# Patient Record
Sex: Female | Born: 1995 | Race: Black or African American | Hispanic: No | Marital: Single | Smoking: Former smoker
Health system: Southern US, Community
[De-identification: ages and names within clinical notes are randomized; demographics above are authoritative.]

## PROBLEM LIST (undated history)

## (undated) ENCOUNTER — Inpatient Hospital Stay (HOSPITAL_COMMUNITY): Payer: Self-pay

## (undated) DIAGNOSIS — D649 Anemia, unspecified: Secondary | ICD-10-CM

## (undated) DIAGNOSIS — F32A Depression, unspecified: Secondary | ICD-10-CM

## (undated) DIAGNOSIS — F99 Mental disorder, not otherwise specified: Secondary | ICD-10-CM

## (undated) DIAGNOSIS — F419 Anxiety disorder, unspecified: Secondary | ICD-10-CM

## (undated) DIAGNOSIS — F329 Major depressive disorder, single episode, unspecified: Secondary | ICD-10-CM

## (undated) HISTORY — DX: Depression, unspecified: F32.A

## (undated) HISTORY — DX: Anemia, unspecified: D64.9

## (undated) HISTORY — DX: Mental disorder, not otherwise specified: F99

## (undated) HISTORY — DX: Anxiety disorder, unspecified: F41.9

## (undated) HISTORY — PX: WISDOM TOOTH EXTRACTION: SHX21

---
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
## 2018-03-29 ENCOUNTER — Other Ambulatory Visit (HOSPITAL_COMMUNITY)
Admission: RE | Admit: 2018-03-29 | Discharge: 2018-03-29 | Disposition: A | Discharge status: Home / Self Care | Payer: Medicaid Other | Source: Ambulatory Visit | Attending: Certified Nurse Midwife | Admitting: Certified Nurse Midwife

## 2018-03-29 ENCOUNTER — Ambulatory Visit (INDEPENDENT_AMBULATORY_CARE_PROVIDER_SITE_OTHER): Payer: Medicaid Other | Admitting: Certified Nurse Midwife

## 2018-03-29 ENCOUNTER — Encounter: Payer: Self-pay | Admitting: Certified Nurse Midwife

## 2018-03-29 VITALS — BP 130/80 | HR 103 | Wt 119.0 lb

## 2018-03-29 DIAGNOSIS — A749 Chlamydial infection, unspecified: Secondary | ICD-10-CM | POA: Insufficient documentation

## 2018-03-29 DIAGNOSIS — O0932 Supervision of pregnancy with insufficient antenatal care, second trimester: Secondary | ICD-10-CM

## 2018-03-29 DIAGNOSIS — K219 Gastro-esophageal reflux disease without esophagitis: Secondary | ICD-10-CM

## 2018-03-29 DIAGNOSIS — Z3402 Encounter for supervision of normal first pregnancy, second trimester: Secondary | ICD-10-CM

## 2018-03-29 DIAGNOSIS — Z3400 Encounter for supervision of normal first pregnancy, unspecified trimester: Secondary | ICD-10-CM | POA: Insufficient documentation

## 2018-03-29 DIAGNOSIS — O219 Vomiting of pregnancy, unspecified: Secondary | ICD-10-CM

## 2018-03-29 DIAGNOSIS — O26892 Other specified pregnancy related conditions, second trimester: Secondary | ICD-10-CM

## 2018-03-29 DIAGNOSIS — B3731 Acute candidiasis of vulva and vagina: Secondary | ICD-10-CM

## 2018-03-29 DIAGNOSIS — O99619 Diseases of the digestive system complicating pregnancy, unspecified trimester: Secondary | ICD-10-CM

## 2018-03-29 DIAGNOSIS — B373 Candidiasis of vulva and vagina: Secondary | ICD-10-CM

## 2018-03-29 DIAGNOSIS — Z8659 Personal history of other mental and behavioral disorders: Secondary | ICD-10-CM | POA: Insufficient documentation

## 2018-03-29 DIAGNOSIS — B9689 Other specified bacterial agents as the cause of diseases classified elsewhere: Secondary | ICD-10-CM | POA: Insufficient documentation

## 2018-03-29 DIAGNOSIS — O0930 Supervision of pregnancy with insufficient antenatal care, unspecified trimester: Secondary | ICD-10-CM | POA: Insufficient documentation

## 2018-03-29 DIAGNOSIS — N898 Other specified noninflammatory disorders of vagina: Secondary | ICD-10-CM

## 2018-03-29 DIAGNOSIS — O99612 Diseases of the digestive system complicating pregnancy, second trimester: Secondary | ICD-10-CM

## 2018-03-29 MED ORDER — TERCONAZOLE 0.8 % VA CREA: 1.0000 | TOPICAL_CREAM | Freq: Every day | VAGINAL | 0 refills | Status: DC

## 2018-03-29 MED ORDER — FLUCONAZOLE 150 MG PO TABS: 150.0000 mg | ORAL_TABLET | Freq: Once | ORAL | 0 refills | Status: AC

## 2018-03-29 MED ORDER — TINIDAZOLE 500 MG PO TABS: 2.0000 g | ORAL_TABLET | Freq: Every day | ORAL | 0 refills | Status: DC

## 2018-03-29 MED ORDER — OMEPRAZOLE 20 MG PO CPDR: 20.0000 mg | DELAYED_RELEASE_CAPSULE | Freq: Two times a day (BID) | ORAL | 5 refills | Status: DC

## 2018-03-29 MED ORDER — OB COMPLETE PETITE 35-5-1-200 MG PO CAPS
1.0000 | ORAL_CAPSULE | Freq: Every day | ORAL | 12 refills | Status: DC
Start: 2018-03-29 — End: 2019-02-01

## 2018-03-29 MED ORDER — DOXYLAMINE-PYRIDOXINE 10-10 MG PO TBEC: DELAYED_RELEASE_TABLET | ORAL | 4 refills | Status: DC

## 2018-03-29 NOTE — Progress Notes (Signed)
Subjective:   Catherine Collins is a 22 y.o. G1P0 at [redacted]w[redacted]d by LMP being seen today for her first obstetrical visit.  Her obstetrical history is significant for hx of depression was on effexor and seraquil, stopped when she found out she was pregnant, denies depression symptoms today.  Hx of tobacco abuse prior to pregnancy as well. Patient does intend to breast feed. Pregnancy history fully reviewed.  Patient reports heartburn, nausea, no bleeding, no contractions, no cramping, no leaking, vaginal irritation and vomiting.  HISTORY: OB History  Gravida Para Term Preterm AB Living  1 0 0 0 0 0  SAB TAB Ectopic Multiple Live Births  0 0 0 0 0    # Outcome Date GA Lbr Len/2nd Weight Sex Delivery Anes PTL Lv  1 Current             Last pap smear was done n/a <21 years.   Past Medical History:  Diagnosis Date  . Medical history non-contributory   . Mental disorder    Past Surgical History:  Procedure Laterality Date  . NO PAST SURGERIES     Family History  Problem Relation Age of Onset  . Drug abuse Maternal Grandfather    Social History   Tobacco Use  . Smoking status: Current Every Day Smoker    Packs/day: 0.50    Types: Cigarettes  . Smokeless tobacco: Never Used  . Tobacco comment: stopped smoking after preg confirm  Substance Use Topics  . Alcohol use: No    Alcohol/week: 0.0 oz  . Drug use: No   No Known Allergies Current Outpatient Medications on File Prior to Visit  Medication Sig Dispense Refill  . QUEtiapine (SEROQUEL) 200 MG tablet Take 200 mg by mouth at bedtime.    Marland Kitchen venlafaxine XR (EFFEXOR-XR) 150 MG 24 hr capsule Take 150 mg by mouth daily with breakfast.     No current facility-administered medications on file prior to visit.     Review of Systems Pertinent items noted in HPI and remainder of comprehensive ROS otherwise negative.  Exam   Vitals:   03/29/18 1038  BP: 130/80  Pulse: (!) 103  Weight: 119 lb (54 kg)   Fetal Heart Rate (bpm):  160; doppler  Uterus:  Fundal Height: 17 cm  Pelvic Exam: Perineum: no hemorrhoids, normal perineum   Vulva: normal external genitalia, no lesions   Vagina:  normal mucosa, copious green/yello chunky discharge   Cervix: No CMT    Adnexa: normal adnexa and no mass, fullness, tenderness   Bony Pelvis: average  System: General: well-developed, well-nourished female in no acute distress   Breast:  normal appearance, no masses or tenderness   Skin: normal coloration and turgor, no rashes   Neurologic: oriented, normal, negative, normal mood   Extremities: normal strength, tone, and muscle mass, ROM of all joints is normal   HEENT PERRLA, extraocular movement intact and sclera clear, anicteric   Mouth/Teeth mucous membranes moist, pharynx normal without lesions and dental hygiene good   Neck supple and no masses   Cardiovascular: regular rate and rhythm   Respiratory:  no respiratory distress, normal breath sounds   Abdomen: soft, non-tender; bowel sounds normal; no masses,  no organomegaly     Assessment:   Pregnancy: G1P0 Patient Active Problem List   Diagnosis Date Noted  . History of depression 03/29/2018  . Supervision of normal first pregnancy, antepartum 03/29/2018  . Late prenatal care 03/29/2018     Plan:  1. Supervision of normal first pregnancy, antepartum    - Cervicovaginal ancillary only - Hemoglobinopathy evaluation - VITAMIN D 25 Hydroxy (Vit-D Deficiency, Fractures) - Culture, OB Urine - Obstetric Panel, Including HIV - Hemoglobin A1c - Genetic Screening - Inheritest Core(CF97,SMA,FraX) - [place] MFM OB COMP + 14 WK; Future - AFP, Serum, Open Spina Bifida - Prenat-FeCbn-FeAspGl-FA-Omega (OB COMPLETE PETITE) 35-5-1-200 MG CAPS; Take 1 tablet by mouth daily.  Dispense: 30 capsule; Refill: 12  2. History of depression      3. Late prenatal care    17 weeks  4. Nausea/vomiting in pregnancy      - Doxylamine-Pyridoxine (DICLEGIS) 10-10 MG TBEC; Take 1 tablet  with breakfast and lunch.  Take 2 tablets at bedtime.  Dispense: 100 tablet; Refill: 4  5. Gastroesophageal reflux in pregnancy    - omeprazole (PRILOSEC) 20 MG capsule; Take 1 capsule (20 mg total) by mouth 2 (two) times daily before a meal.  Dispense: 60 capsule; Refill: 5  6. Yeast vaginitis    - fluconazole (DIFLUCAN) 150 MG tablet; Take 1 tablet (150 mg total) by mouth once for 1 dose.  Dispense: 1 tablet; Refill: 0 - terconazole (TERAZOL 3) 0.8 % vaginal cream; Place 1 applicator vaginally at bedtime.  Dispense: 20 g; Refill: 0  7. Vaginal discharge during pregnancy in second trimester    - tinidazole (TINDAMAX) 500 MG tablet; Take 4 tablets (2,000 mg total) by mouth daily with breakfast.  Dispense: 12 tablet; Refill: 0   Initial labs drawn. Continue prenatal vitamins. Genetic Screening discussed, NIPS: ordered. Ultrasound discussed; fetal anatomic survey: ordered. Problem list reviewed and updated. The nature of [facility] with multiple MDs and other Advanced Practice Providers was explained to patient; also emphasized that residents, students are part of our team. Routine obstetric precautions reviewed. Return in about 1 month (around 04/26/2018) for ROB.     [facility], [facility]

## 2018-03-30 LAB — CERVICOVAGINAL ANCILLARY ONLY
BACTERIAL VAGINITIS: POSITIVE — AB
CANDIDA VAGINITIS: NEGATIVE
Chlamydia: POSITIVE — AB
NEISSERIA GONORRHEA: POSITIVE — AB
TRICH (WINDOWPATH): NEGATIVE

## 2018-03-31 LAB — OBSTETRIC PANEL, INCLUDING HIV
ANTIBODY SCREEN: NEGATIVE
BASOS: 0 %
Basophils Absolute: 0 10*3/uL (ref 0.0–0.2)
EOS (ABSOLUTE): 0.2 10*3/uL (ref 0.0–0.4)
EOS: 1 %
HEMATOCRIT: 35.3 % (ref 34.0–46.6)
HEMOGLOBIN: 11.9 g/dL (ref 11.1–15.9)
HEP B S AG: NEGATIVE
HIV Screen 4th Generation wRfx: NONREACTIVE
IMMATURE GRANS (ABS): 0 10*3/uL (ref 0.0–0.1)
IMMATURE GRANULOCYTES: 0 %
Lymphocytes Absolute: 1.4 10*3/uL (ref 0.7–3.1)
Lymphs: 11 %
MCH: 31.3 pg (ref 26.6–33.0)
MCHC: 33.7 g/dL (ref 31.5–35.7)
MCV: 93 fL (ref 79–97)
MONOS ABS: 0.8 10*3/uL (ref 0.1–0.9)
Monocytes: 6 %
NEUTROS PCT: 82 %
Neutrophils Absolute: 10.5 10*3/uL — ABNORMAL HIGH (ref 1.4–7.0)
Platelets: 289 10*3/uL (ref 150–379)
RBC: 3.8 x10E6/uL (ref 3.77–5.28)
RDW: 13.3 % (ref 12.3–15.4)
RH TYPE: POSITIVE
RPR: NONREACTIVE
RUBELLA: 1.19 {index} (ref >0.99)
WBC: 12.9 10*3/uL — ABNORMAL HIGH (ref 3.4–10.8)

## 2018-03-31 LAB — AFP, SERUM, OPEN SPINA BIFIDA
AFP MOM: 1.17
AFP VALUE AFPOSL: 62.9 ng/mL
Gest. Age on Collection Date: 17.9 weeks
Maternal Age At EDD: 20 yr
OSBR RISK 1 IN: 10000
Test Results:: NEGATIVE
WEIGHT: 119 [lb_av]

## 2018-03-31 LAB — HEMOGLOBIN A1C
Est. average glucose Bld gHb Est-mCnc: 94 mg/dL
Hgb A1c MFr Bld: 4.9 % (ref 4.8–5.6)

## 2018-03-31 LAB — HEMOGLOBINOPATHY EVALUATION
HGB C: 0 %
HGB S: 0 %
HGB VARIANT: 0 %
Hemoglobin A2 Quantitation: 2.4 % (ref 1.8–3.2)
Hemoglobin F Quantitation: 0 % (ref 0.0–2.0)
Hgb A: 97.6 % (ref 96.4–98.8)

## 2018-03-31 LAB — VITAMIN D 25 HYDROXY (VIT D DEFICIENCY, FRACTURES): Vit D, 25-Hydroxy: 24.3 ng/mL — ABNORMAL LOW (ref 30.0–100.0)

## 2018-04-01 ENCOUNTER — Telehealth: Payer: Self-pay

## 2018-04-01 ENCOUNTER — Other Ambulatory Visit: Payer: Self-pay | Admitting: Certified Nurse Midwife

## 2018-04-01 DIAGNOSIS — Z3400 Encounter for supervision of normal first pregnancy, unspecified trimester: Secondary | ICD-10-CM

## 2018-04-01 LAB — CULTURE, OB URINE

## 2018-04-01 LAB — URINE CULTURE, OB REFLEX

## 2018-04-01 NOTE — Telephone Encounter (Signed)
PA Approved for Diclegis  PA# [id number]

## 2018-04-06 ENCOUNTER — Telehealth: Payer: Self-pay | Admitting: *Deleted

## 2018-04-06 NOTE — Telephone Encounter (Signed)
PA for Diclegis and Tinidazole have been submitted and approved. Pharmacy aware.

## 2018-04-07 ENCOUNTER — Other Ambulatory Visit: Payer: Self-pay | Admitting: Certified Nurse Midwife

## 2018-04-07 ENCOUNTER — Other Ambulatory Visit: Payer: Self-pay | Admitting: *Deleted

## 2018-04-07 DIAGNOSIS — O285 Abnormal chromosomal and genetic finding on antenatal screening of mother: Secondary | ICD-10-CM

## 2018-04-07 DIAGNOSIS — Z3400 Encounter for supervision of normal first pregnancy, unspecified trimester: Secondary | ICD-10-CM

## 2018-04-07 LAB — INHERITEST CORE(CF97,SMA,FRAX)

## 2018-04-08 ENCOUNTER — Ambulatory Visit (HOSPITAL_COMMUNITY)
Admission: RE | Admit: 2018-04-08 | Discharge: 2018-04-08 | Disposition: A | Discharge status: Home / Self Care | Payer: Medicaid Other | Source: Ambulatory Visit | Attending: Certified Nurse Midwife | Admitting: Certified Nurse Midwife

## 2018-04-08 ENCOUNTER — Encounter (HOSPITAL_COMMUNITY): Payer: Self-pay

## 2018-04-08 ENCOUNTER — Other Ambulatory Visit: Payer: Self-pay | Admitting: Certified Nurse Midwife

## 2018-04-08 DIAGNOSIS — O285 Abnormal chromosomal and genetic finding on antenatal screening of mother: Secondary | ICD-10-CM

## 2018-04-08 DIAGNOSIS — Z315 Encounter for genetic counseling: Secondary | ICD-10-CM | POA: Diagnosis present

## 2018-04-08 DIAGNOSIS — Z363 Encounter for antenatal screening for malformations: Secondary | ICD-10-CM | POA: Insufficient documentation

## 2018-04-08 DIAGNOSIS — Z3A15 15 weeks gestation of pregnancy: Secondary | ICD-10-CM | POA: Diagnosis not present

## 2018-04-08 DIAGNOSIS — O289 Unspecified abnormal findings on antenatal screening of mother: Secondary | ICD-10-CM

## 2018-04-11 ENCOUNTER — Other Ambulatory Visit: Payer: Self-pay | Admitting: Certified Nurse Midwife

## 2018-04-11 DIAGNOSIS — O289 Unspecified abnormal findings on antenatal screening of mother: Secondary | ICD-10-CM | POA: Insufficient documentation

## 2018-04-11 DIAGNOSIS — Z3A19 19 weeks gestation of pregnancy: Secondary | ICD-10-CM | POA: Insufficient documentation

## 2018-04-11 DIAGNOSIS — Z3400 Encounter for supervision of normal first pregnancy, unspecified trimester: Secondary | ICD-10-CM

## 2018-04-11 NOTE — Progress Notes (Signed)
Genetic Counseling  High-Risk Gestation Note  Appointment Date:  04/08/2018 Referred By: Roe Coombs, CNM Date of Birth:  11/21/1996   Pregnancy History: G1P0 Estimated Date of Delivery: 08/31/18 Estimated Gestational Age: [redacted]w[redacted]d Attending: Particia Nearing, MD   [name] was seen for genetic counseling because of no results on noninvasive prenatal screening (NIPS)/prenatal cell free DNA testing due to uninformative DNA pattern. [name] is 22 y.o..      In summary:  Discussed uninformative NIPS (Panorama) result  Reviewed causes and alternative screening options ? NIPS using counting methodology-MaterniT 21 performed today  Discussed diagnostic testing- amniocentesis declined  Ultrasound performed today- see separate report  Discussed general population carrier screening options ? CF, SMA, and hemoglobinopathies-previously performed and screen neg  Catherine Collins had noninvasive prenatal screening (NIPS) through her referring provider's office. Specifically, she had Panorama screening through [place]. A result was not provided due to an uninformative DNA pattern. We spent time discussing genes, chromosomes, and examples of chromosome conditions. We reviewed NIPS technology and that the accuracy of this testing relies on the ability to distinguish specific DNA sequence variations between the mother and the fetus (placental cell free DNA). We discussed that in some cases, it is difficult to distinguish a difference between maternal and placental DNA sequences, making it difficult to interpret the results. This can happen because the mother and the fetus share more DNA sequences in common than expected, possibly due to general population variation or in cases of consanguinity for couples. Uninformative DNA patterns can also be observed when there is an underlying chromosome/genetic condition in either the pregnancy or the woman or due to limitations of the testing  algorithm.   She was counseled regarding maternal age and the association with risk for chromosome conditions due to nondisjunction with aging of the ova.   We reviewed chromosomes, nondisjunction, and the associated risk for fetal aneuploidy related to a maternal age of 22 y.o..  She was counseled that the risk for aneuploidy decreases as gestational age increases, accounting for those pregnancies which spontaneously abort.  We specifically discussed Down syndrome (trisomy 20), trisomies 22 and 59, and sex chromosome aneuploidies (47,XXX and 47,XXY) including the common features and prognoses of each.   We reviewed that [facility] uses a Therapist, art Programmer, multimedia) and that other laboratories [employer], [employer] etc) use a Psychologist, clinical (counting), where the individual fragments of DNA are analyzed in slightly different ways. We discussed additional screening for aneuploidy utilizing a different NIPS technology and detailed anatomy ultrasound. We reviewed the limitations and benefits of each of these options. We also discussed the option of diagnostic testing via amniocentesis. We reviewed the associated risks, benefits andlimitationsof amniocentesis, including the associated risk for complications.  We discussed the possible results that the tests might provide including: positive, negative, unanticipated, and no result.  After careful consideration, Catherine Collins elected to pursue NIPS through different methodology (MaterniT21 through [employer]) and declined amniocentesis. MaterniT21 results are expected in approximately 7-10 days.  She elected to proceed with detailed ultrasound at today's visit.   A complete ultrasound was performed today. The ultrasound report will be sent under separate cover. There were no visualized fetal anomalies or markers suggestive of aneuploidy. Diagnostic testing was declined today.  She understands that screening tests cannot rule out all birth defects or  genetic syndromes.    Catherine Collins  was provided with written information regarding cystic fibrosis (CF), spinal muscular atrophy (SMA) and hemoglobinopathies  including the carrier frequency, availability of carrier screening and prenatal diagnosis if indicated.  In addition, we discussed that CF and hemoglobinopathies are routinely screened for as part of the [place] newborn screening panel. The patient previously had carrier screening performed for these conditions and Fragile X through her Ob provider, and all were within normal limits.   Both family histories were reviewed and found to be noncontributory for birth defects, intellectual disability, and known genetic conditions. African American ancestry was reported for the patient and her partner. The couple reported no known consanguinity. Without further information regarding the provided family history, an accurate genetic risk cannot be calculated. Further genetic counseling is warranted if more information is obtained.  Catherine Collins denied exposure to environmental toxins or chemical agents. She denied the use of alcohol, tobacco or street drugs. She denied significant viral illnesses during the course of her pregnancy.   I counseled Catherine Collins regarding the above risks and available options.  The approximate face-to-face time with the genetic counselor was 40 minutes.  [name], MS,  Certified Genetic Counselor 04/11/2018

## 2018-04-13 ENCOUNTER — Other Ambulatory Visit: Payer: Self-pay | Admitting: Certified Nurse Midwife

## 2018-04-13 DIAGNOSIS — Z3400 Encounter for supervision of normal first pregnancy, unspecified trimester: Secondary | ICD-10-CM

## 2018-04-18 ENCOUNTER — Other Ambulatory Visit (HOSPITAL_COMMUNITY): Payer: Self-pay

## 2018-04-18 ENCOUNTER — Telehealth (HOSPITAL_COMMUNITY): Payer: Self-pay | Admitting: MS"

## 2018-04-18 NOTE — Telephone Encounter (Signed)
CallDELANDA BULLUCKFraser to discuss her prenatal cell free DNA test results.  [name] had MaterniT21 through [employer].  Testing was offered because of uninformative DNA pattern on previous SNP-based NIPS (Panorama).   The patient was identified by name and DOB.  We reviewed that these are within normal limits/screen negative for aneuploidy for chromosomes 21, 18, 13, and X.  We reviewed that this testing identifies > 99% of pregnancies with trisomy 80, trisomy 23, and approximately 91% of pregnancies with trisomy 30.  Testing was also consistent with female fetal sex.  The patient did wish to know fetal sex.  She understands that this testing does not identify all genetic conditions.  All questions were answered to her satisfaction, she was encouraged to call with additional questions or concerns.  [name], MS Certified Genetic Counselor 04/18/2018 11:52 AM

## 2018-04-18 NOTE — Telephone Encounter (Signed)
Attempted to contact patient regarding results of MaterniT21 (NIPS), which are within normal limits. Left message for patient to return call.    Clydie Braun Shaunak Kreis  04/18/2018 11:04 AM

## 2018-04-20 NOTE — Addendum Note (Signed)
Encounter addended by: Heidi Dach, RN on: 04/20/2018 1:56 PM  Actions taken: OB Dating navigator section updated

## 2018-04-26 ENCOUNTER — Ambulatory Visit (INDEPENDENT_AMBULATORY_CARE_PROVIDER_SITE_OTHER): Payer: Medicaid Other | Admitting: Certified Nurse Midwife

## 2018-04-26 DIAGNOSIS — Z3400 Encounter for supervision of normal first pregnancy, unspecified trimester: Secondary | ICD-10-CM

## 2018-04-26 NOTE — Progress Notes (Signed)
   PRENATAL VISIT NOTE  Subjective:  Catherine Collins is a 22 y.o. G1P0 at [redacted]w[redacted]d being seen today for ongoing prenatal care.  She is currently monitored for the following issues for this low-risk pregnancy and has History of depression; Supervision of normal first pregnancy, antepartum; Late prenatal care; and Abnormal antenatal test on their problem list.  Patient reports no complaints.  Contractions: Not present. Vag. Bleeding: None.  Movement: Present. Denies leaking of fluid.   The following portions of the patient's history were reviewed and updated as appropriate: allergies, current medications, past family history, past medical history, past social history, past surgical history and problem list. Problem list updated.  Objective:   Vitals:   04/26/18 1046  BP: 124/78  Pulse: (!) 110  Weight: 120 lb 4.8 oz (54.6 kg)    Fetal Status: Fetal Heart Rate (bpm): 155; doppler   Movement: Present     General:  Alert, oriented and cooperative. Patient is in no acute distress.  Skin: Skin is warm and dry. No rash noted.   Cardiovascular: Normal heart rate noted  Respiratory: Normal respiratory effort, no problems with respiration noted  Abdomen: Soft, gravid, appropriate for gestational age.  Pain/Pressure: Absent     Pelvic: Cervical exam deferred        Extremities: Normal range of motion.  Edema: None  Mental Status: Normal mood and affect. Normal behavior. Normal judgment and thought content.   Assessment and Plan:  Pregnancy: G1P0 at [redacted]w[redacted]d  1. Supervision of normal first pregnancy, antepartum      Doing well.  Normal Mat21.  F/U US scheduled.  Dating previously changed.  Patient aware of new due date.    Preterm labor symptoms and general obstetric precautions including but not limited to vaginal bleeding, contractions, leaking of fluid and fetal movement were reviewed in detail with the patient. Please refer to After Visit Summary for other counseling recommendations.  Return  in about 1 month (around 05/24/2018) for ROB.  Future Appointments  Date Time Provider Department Center  05/16/2018  1:15 PM [facility] [place] 4 [facility]    Roe Coombs, CNM

## 2018-05-16 ENCOUNTER — Other Ambulatory Visit: Payer: Self-pay | Admitting: Certified Nurse Midwife

## 2018-05-16 ENCOUNTER — Ambulatory Visit (HOSPITAL_COMMUNITY)
Admission: RE | Admit: 2018-05-16 | Discharge: 2018-05-16 | Disposition: A | Discharge status: Home / Self Care | Payer: Medicaid Other | Source: Ambulatory Visit | Attending: Certified Nurse Midwife | Admitting: Certified Nurse Midwife

## 2018-05-16 DIAGNOSIS — Z0489 Encounter for examination and observation for other specified reasons: Secondary | ICD-10-CM

## 2018-05-16 DIAGNOSIS — Z3A20 20 weeks gestation of pregnancy: Secondary | ICD-10-CM

## 2018-05-16 DIAGNOSIS — IMO0002 Reserved for concepts with insufficient information to code with codable children: Secondary | ICD-10-CM

## 2018-05-16 DIAGNOSIS — Z362 Encounter for other antenatal screening follow-up: Secondary | ICD-10-CM | POA: Diagnosis not present

## 2018-05-16 DIAGNOSIS — Z3400 Encounter for supervision of normal first pregnancy, unspecified trimester: Secondary | ICD-10-CM

## 2018-05-17 ENCOUNTER — Other Ambulatory Visit: Payer: Self-pay | Admitting: Certified Nurse Midwife

## 2018-05-17 DIAGNOSIS — Z3400 Encounter for supervision of normal first pregnancy, unspecified trimester: Secondary | ICD-10-CM

## 2018-05-20 ENCOUNTER — Telehealth: Payer: Self-pay | Admitting: Licensed Clinical Social Worker

## 2018-05-20 NOTE — Telephone Encounter (Signed)
Patient called. Appt confirmed

## 2018-05-24 ENCOUNTER — Ambulatory Visit (INDEPENDENT_AMBULATORY_CARE_PROVIDER_SITE_OTHER): Payer: Medicaid Other | Admitting: Certified Nurse Midwife

## 2018-05-24 VITALS — BP 112/75 | HR 105 | Wt 126.0 lb

## 2018-05-24 DIAGNOSIS — O98812 Other maternal infectious and parasitic diseases complicating pregnancy, second trimester: Secondary | ICD-10-CM

## 2018-05-24 DIAGNOSIS — B9689 Other specified bacterial agents as the cause of diseases classified elsewhere: Secondary | ICD-10-CM

## 2018-05-24 DIAGNOSIS — O98212 Gonorrhea complicating pregnancy, second trimester: Secondary | ICD-10-CM

## 2018-05-24 DIAGNOSIS — A749 Chlamydial infection, unspecified: Secondary | ICD-10-CM | POA: Insufficient documentation

## 2018-05-24 DIAGNOSIS — Z3400 Encounter for supervision of normal first pregnancy, unspecified trimester: Secondary | ICD-10-CM

## 2018-05-24 DIAGNOSIS — N760 Acute vaginitis: Secondary | ICD-10-CM

## 2018-05-24 DIAGNOSIS — Z3402 Encounter for supervision of normal first pregnancy, second trimester: Secondary | ICD-10-CM

## 2018-05-24 MED ORDER — TERCONAZOLE 0.8 % VA CREA: 1.0000 | TOPICAL_CREAM | Freq: Every day | VAGINAL | 0 refills | Status: DC

## 2018-05-24 MED ORDER — AZITHROMYCIN 250 MG PO TABS: ORAL_TABLET | ORAL | 0 refills | Status: DC

## 2018-05-24 MED ORDER — METRONIDAZOLE 500 MG PO TABS
500.0000 mg | ORAL_TABLET | Freq: Two times a day (BID) | ORAL | 0 refills | Status: DC
Start: 2018-05-24 — End: 2018-06-21

## 2018-05-24 MED ORDER — CEFTRIAXONE SODIUM 250 MG IJ SOLR
250.0000 mg | Freq: Once | INTRAMUSCULAR | Status: AC
  Administered 2018-05-24: 250 mg via INTRAMUSCULAR

## 2018-05-24 NOTE — Patient Instructions (Signed)
Bacterial Vaginosis Bacterial vaginosis is a vaginal infection that occurs when the normal balance of bacteria in the vagina is disrupted. It results from an overgrowth of certain bacteria. This is the most common vaginal infection among women ages 15-44. Because bacterial vaginosis increases your risk for STIs (sexually transmitted infections), getting treated can help reduce your risk for chlamydia, gonorrhea, herpes, and HIV (human immunodeficiency virus). Treatment is also important for preventing complications in pregnant women, because this condition can cause an early (premature) delivery. What are the causes? This condition is caused by an increase in harmful bacteria that are normally present in small amounts in the vagina. However, the reason that the condition develops is not fully understood. What increases the risk? The following factors may make you more likely to develop this condition:  Having a new sexual partner or multiple sexual partners.  Having unprotected sex.  Douching.  Having an intrauterine device (IUD).  Smoking.  Drug and alcohol abuse.  Taking certain antibiotic medicines.  Being pregnant.  You cannot get bacterial vaginosis from toilet seats, bedding, swimming pools, or contact with objects around you. What are the signs or symptoms? Symptoms of this condition include:  Grey or white vaginal discharge. The discharge can also be watery or foamy.  A fish-like odor with discharge, especially after sexual intercourse or during menstruation.  Itching in and around the vagina.  Burning or pain with urination.  Some women with bacterial vaginosis have no signs or symptoms. How is this diagnosed? This condition is diagnosed based on:  Your medical history.  A physical exam of the vagina.  Testing a sample of vaginal fluid under a microscope to look for a large amount of bad bacteria or abnormal cells. Your health care provider may use a cotton swab  or a small wooden spatula to collect the sample.  How is this treated? This condition is treated with antibiotics. These may be given as a pill, a vaginal cream, or a medicine that is put into the vagina (suppository). If the condition comes back after treatment, a second round of antibiotics may be needed. Follow these instructions at home: Medicines  Take over-the-counter and prescription medicines only as told by your health care provider.  Take or use your antibiotic as told by your health care provider. Do not stop taking or using the antibiotic even if you start to feel better. General instructions  If you have a female sexual partner, tell her that you have a vaginal infection. She should see her health care provider and be treated if she has symptoms. If you have a female sexual partner, he does not need treatment.  During treatment: ? Avoid sexual activity until you finish treatment. ? Do not douche. ? Avoid alcohol as directed by your health care provider. ? Avoid breastfeeding as directed by your health care provider.  Drink enough water and fluids to keep your urine clear or pale yellow.  Keep the area around your vagina and rectum clean. ? Wash the area daily with warm water. ? Wipe yourself from front to back after using the toilet.  Keep all follow-up visits as told by your health care provider. This is important. How is this prevented?  Do not douche.  Wash the outside of your vagina with warm water only.  Use protection when having sex. This includes latex condoms and dental dams.  Limit how many sexual partners you have. To help prevent bacterial vaginosis, it is best to have sex with just   one partner (monogamous).  Make sure you and your sexual partner are tested for STIs.  Wear cotton or cotton-lined underwear.  Avoid wearing tight pants and pantyhose, especially during summer.  Limit the amount of alcohol that you drink.  Do not use any products that  contain nicotine or tobacco, such as cigarettes and e-cigarettes. If you need help quitting, ask your health care provider.  Do not use illegal drugs. Where to find more information:  Centers for Disease Control and Prevention: www.cdc.gov/std  American Sexual Health Association (ASHA): www.ashastd.org  U.[place] Department of Health and Human Services, Office on Women's Health: www.womenshealth.gov/ or https://www.womenshealth.gov/a-z-topics/bacterial-vaginosis Contact a health care provider if:  Your symptoms do not improve, even after treatment.  You have more discharge or pain when urinating.  You have a fever.  You have pain in your abdomen.  You have pain during sex.  You have vaginal bleeding between periods. Summary  Bacterial vaginosis is a vaginal infection that occurs when the normal balance of bacteria in the vagina is disrupted.  Because bacterial vaginosis increases your risk for STIs (sexually transmitted infections), getting treated can help reduce your risk for chlamydia, gonorrhea, herpes, and HIV (human immunodeficiency virus). Treatment is also important for preventing complications in pregnant women, because the condition can cause an early (premature) delivery.  This condition is treated with antibiotic medicines. These may be given as a pill, a vaginal cream, or a medicine that is put into the vagina (suppository). This information is not intended to replace advice given to you by your health care provider. Make sure you discuss any questions you have with your health care provider. Document Released: 11/16/2005 Document Revised: 03/22/2017 Document Reviewed: 08/01/2016 Elsevier Interactive Patient Education  2018 Elsevier Inc.  

## 2018-05-24 NOTE — Addendum Note (Signed)
Addended byFrutoso Chase: [facility], Othar Curto on: 05/24/2018 10:53 AM   Modules accepted: Orders

## 2018-05-24 NOTE — Addendum Note (Signed)
Addended by: Orvilla CornwallENNEY, Emylie Amster A on: 05/24/2018 10:47 AM   Modules accepted: Orders

## 2018-05-24 NOTE — Progress Notes (Addendum)
   PRENATAL VISIT NOTE  Subjective:  Catherine Collins Reason is a 22 y.o. G1P0 at 4028w0d being seen today for ongoing prenatal care.  She is currently monitored for the following issues for this low-risk pregnancy and has History of depression; Supervision of normal first pregnancy, antepartum; Late prenatal care; Chlamydia infection affecting pregnancy in second trimester; and Gonorrhea affecting pregnancy in second trimester on their problem list.  Patient reports no bleeding, no contractions, no cramping, no leaking and vaginal irritation.  Contractions: Not present. Vag. Bleeding: None.  Movement: Present. Denies leaking of fluid.   The following portions of the patient's history were reviewed and updated as appropriate: allergies, current medications, past family history, past medical history, past social history, past surgical history and problem list. Problem list updated.  Objective:   Vitals:   05/24/18 1017  BP: 112/75  Pulse: (!) 105  Weight: 126 lb (57.2 kg)    Fetal Status: Fetal Heart Rate (bpm): 152; doppler Fundal Height: 22 cm Movement: Present     General:  Alert, oriented and cooperative. Patient is in no acute distress.  Skin: Skin is warm and dry. No rash noted.   Cardiovascular: Normal heart rate noted  Respiratory: Normal respiratory effort, no problems with respiration noted  Abdomen: Soft, gravid, appropriate for gestational age.  Pain/Pressure: Absent     Pelvic: Cervical exam deferred        Extremities: Normal range of motion.  Edema: None  Mental Status: Normal mood and affect. Normal behavior. Normal judgment and thought content.   Assessment and Plan:  Pregnancy: G1P0 at 5928w0d  1. Chlamydia infection affecting pregnancy in second trimester     TOC in 4 weeks - azithromycin (ZITHROMAX) 250 MG tablet; Take 4 tablets all together now.  Dispense: 4 tablet; Refill: 0  2. Supervision of normal first pregnancy, antepartum       3. Gonorrhea affecting pregnancy  in second trimester     TOC in 4 weeks - azithromycin (ZITHROMAX) 250 MG tablet; Take 4 tablets all together now.  Dispense: 4 tablet; Refill: 0  Rocephin given in office.   4. BV (bacterial vaginosis)     Did not keep Tindamax down, re-treatment.   - metroNIDAZOLE (FLAGYL) 500 MG tablet; Take 1 tablet (500 mg total) by mouth 2 (two) times daily.  Dispense: 14 tablet; Refill: 0  Preterm labor symptoms and general obstetric precautions including but not limited to vaginal bleeding, contractions, leaking of fluid and fetal movement were reviewed in detail with the patient. Please refer to After Visit Summary for other counseling recommendations.  Return in about 1 month (around 06/21/2018) for ROB/TOC.  Future Appointments  Date Time Provider Department Center  06/21/2018 10:45 AM Roe Coombsenney, Alexey Rhoads A, CNM [facility] None    Roe Coombsachelle A Kharlie Bring, CNM

## 2018-06-21 ENCOUNTER — Other Ambulatory Visit (HOSPITAL_COMMUNITY)
Admission: RE | Admit: 2018-06-21 | Discharge: 2018-06-21 | Disposition: A | Discharge status: Home / Self Care | Payer: Medicaid Other | Source: Ambulatory Visit | Attending: Obstetrics | Admitting: Obstetrics

## 2018-06-21 ENCOUNTER — Ambulatory Visit (INDEPENDENT_AMBULATORY_CARE_PROVIDER_SITE_OTHER): Payer: Medicaid Other | Admitting: Obstetrics

## 2018-06-21 ENCOUNTER — Encounter: Payer: Self-pay | Admitting: Obstetrics

## 2018-06-21 VITALS — BP 127/74 | HR 99 | Wt 134.0 lb

## 2018-06-21 DIAGNOSIS — Z202 Contact with and (suspected) exposure to infections with a predominantly sexual mode of transmission: Secondary | ICD-10-CM

## 2018-06-21 DIAGNOSIS — N898 Other specified noninflammatory disorders of vagina: Secondary | ICD-10-CM | POA: Insufficient documentation

## 2018-06-21 DIAGNOSIS — Z3400 Encounter for supervision of normal first pregnancy, unspecified trimester: Secondary | ICD-10-CM

## 2018-06-21 DIAGNOSIS — K219 Gastro-esophageal reflux disease without esophagitis: Secondary | ICD-10-CM

## 2018-06-21 DIAGNOSIS — Z3402 Encounter for supervision of normal first pregnancy, second trimester: Secondary | ICD-10-CM

## 2018-06-21 DIAGNOSIS — Z8659 Personal history of other mental and behavioral disorders: Secondary | ICD-10-CM

## 2018-06-21 MED ORDER — OMEPRAZOLE 20 MG PO CPDR: 20.0000 mg | DELAYED_RELEASE_CAPSULE | Freq: Two times a day (BID) | ORAL | 5 refills | Status: DC

## 2018-06-21 NOTE — Progress Notes (Addendum)
Subjective:  Catherine Collins is a 22 y.o. G1P0 at 8817w0d being seen today for ongoing prenatal care.  She is currently monitored for the following issues for this high-risk pregnancy and has History of depression; Supervision of normal first pregnancy, antepartum; Late prenatal care; Chlamydia infection affecting pregnancy in second trimester; and Gonorrhea affecting pregnancy in second trimester on their problem list.  Patient reports heartburn.  Contractions: Not present. Vag. Bleeding: None.  Movement: Present. Denies leaking of fluid.   The following portions of the patient's history were reviewed and updated as appropriate: allergies, current medications, past family history, past medical history, past social history, past surgical history and problem list. Problem list updated.  Objective:   Vitals:   06/21/18 1055  BP: 127/74  Pulse: 99  Weight: 134 lb (60.8 kg)    Fetal Status: Fetal Heart Rate (bpm): 150   Movement: Present     General:  Alert, oriented and cooperative. Patient is in no acute distress.  Skin: Skin is warm and dry. No rash noted.   Cardiovascular: Normal heart rate noted  Respiratory: Normal respiratory effort, no problems with respiration noted  Abdomen: Soft, gravid, appropriate for gestational age. Pain/Pressure: Absent     Pelvic:  Cervical exam deferred        Extremities: Normal range of motion.     Mental Status: Normal mood and affect. Normal behavior. Normal judgment and thought content.   Urinalysis:      Assessment and Plan:  Pregnancy: G1P0 at 5717w0d  1. Supervision of normal first pregnancy, antepartum  2. Vaginal discharge Rx: - Cervicovaginal ancillary only  3. GERD without esophagitis Rx: - omeprazole (PRILOSEC) 20 MG capsule; Take 1 capsule (20 mg total) by mouth 2 (two) times daily before a meal.  Dispense: 60 capsule; Refill: 5  4. Chlamydia contact, treated - TOC culture done today  5. Gonorrhea contact, treated - TOC culture  done today  6. History of depression - stable   Preterm labor symptoms and general obstetric precautions including but not limited to vaginal bleeding, contractions, leaking of fluid and fetal movement were reviewed in detail with the patient. Please refer to After Visit Summary for other counseling recommendations.  Return in about 2 weeks (around 07/05/2018) for ROB.   Brock BadHarper, Jennavecia Schwier A, MD

## 2018-06-21 NOTE — Progress Notes (Signed)
Subjective: Catherine Collins is a G1P0 at 3162w0d [facility]  She does have a history of any mental health concerns. She is currently sexually active. She is currently using nothing for birth control. She has had recent STD screening on 03/29/2018 and was tested positive for gonorrhea, chlamydia, and BV.  Patient states father of baby and maternal grandmother as her support system.   BP 127/74   Pulse 99   Wt 134 lb (60.8 kg)   LMP 11/24/2017   Birth Control History: Catherine GriefSkyla however it was surgical removed per patient.    MDM Patient counseled on all options for birth control today including LARC. Patient desires further counseling. .   Assessment:  22 y.o. female considering additional counseling for birth control  Plan:  Further contraception education and counseling.  [facility] referral   Catherine Collins, Catherine Collins, Catherine Collins 06/21/2018 3:24 PM

## 2018-06-22 LAB — CERVICOVAGINAL ANCILLARY ONLY
Bacterial vaginitis: NEGATIVE
CHLAMYDIA, DNA PROBE: NEGATIVE
Candida vaginitis: NEGATIVE
Neisseria Gonorrhea: NEGATIVE
Trichomonas: NEGATIVE

## 2018-07-05 ENCOUNTER — Encounter: Payer: Self-pay | Admitting: Obstetrics

## 2018-07-05 ENCOUNTER — Other Ambulatory Visit: Payer: Self-pay

## 2018-07-05 ENCOUNTER — Other Ambulatory Visit: Payer: Medicaid Other

## 2018-07-05 ENCOUNTER — Ambulatory Visit (INDEPENDENT_AMBULATORY_CARE_PROVIDER_SITE_OTHER): Payer: Medicaid Other | Admitting: Obstetrics

## 2018-07-05 VITALS — BP 104/59 | HR 87 | Wt 135.2 lb

## 2018-07-05 DIAGNOSIS — Z3400 Encounter for supervision of normal first pregnancy, unspecified trimester: Secondary | ICD-10-CM

## 2018-07-05 DIAGNOSIS — Z3403 Encounter for supervision of normal first pregnancy, third trimester: Secondary | ICD-10-CM

## 2018-07-05 NOTE — Progress Notes (Signed)
ROB/GTT.  TDAP deferred until next visit.

## 2018-07-05 NOTE — Progress Notes (Signed)
Subjective:  Catherine Collins is a 22 y.o. G1P0 at 2573w0d being seen today for ongoing prenatal care.  She is currently monitored for the following issues for this low-risk pregnancy and has History of depression; Supervision of normal first pregnancy, antepartum; Late prenatal care; Chlamydia infection affecting pregnancy in second trimester; and Gonorrhea affecting pregnancy in second trimester on their problem list.  Patient reports no complaints.  Contractions: Not present. Vag. Bleeding: None.  Movement: Present. Denies leaking of fluid.   The following portions of the patient's history were reviewed and updated as appropriate: allergies, current medications, past family history, past medical history, past social history, past surgical history and problem list. Problem list updated.  Objective:   Vitals:   07/05/18 0901  BP: (!) 104/59  Pulse: 87  Weight: 135 lb 3.2 oz (61.3 kg)    Fetal Status: Fetal Heart Rate (bpm): 150   Movement: Present     General:  Alert, oriented and cooperative. Patient is in no acute distress.  Skin: Skin is warm and dry. No rash noted.   Cardiovascular: Normal heart rate noted  Respiratory: Normal respiratory effort, no problems with respiration noted  Abdomen: Soft, gravid, appropriate for gestational age. Pain/Pressure: Absent     Pelvic:  Cervical exam deferred        Extremities: Normal range of motion.  Edema: None  Mental Status: Normal mood and affect. Normal behavior. Normal judgment and thought content.   Urinalysis:      Assessment and Plan:  Pregnancy: G1P0 at 3773w0d  1. Supervision of normal first pregnancy, antepartum Rx: - Glucose Tolerance, 2 Hours w/1 Hour - CBC - HIV antibody (with reflex) - RPR - Tdap vaccine greater than or equal to 7yo IM  Preterm labor symptoms and general obstetric precautions including but not limited to vaginal bleeding, contractions, leaking of fluid and fetal movement were reviewed in detail with the  patient. Please refer to After Visit Summary for other counseling recommendations.  Return in about 2 weeks (around 07/19/2018) for ROB.   Brock BadHarper, Cigi Bega A, MD

## 2018-07-06 LAB — GLUCOSE TOLERANCE, 2 HOURS W/ 1HR
GLUCOSE, 1 HOUR: 90 mg/dL (ref 65–179)
Glucose, 2 hour: 82 mg/dL (ref 65–152)
Glucose, Fasting: 72 mg/dL (ref 65–91)

## 2018-07-06 LAB — CBC
Hematocrit: 34.9 % (ref 34.0–46.6)
Hemoglobin: 11.5 g/dL (ref 11.1–15.9)
MCH: 30.9 pg (ref 26.6–33.0)
MCHC: 33 g/dL (ref 31.5–35.7)
MCV: 94 fL (ref 79–97)
Platelets: 236 10*3/uL (ref 150–450)
RBC: 3.72 x10E6/uL — AB (ref 3.77–5.28)
RDW: 13.2 % (ref 12.3–15.4)
WBC: 11.9 10*3/uL — AB (ref 3.4–10.8)

## 2018-07-06 LAB — RPR: RPR Ser Ql: NONREACTIVE

## 2018-07-06 LAB — HIV ANTIBODY (ROUTINE TESTING W REFLEX): HIV SCREEN 4TH GENERATION: NONREACTIVE

## 2018-07-11 ENCOUNTER — Inpatient Hospital Stay (HOSPITAL_COMMUNITY)
Admission: AD | Admit: 2018-07-11 | Discharge: 2018-07-12 | Disposition: A | Discharge status: Home / Self Care | Payer: Medicaid Other | Source: Ambulatory Visit | Attending: Obstetrics and Gynecology | Admitting: Obstetrics and Gynecology

## 2018-07-11 ENCOUNTER — Encounter (HOSPITAL_COMMUNITY): Payer: Self-pay

## 2018-07-11 DIAGNOSIS — O26893 Other specified pregnancy related conditions, third trimester: Secondary | ICD-10-CM | POA: Diagnosis present

## 2018-07-11 DIAGNOSIS — M791 Myalgia, unspecified site: Secondary | ICD-10-CM | POA: Diagnosis not present

## 2018-07-11 DIAGNOSIS — Z3689 Encounter for other specified antenatal screening: Secondary | ICD-10-CM

## 2018-07-11 DIAGNOSIS — Z3A29 29 weeks gestation of pregnancy: Secondary | ICD-10-CM | POA: Diagnosis not present

## 2018-07-11 DIAGNOSIS — O99333 Smoking (tobacco) complicating pregnancy, third trimester: Secondary | ICD-10-CM | POA: Diagnosis not present

## 2018-07-11 DIAGNOSIS — F17210 Nicotine dependence, cigarettes, uncomplicated: Secondary | ICD-10-CM | POA: Diagnosis not present

## 2018-07-11 DIAGNOSIS — Z79899 Other long term (current) drug therapy: Secondary | ICD-10-CM | POA: Diagnosis not present

## 2018-07-11 DIAGNOSIS — R109 Unspecified abdominal pain: Secondary | ICD-10-CM | POA: Diagnosis present

## 2018-07-11 LAB — URINALYSIS, ROUTINE W REFLEX MICROSCOPIC
BILIRUBIN URINE: NEGATIVE
Glucose, UA: NEGATIVE mg/dL
Hgb urine dipstick: NEGATIVE
Ketones, ur: NEGATIVE mg/dL
LEUKOCYTES UA: NEGATIVE
Nitrite: NEGATIVE
Protein, ur: NEGATIVE mg/dL
SPECIFIC GRAVITY, URINE: 1.015 (ref 1.005–1.030)
pH: 7 (ref 5.0–8.0)

## 2018-07-11 NOTE — MAU Note (Signed)
Started having lower abdominal cramping around 1800 tonight.  No LOF/VB.  + FM.  No complications with the pregnancy.

## 2018-07-12 DIAGNOSIS — R109 Unspecified abdominal pain: Secondary | ICD-10-CM

## 2018-07-12 DIAGNOSIS — Z3A29 29 weeks gestation of pregnancy: Secondary | ICD-10-CM

## 2018-07-12 DIAGNOSIS — O26893 Other specified pregnancy related conditions, third trimester: Secondary | ICD-10-CM

## 2018-07-12 LAB — WET PREP, GENITAL
CLUE CELLS WET PREP: NONE SEEN
SPERM: NONE SEEN
TRICH WET PREP: NONE SEEN
Yeast Wet Prep HPF POC: NONE SEEN

## 2018-07-12 MED ORDER — CYCLOBENZAPRINE HCL 10 MG PO TABS: 10.0000 mg | ORAL_TABLET | Freq: Two times a day (BID) | ORAL | 0 refills | Status: DC | PRN

## 2018-07-12 MED ORDER — CYCLOBENZAPRINE HCL 10 MG PO TABS
10.0000 mg | ORAL_TABLET | Freq: Once | ORAL | Status: AC
  Administered 2018-07-12: 10 mg via ORAL
  Filled 2018-07-12: qty 1

## 2018-07-12 NOTE — Discharge Instructions (Signed)

## 2018-07-12 NOTE — MAU Provider Note (Signed)
History     CSN: [id number]  Arrival date and time: 07/11/18 2243   First Provider Initiated Contact with Patient 07/11/18 2336      Chief Complaint  Patient presents with  . Abdominal Pain   HPI Catherine Collins is a 22 y.o. G1P0 at 8113w0d who presents to MAU with chief complaint of abdominal pain, new onset tonight at 1800. Denies vaginal bleeding, leaking of fluid, decreased fetal movement, fever, falls, or recent illness.    Patient described recurrent "sore" abdominal pain across her abdomen. Pain is bilateral, does not radiate, no alleviating factors, aggravated by walking. Patient has not attempted medication.   OB History    Gravida  1   Para      Term      Preterm      AB      Living        SAB      TAB      Ectopic      Multiple      Live Births              Past Medical History:  Diagnosis Date  . Medical history non-contributory   . Mental disorder     Past Surgical History:  Procedure Laterality Date  . NO PAST SURGERIES      Family History  Problem Relation Age of Onset  . Drug abuse Maternal Grandfather     Social History   Tobacco Use  . Smoking status: Current Every Day Smoker    Packs/day: 0.50    Types: Cigarettes  . Smokeless tobacco: Never Used  . Tobacco comment: stopped smoking after preg confirm  Substance Use Topics  . Alcohol use: No    Alcohol/week: 0.0 standard drinks  . Drug use: No    Allergies: No Known Allergies  Medications Prior to Admission  Medication Sig Dispense Refill Last Dose  . azithromycin (ZITHROMAX) 250 MG tablet Take 4 tablets all together now. (Patient not taking: Reported on 07/05/2018) 4 tablet 0 Not Taking  . Doxylamine-Pyridoxine (DICLEGIS) 10-10 MG TBEC Take 1 tablet with breakfast and lunch.  Take 2 tablets at bedtime. (Patient not taking: Reported on 04/08/2018) 100 tablet 4 Not Taking  . omeprazole (PRILOSEC) 20 MG capsule Take 1 capsule (20 mg total) by mouth 2 (two) times daily  before a meal. (Patient not taking: Reported on 04/08/2018) 60 capsule 5 Not Taking  . omeprazole (PRILOSEC) 20 MG capsule Take 1 capsule (20 mg total) by mouth 2 (two) times daily before a meal. (Patient not taking: Reported on 07/05/2018) 60 capsule 5 Not Taking  . Prenat-FeCbn-FeAspGl-FA-Omega (OB COMPLETE PETITE) 35-5-1-200 MG CAPS Take 1 tablet by mouth daily. 30 capsule 12 Taking  . QUEtiapine (SEROQUEL) 200 MG tablet Take 200 mg by mouth at bedtime.   Not Taking  . venlafaxine XR (EFFEXOR-XR) 150 MG 24 hr capsule Take 150 mg by mouth daily with breakfast.   Not Taking    Review of Systems  Constitutional: Negative for fever.  Gastrointestinal: Positive for abdominal pain. Negative for abdominal distention, constipation, nausea and vomiting.  Genitourinary: Negative for vaginal bleeding, vaginal discharge and vaginal pain.  Musculoskeletal: Negative for back pain.  Neurological: Negative for headaches.  All other systems reviewed and are negative.  Physical Exam   Blood pressure (!) 110/59, pulse 75, temperature 98.5 F (36.9 C), resp. rate 19, height 5\' 2"  (1.575 m), weight 61.9 kg, last menstrual period 11/24/2017, unknown if currently breastfeeding.  Physical  Exam  Nursing note and vitals reviewed. Constitutional: She is oriented to person, place, and time. She appears well-developed and well-nourished.  Cardiovascular: Normal rate, regular rhythm, normal heart sounds and intact distal pulses.  Respiratory: Effort normal and breath sounds normal.  GI: There is no tenderness. There is no rebound.  Gravid  Genitourinary: Vagina normal and uterus normal. No vaginal discharge found.  Musculoskeletal: Normal range of motion.  Neurological: She is alert and oriented to person, place, and time. She has normal reflexes.   SVE: closed/thick/posterior  MAU Course  Procedures  MDM --Reactive NST: baseline 135, moderate variability, positive accelerations, no decelerations --Toco:  rare contractions, palpate mild, uterine irritability  Patient Vitals for the past 24 hrs:  BP Temp Pulse Resp Height Weight  07/12/18 0041 (!) 110/59 - 75 17 - -  07/11/18 2307 (!) 113/58 98.5 F (36.9 C) 78 19 5\' 2"  (1.575 m) 61.9 kg    Orders Placed This Encounter  Procedures  . Wet prep, genital  . Urinalysis, Routine w reflex microscopic  . Discharge patient   Results for orders placed or performed during the hospital encounter of 07/11/18 (from the past 24 hour(s))  Urinalysis, Routine w reflex microscopic     Status: Abnormal   Collection Time: 07/11/18 11:00 PM  Result Value Ref Range   Color, Urine YELLOW YELLOW   APPearance HAZY (A) CLEAR   Specific Gravity, Urine 1.015 1.005 - 1.030   pH 7.0 5.0 - 8.0   Glucose, UA NEGATIVE NEGATIVE mg/dL   Hgb urine dipstick NEGATIVE NEGATIVE   Bilirubin Urine NEGATIVE NEGATIVE   Ketones, ur NEGATIVE NEGATIVE mg/dL   Protein, ur NEGATIVE NEGATIVE mg/dL   Nitrite NEGATIVE NEGATIVE   Leukocytes, UA NEGATIVE NEGATIVE  Wet prep, genital     Status: Abnormal   Collection Time: 07/11/18 11:55 PM  Result Value Ref Range   Yeast Wet Prep HPF POC NONE SEEN NONE SEEN   Trich, Wet Prep NONE SEEN NONE SEEN   Clue Cells Wet Prep HPF POC NONE SEEN NONE SEEN   WBC, Wet Prep HPF POC FEW (A) NONE SEEN   Sperm NONE SEEN    Meds ordered this encounter  Medications  . cyclobenzaprine (FLEXERIL) tablet 10 mg  . cyclobenzaprine (FLEXERIL) 10 MG tablet    Sig: Take 1 tablet (10 mg total) by mouth 2 (two) times daily as needed for muscle spasms.    Dispense:  20 tablet    Refill:  0    Order Specific Question:   Supervising Provider    Answer:   Reva BoresPRATT, TANYA S [[id number]]    Assessment and Plan  --22 y.o. G1P0 at 8175w0d  --Muscle pain in pregnancy --Reactive NST --Rx flexeril to patient pharmacy as described above --Discharge home in stable condition  F/U: OB [facility] 07/19/18  Calvert CantorSamantha C Shahana Capes, CNM 07/12/2018, 12:58 AM

## 2018-07-13 LAB — GC/CHLAMYDIA PROBE AMP (~~LOC~~) NOT AT ARMC
Chlamydia: NEGATIVE
Neisseria Gonorrhea: NEGATIVE

## 2018-07-19 ENCOUNTER — Encounter: Payer: Self-pay | Admitting: Obstetrics

## 2018-07-19 ENCOUNTER — Ambulatory Visit (INDEPENDENT_AMBULATORY_CARE_PROVIDER_SITE_OTHER): Payer: Medicaid Other | Admitting: Obstetrics

## 2018-07-19 DIAGNOSIS — Z3400 Encounter for supervision of normal first pregnancy, unspecified trimester: Secondary | ICD-10-CM

## 2018-07-19 DIAGNOSIS — Z3403 Encounter for supervision of normal first pregnancy, third trimester: Secondary | ICD-10-CM | POA: Diagnosis not present

## 2018-07-19 DIAGNOSIS — Z23 Encounter for immunization: Secondary | ICD-10-CM | POA: Diagnosis not present

## 2018-07-19 NOTE — Progress Notes (Signed)
Patient reports good fetal movement, denies pain. 

## 2018-07-19 NOTE — Progress Notes (Signed)
Subjective:  Catherine Collins is a 22 y.o. G1P0 at 658w0d being seen today for ongoing prenatal care.  She is currently monitored for the following issues for this low-risk pregnancy and has History of depression; Supervision of normal first pregnancy, antepartum; Late prenatal care; Chlamydia infection affecting pregnancy in second trimester; and Gonorrhea affecting pregnancy in second trimester on their problem list.  Patient reports no complaints.  Contractions: Not present. Vag. Bleeding: None.  Movement: Present. Denies leaking of fluid.   The following portions of the patient's history were reviewed and updated as appropriate: allergies, current medications, past family history, past medical history, past social history, past surgical history and problem list. Problem list updated.  Objective:   Vitals:   07/19/18 0916  BP: 114/72  Pulse: 92  Weight: 137 lb 12.8 oz (62.5 kg)    Fetal Status: Fetal Heart Rate (bpm): 150   Movement: Present     General:  Alert, oriented and cooperative. Patient is in no acute distress.  Skin: Skin is warm and dry. No rash noted.   Cardiovascular: Normal heart rate noted  Respiratory: Normal respiratory effort, no problems with respiration noted  Abdomen: Soft, gravid, appropriate for gestational age. Pain/Pressure: Absent     Pelvic:  Cervical exam deferred        Extremities: Normal range of motion.  Edema: None  Mental Status: Normal mood and affect. Normal behavior. Normal judgment and thought content.   Urinalysis:      Assessment and Plan:  Pregnancy: G1P0 at 578w0d  1. Supervision of normal first pregnancy, antepartum   Preterm labor symptoms and general obstetric precautions including but not limited to vaginal bleeding, contractions, leaking of fluid and fetal movement were reviewed in detail with the patient. Please refer to After Visit Summary for other counseling recommendations.  Return in about 2 weeks (around 08/02/2018) for  ROB.   Brock BadHarper, Charles A, MD

## 2018-08-02 ENCOUNTER — Encounter: Payer: Medicaid Other | Admitting: Obstetrics

## 2018-08-03 ENCOUNTER — Ambulatory Visit (INDEPENDENT_AMBULATORY_CARE_PROVIDER_SITE_OTHER): Payer: Medicaid Other | Admitting: Obstetrics

## 2018-08-03 ENCOUNTER — Encounter: Payer: Self-pay | Admitting: Obstetrics

## 2018-08-03 VITALS — BP 116/75 | HR 96 | Wt 141.0 lb

## 2018-08-03 DIAGNOSIS — Z3403 Encounter for supervision of normal first pregnancy, third trimester: Secondary | ICD-10-CM

## 2018-08-03 DIAGNOSIS — Z8659 Personal history of other mental and behavioral disorders: Secondary | ICD-10-CM

## 2018-08-03 DIAGNOSIS — Z3400 Encounter for supervision of normal first pregnancy, unspecified trimester: Secondary | ICD-10-CM

## 2018-08-03 DIAGNOSIS — F17210 Nicotine dependence, cigarettes, uncomplicated: Secondary | ICD-10-CM

## 2018-08-03 NOTE — Progress Notes (Signed)
Subjective:  Catherine Collins is a 21 y.o. G1P0 at [redacted]w[redacted]d being seen today for ongoing prenatal care.  She is currently monitored for the following issues for this low-risk pregnancy and has History of depression; Supervision of normal first pregnancy, antepartum; Late prenatal care; Chlamydia infection affecting pregnancy in second trimester; and Gonorrhea affecting pregnancy in second trimester on their problem list.  Patient reports no complaints.  Contractions: Not present. Vag. Bleeding: None.  Movement: Present. Denies leaking of fluid.   The following portions of the patient's history were reviewed and updated as appropriate: allergies, current medications, past family history, past medical history, past social history, past surgical history and problem list. Problem list updated.  Objective:   Vitals:   08/03/18 1032  BP: 116/75  Pulse: 96  Weight: 141 lb (64 kg)    Fetal Status:     Movement: Present     General:  Alert, oriented and cooperative. Patient is in no acute distress.  Skin: Skin is warm and dry. No rash noted.   Cardiovascular: Normal heart rate noted  Respiratory: Normal respiratory effort, no problems with respiration noted  Abdomen: Soft, gravid, appropriate for gestational age. Pain/Pressure: Absent     Pelvic:  Cervical exam deferred        Extremities: Normal range of motion.  Edema: None  Mental Status: Normal mood and affect. Normal behavior. Normal judgment and thought content.   Urinalysis:      Assessment and Plan:  Pregnancy: G1P0 at [redacted]w[redacted]d  1. Supervision of normal first pregnancy, antepartum  2. History of depression  3. Tobacco dependence due to cigarettes  Preterm labor symptoms and general obstetric precautions including but not limited to vaginal bleeding, contractions, leaking of fluid and fetal movement were reviewed in detail with the patient. Please refer to After Visit Summary for other counseling recommendations.  Return in about 2  weeks (around 08/17/2018) for ROB.   Brock Bad, MD

## 2018-08-16 ENCOUNTER — Ambulatory Visit (INDEPENDENT_AMBULATORY_CARE_PROVIDER_SITE_OTHER): Payer: Medicaid Other | Admitting: Obstetrics

## 2018-08-16 ENCOUNTER — Encounter: Payer: Self-pay | Admitting: Obstetrics

## 2018-08-16 VITALS — BP 115/69 | HR 96 | Wt 143.5 lb

## 2018-08-16 DIAGNOSIS — Z3403 Encounter for supervision of normal first pregnancy, third trimester: Secondary | ICD-10-CM

## 2018-08-16 DIAGNOSIS — F17210 Nicotine dependence, cigarettes, uncomplicated: Secondary | ICD-10-CM

## 2018-08-16 DIAGNOSIS — Z202 Contact with and (suspected) exposure to infections with a predominantly sexual mode of transmission: Secondary | ICD-10-CM

## 2018-08-16 DIAGNOSIS — Z3400 Encounter for supervision of normal first pregnancy, unspecified trimester: Secondary | ICD-10-CM

## 2018-08-16 DIAGNOSIS — M545 Low back pain: Secondary | ICD-10-CM

## 2018-08-16 DIAGNOSIS — O365990 Maternal care for other known or suspected poor fetal growth, unspecified trimester, not applicable or unspecified: Secondary | ICD-10-CM

## 2018-08-16 DIAGNOSIS — Z8659 Personal history of other mental and behavioral disorders: Secondary | ICD-10-CM

## 2018-08-16 DIAGNOSIS — G8929 Other chronic pain: Secondary | ICD-10-CM

## 2018-08-16 MED ORDER — COMFORT FIT MATERNITY SUPP SM MISC: 0 refills | Status: DC

## 2018-08-16 NOTE — Progress Notes (Addendum)
Subjective:  Catherine Collins is a 22 y.o. G1P0 at 6558w0d being seen today for ongoing prenatal care.  She is currently monitored for the following issues for this low-risk pregnancy and has History of depression; Supervision of normal first pregnancy, antepartum; Late prenatal care; Chlamydia infection affecting pregnancy in second trimester; and Gonorrhea affecting pregnancy in second trimester on their problem list.  Patient reports heartburn and backache.  Contractions: Not present. Vag. Bleeding: None.  Movement: Present. Denies leaking of fluid.   The following portions of the patient's history were reviewed and updated as appropriate: allergies, current medications, past family history, past medical history, past social history, past surgical history and problem list. Problem list updated.  Objective:   Vitals:   08/16/18 1033  BP: 115/69  Pulse: 96  Weight: 143 lb 8 oz (65.1 kg)    Fetal Status:     Movement: Present     General:  Alert, oriented and cooperative. Patient is in no acute distress.  Skin: Skin is warm and dry. No rash noted.   Cardiovascular: Normal heart rate noted  Respiratory: Normal respiratory effort, no problems with respiration noted  Abdomen: Soft, gravid, appropriate for gestational age. Pain/Pressure: Absent     Pelvic:  Cervical exam deferred        Extremities: Normal range of motion.  Edema: Trace  Mental Status: Normal mood and affect. Normal behavior. Normal judgment and thought content.   Urinalysis:      Assessment and Plan:  Pregnancy: G1P0 at 6058w0d  1. Supervision of normal first pregnancy, antepartum  2. History of depression - stable  3. Tobacco dependence due to cigarettes  4. Chlamydia contact, treated  5. Gonorrhea contact, treated  Preterm labor symptoms and general obstetric precautions including but not limited to vaginal bleeding, contractions, leaking of fluid and fetal movement were reviewed in detail with the  patient. Please refer to After Visit Summary for other counseling recommendations.  Return in about 2 weeks (around 08/30/2018) for ROB.   Brock BadHarper, Vian Fluegel A, MD

## 2018-08-29 ENCOUNTER — Encounter: Payer: Medicaid Other | Admitting: Obstetrics

## 2018-08-30 ENCOUNTER — Encounter: Payer: Self-pay | Admitting: Obstetrics

## 2018-08-30 ENCOUNTER — Ambulatory Visit (HOSPITAL_COMMUNITY)
Admission: RE | Admit: 2018-08-30 | Discharge: 2018-08-30 | Disposition: A | Discharge status: Home / Self Care | Payer: Medicaid Other | Source: Ambulatory Visit | Attending: Obstetrics | Admitting: Obstetrics

## 2018-08-30 ENCOUNTER — Other Ambulatory Visit (HOSPITAL_COMMUNITY)
Admission: RE | Admit: 2018-08-30 | Discharge: 2018-08-30 | Disposition: A | Discharge status: Home / Self Care | Payer: Medicaid Other | Source: Ambulatory Visit | Attending: Obstetrics | Admitting: Obstetrics

## 2018-08-30 ENCOUNTER — Ambulatory Visit (INDEPENDENT_AMBULATORY_CARE_PROVIDER_SITE_OTHER): Payer: Medicaid Other | Admitting: Obstetrics

## 2018-08-30 ENCOUNTER — Encounter (HOSPITAL_COMMUNITY): Payer: Self-pay

## 2018-08-30 ENCOUNTER — Other Ambulatory Visit: Payer: Self-pay | Admitting: Obstetrics

## 2018-08-30 VITALS — BP 106/69 | HR 93 | Wt 145.7 lb

## 2018-08-30 VITALS — BP 122/70 | HR 86

## 2018-08-30 DIAGNOSIS — Z3400 Encounter for supervision of normal first pregnancy, unspecified trimester: Secondary | ICD-10-CM | POA: Diagnosis not present

## 2018-08-30 DIAGNOSIS — Z362 Encounter for other antenatal screening follow-up: Secondary | ICD-10-CM | POA: Diagnosis not present

## 2018-08-30 DIAGNOSIS — O365930 Maternal care for other known or suspected poor fetal growth, third trimester, not applicable or unspecified: Secondary | ICD-10-CM | POA: Insufficient documentation

## 2018-08-30 DIAGNOSIS — Z3A00 Weeks of gestation of pregnancy not specified: Secondary | ICD-10-CM | POA: Diagnosis not present

## 2018-08-30 DIAGNOSIS — O365990 Maternal care for other known or suspected poor fetal growth, unspecified trimester, not applicable or unspecified: Secondary | ICD-10-CM

## 2018-08-30 DIAGNOSIS — Z3A36 36 weeks gestation of pregnancy: Secondary | ICD-10-CM | POA: Diagnosis not present

## 2018-08-30 DIAGNOSIS — Z3403 Encounter for supervision of normal first pregnancy, third trimester: Secondary | ICD-10-CM

## 2018-08-30 NOTE — Procedures (Signed)
Catherine Collins 1996/11/03 [redacted]w[redacted]d  Fetus A Non-Stress Test Interpretation for 08/30/18  Indication: Unsatisfactory BPP  Fetal Heart Rate A Mode: External Baseline Rate (A): 130 bpm Variability: Moderate Accelerations: 15 x 15 Decelerations: None Multiple birth?: No  Uterine Activity Mode: Toco Contraction Frequency (min): irreg UC noted Contraction Duration (sec): 60-100 Contraction Quality: Mild Resting Tone Palpated: Relaxed Resting Time: Adequate  Interpretation (Fetal Testing) Nonstress Test Interpretation: Reactive Comments: FHR tracing rev'd by Dr. Grace Bushy

## 2018-08-30 NOTE — Progress Notes (Signed)
Pt is here ROB. G1P0 [redacted]w[redacted]d.

## 2018-08-30 NOTE — Progress Notes (Signed)
Subjective:  Catherine Collins is a 22 y.o. G1P0 at [redacted]w[redacted]d being seen today for ongoing prenatal care.  She is currently monitored for the following issues for this high-risk pregnancy and has History of depression; Supervision of normal first pregnancy, antepartum; Late prenatal care; Chlamydia infection affecting pregnancy in second trimester; and Gonorrhea affecting pregnancy in second trimester on their problem list.  Patient reports no complaints.  Contractions: Irritability. Vag. Bleeding: None.  Movement: Present. Denies leaking of fluid.   The following portions of the patient's history were reviewed and updated as appropriate: allergies, current medications, past family history, past medical history, past social history, past surgical history and problem list. Problem list updated.  Objective:   Vitals:   08/30/18 1031  BP: 106/69  Pulse: 93  Weight: 145 lb 11.2 oz (66.1 kg)    Fetal Status: Fetal Heart Rate (bpm): 150   Movement: Present     General:  Alert, oriented and cooperative. Patient is in no acute distress.  Skin: Skin is warm and dry. No rash noted.   Cardiovascular: Normal heart rate noted  Respiratory: Normal respiratory effort, no problems with respiration noted  Abdomen: Soft, gravid, appropriate for gestational age. Pain/Pressure: Absent     Pelvic:  Cervical exam deferred        Extremities: Normal range of motion.  Edema: Trace  Mental Status: Normal mood and affect. Normal behavior. Normal judgment and thought content.   Urinalysis:      Assessment and Plan:  Pregnancy: G1P0 at [redacted]w[redacted]d  1. Supervision of normal first pregnancy, antepartum Rx: - Strep Gp B NAA - GC/Chlamydia probe amp ([place])not at [facility]  Term labor symptoms and general obstetric precautions including but not limited to vaginal bleeding, contractions, leaking of fluid and fetal movement were reviewed in detail with the patient. Please refer to After Visit Summary for other counseling  recommendations.  Return in about 1 week (around 09/06/2018) for ROB.   Brock Bad, MD

## 2018-08-31 ENCOUNTER — Other Ambulatory Visit: Payer: Self-pay | Admitting: Obstetrics

## 2018-08-31 ENCOUNTER — Inpatient Hospital Stay (HOSPITAL_COMMUNITY)
Admission: AD | Admit: 2018-08-31 | Discharge: 2018-08-31 | Disposition: A | Discharge status: Home / Self Care | Payer: Medicaid Other | Source: Ambulatory Visit | Attending: Obstetrics and Gynecology | Admitting: Obstetrics and Gynecology

## 2018-08-31 ENCOUNTER — Encounter (HOSPITAL_COMMUNITY): Payer: Self-pay | Admitting: *Deleted

## 2018-08-31 ENCOUNTER — Other Ambulatory Visit: Payer: Self-pay

## 2018-08-31 ENCOUNTER — Other Ambulatory Visit (HOSPITAL_COMMUNITY): Payer: Self-pay | Admitting: *Deleted

## 2018-08-31 DIAGNOSIS — R1030 Lower abdominal pain, unspecified: Secondary | ICD-10-CM | POA: Insufficient documentation

## 2018-08-31 DIAGNOSIS — F17210 Nicotine dependence, cigarettes, uncomplicated: Secondary | ICD-10-CM | POA: Insufficient documentation

## 2018-08-31 DIAGNOSIS — O99333 Smoking (tobacco) complicating pregnancy, third trimester: Secondary | ICD-10-CM | POA: Insufficient documentation

## 2018-08-31 DIAGNOSIS — R109 Unspecified abdominal pain: Secondary | ICD-10-CM

## 2018-08-31 DIAGNOSIS — M545 Low back pain: Secondary | ICD-10-CM | POA: Insufficient documentation

## 2018-08-31 DIAGNOSIS — Z3A36 36 weeks gestation of pregnancy: Secondary | ICD-10-CM | POA: Diagnosis not present

## 2018-08-31 DIAGNOSIS — R110 Nausea: Secondary | ICD-10-CM

## 2018-08-31 DIAGNOSIS — O365930 Maternal care for other known or suspected poor fetal growth, third trimester, not applicable or unspecified: Secondary | ICD-10-CM

## 2018-08-31 DIAGNOSIS — O26893 Other specified pregnancy related conditions, third trimester: Secondary | ICD-10-CM | POA: Insufficient documentation

## 2018-08-31 DIAGNOSIS — O479 False labor, unspecified: Secondary | ICD-10-CM

## 2018-08-31 DIAGNOSIS — O4703 False labor before 37 completed weeks of gestation, third trimester: Secondary | ICD-10-CM

## 2018-08-31 LAB — URINALYSIS, ROUTINE W REFLEX MICROSCOPIC
Bilirubin Urine: NEGATIVE
Glucose, UA: NEGATIVE mg/dL
HGB URINE DIPSTICK: NEGATIVE
KETONES UR: 20 mg/dL — AB
LEUKOCYTES UA: NEGATIVE
Nitrite: NEGATIVE
PROTEIN: NEGATIVE mg/dL
Specific Gravity, Urine: 1.016 (ref 1.005–1.030)
pH: 7 (ref 5.0–8.0)

## 2018-08-31 LAB — GC/CHLAMYDIA PROBE AMP (~~LOC~~) NOT AT ARMC
CHLAMYDIA, DNA PROBE: NEGATIVE
NEISSERIA GONORRHEA: NEGATIVE

## 2018-08-31 MED ORDER — ACETAMINOPHEN 500 MG PO TABS: 1000.0000 mg | ORAL_TABLET | Freq: Three times a day (TID) | ORAL | 2 refills | Status: DC | PRN

## 2018-08-31 NOTE — MAU Note (Signed)
Having some bad back pains.  Feels nauseous.  Having some sharp pains in lower abd, nothing regular.

## 2018-08-31 NOTE — MAU Provider Note (Signed)
History    CSN: [id number] Arrival date and time: 08/31/18 1728 First Provider Initiated Contact with Patient 08/31/18 1918     Chief Complaint  Patient presents with  . Contractions  . Back Pain  . Nausea   HPI Catherine Collins is a 22yo G1P0 at [redacted]w[redacted]d who presents to MAU today with several complaints including low back pain, lower abdominal pain and nausea. She states these symptoms all started about 3-4 days ago. She has been moving things in her room. Initially she was complaining of whole body muscle soreness but now just feels the back and lower abdominal pain. She denies fevers/chills, congestion/cough, dysuria/frequency, vomiting or diarrhea. She states the nausea has not prevented her from eating like normal. She feels like she has been drinking plenty of water. Shielding eyes because says they are "burning/watery."  OB History    Gravida  1   Para      Term      Preterm      AB      Living        SAB      TAB      Ectopic      Multiple      Live Births              Past Medical History:  Diagnosis Date  . Medical history non-contributory   . Mental disorder     Past Surgical History:  Procedure Laterality Date  . NO PAST SURGERIES      Family History  Problem Relation Age of Onset  . Drug abuse Maternal Grandfather     Social History   Tobacco Use  . Smoking status: Current Every Day Smoker    Packs/day: 0.50    Types: Cigarettes  . Smokeless tobacco: Never Used  . Tobacco comment: stopped smoking after preg confirm  Substance Use Topics  . Alcohol use: No    Alcohol/week: 0.0 standard drinks  . Drug use: No    Allergies: No Known Allergies  Medications Prior to Admission  Medication Sig Dispense Refill Last Dose  . cyclobenzaprine (FLEXERIL) 10 MG tablet Take 1 tablet (10 mg total) by mouth 2 (two) times daily as needed for muscle spasms. 20 tablet 0 Taking  . Doxylamine-Pyridoxine (DICLEGIS) 10-10 MG TBEC Take 1 tablet with breakfast  and lunch.  Take 2 tablets at bedtime. (Patient not taking: Reported on 08/16/2018) 100 tablet 4 Not Taking  . Elastic Bandages & Supports (COMFORT FIT MATERNITY SUPP SM) MISC Wear as directed. (Patient not taking: Reported on 08/30/2018) 1 each 0 Not Taking  . Prenat-FeCbn-FeAspGl-FA-Omega (OB COMPLETE PETITE) 35-5-1-200 MG CAPS Take 1 tablet by mouth daily. 30 capsule 12 Taking  . QUEtiapine (SEROQUEL) 200 MG tablet Take 200 mg by mouth at bedtime.   Not Taking  . venlafaxine XR (EFFEXOR-XR) 150 MG 24 hr capsule Take 150 mg by mouth daily with breakfast.   Not Taking    Review of Systems  Constitutional: Negative for activity change, appetite change, chills and fever.  HENT: Negative for congestion.   Respiratory: Negative for cough and shortness of breath.   Cardiovascular: Negative for chest pain and palpitations.  Gastrointestinal: Positive for abdominal pain and nausea. Negative for diarrhea and vomiting.  Genitourinary: Negative for dysuria, flank pain, frequency, vaginal bleeding and vaginal discharge.  Musculoskeletal: Positive for back pain. Negative for neck pain.  Neurological: Negative for dizziness, light-headedness and headaches.  Psychiatric/Behavioral: Positive for sleep disturbance.   Physical Exam   Blood  pressure 123/66, pulse (!) 123, temperature 99.6 F (37.6 C), temperature source Oral, resp. rate 18, weight 66.8 kg, last menstrual period 11/24/2017, SpO2 100 %, unknown if currently breastfeeding.  Physical Exam  Nursing note and vitals reviewed. Constitutional: She is oriented to person, place, and time. She appears well-developed and well-nourished. No distress.  Lying down, appears clammy, shielding eyes from light   HENT:  Head: Normocephalic and atraumatic.  Eyes: Conjunctivae and EOM are normal. No scleral icterus.  Neck: Neck supple. No thyromegaly present.  Cardiovascular: Regular rhythm, normal heart sounds and intact distal pulses.  No murmur  heard. Sinus tachycardia  Respiratory: Effort normal and breath sounds normal. She has no wheezes.  GI: Soft. Bowel sounds are normal. There is no tenderness. There is no guarding.  gravid  Musculoskeletal: She exhibits no edema.  Lymphadenopathy:    She has no cervical adenopathy.  Neurological: She is alert and oriented to person, place, and time.  Skin: Skin is warm and dry. No rash noted.  Psychiatric: She has a normal mood and affect. Her behavior is normal.   MAU Course  Procedures  MDM -- Reactive NST: 150s/mod/+a/-d; uterine irritability   Assessment and Plan  22yo G1P0 at [redacted]w[redacted]d who presents with back pain, lower abdominal pain, and nausea. SVE FT, no signs of labor. Reactive NST. Patient was tachycardic and with elevated temperature but no focal signs of infection and patient denying other symptoms. Urine with ketones but otherwise not concerning for infection. Encouraged po hydration and Tylenol. Back pain likely related to ligamentous laxity with pregnancy, no CVA tenderness. Lower abdominal sharp pains radiating slightly to sides likely related to round ligament pain. Reviewed return precautions with patient. All questions answered and discharged home.   Tamera Stands, DO 08/31/2018, 7:18 PM

## 2018-08-31 NOTE — Discharge Instructions (Signed)
Braxton Hicks Contractions °Contractions of the uterus can occur throughout pregnancy, but they are not always a sign that you are in labor. You may have practice contractions called Braxton Hicks contractions. These false labor contractions are sometimes confused with true labor. °What are Braxton Hicks contractions? °Braxton Hicks contractions are tightening movements that occur in the muscles of the uterus before labor. Unlike true labor contractions, these contractions do not result in opening (dilation) and thinning of the cervix. Toward the end of pregnancy (32-34 weeks), Braxton Hicks contractions can happen more often and may become stronger. These contractions are sometimes difficult to tell apart from true labor because they can be very uncomfortable. You should not feel embarrassed if you go to the hospital with false labor. °Sometimes, the only way to tell if you are in true labor is for your health care provider to look for changes in the cervix. The health care provider will do a physical exam and may monitor your contractions. If you are not in true labor, the exam should show that your cervix is not dilating and your water has not broken. °If there are other health problems associated with your pregnancy, it is completely safe for you to be sent home with false labor. You may continue to have Braxton Hicks contractions until you go into true labor. °How to tell the difference between true labor and false labor °True labor °· Contractions last 30-70 seconds. °· Contractions become very regular. °· Discomfort is usually felt in the top of the uterus, and it spreads to the lower abdomen and low back. °· Contractions do not go away with walking. °· Contractions usually become more intense and increase in frequency. °· The cervix dilates and gets thinner. °False labor °· Contractions are usually shorter and not as strong as true labor contractions. °· Contractions are usually irregular. °· Contractions  are often felt in the front of the lower abdomen and in the groin. °· Contractions may go away when you walk around or change positions while lying down. °· Contractions get weaker and are shorter-lasting as time goes on. °· The cervix usually does not dilate or become thin. °Follow these instructions at home: °· Take over-the-counter and prescription medicines only as told by your health care provider. °· Keep up with your usual exercises and follow other instructions from your health care provider. °· Eat and drink lightly if you think you are going into labor. °· If Braxton Hicks contractions are making you uncomfortable: °? Change your position from lying down or resting to walking, or change from walking to resting. °? Sit and rest in a tub of warm water. °? Drink enough fluid to keep your urine pale yellow. Dehydration may cause these contractions. °? Do slow and deep breathing several times an hour. °· Keep all follow-up prenatal visits as told by your health care provider. This is important. °Contact a health care provider if: °· You have a fever. °· You have continuous pain in your abdomen. °Get help right away if: °· Your contractions become stronger, more regular, and closer together. °· You have fluid leaking or gushing from your vagina. °· You pass blood-tinged mucus (bloody show). °· You have bleeding from your vagina. °· You have low back pain that you never had before. °· You feel your baby’s head pushing down and causing pelvic pressure. °· Your baby is not moving inside you as much as it used to. °Summary °· Contractions that occur before labor are called Braxton   Hicks contractions, false labor, or practice contractions. °· Braxton Hicks contractions are usually shorter, weaker, farther apart, and less regular than true labor contractions. True labor contractions usually become progressively stronger and regular and they become more frequent. °· Manage discomfort from Braxton Hicks contractions by  changing position, resting in a warm bath, drinking plenty of water, or practicing deep breathing. °This information is not intended to replace advice given to you by your health care provider. Make sure you discuss any questions you have with your health care provider. °Document Released: 04/01/2017 Document Revised: 04/01/2017 Document Reviewed: 04/01/2017 °Elsevier Interactive Patient Education © 2018 [employer] ° °Fetal Movement Counts °Patient Name: ________________________________________________ Patient Due Date: ____________________ °What is a fetal movement count? °A fetal movement count is the number of times that you feel your baby move during a certain amount of time. This may also be called a fetal kick count. A fetal movement count is recommended for every pregnant woman. You may be asked to start counting fetal movements as early as week 28 of your pregnancy. °Pay attention to when your baby is most active. You may notice your baby's sleep and wake cycles. You may also notice things that make your baby move more. You should do a fetal movement count: °· When your baby is normally most active. °· At the same time each day. ° °A good time to count movements is while you are resting, after having something to eat and drink. °How do I count fetal movements? °1. Find a quiet, comfortable area. Sit, or lie down on your side. °2. Write down the date, the start time and stop time, and the number of movements that you felt between those two times. Take this information with you to your health care visits. °3. For 2 hours, count kicks, flutters, swishes, rolls, and jabs. You should feel at least 10 movements during 2 hours. °4. You may stop counting after you have felt 10 movements. °5. If you do not feel 10 movements in 2 hours, have something to eat and drink. Then, keep resting and counting for 1 hour. If you feel at least 4 movements during that hour, you may stop counting. °Contact a health care  provider if: °· You feel fewer than 4 movements in 2 hours. °· Your baby is not moving like he or she usually does. °Date: ____________ Start time: ____________ Stop time: ____________ Movements: ____________ °Date: ____________ Start time: ____________ Stop time: ____________ Movements: ____________ °Date: ____________ Start time: ____________ Stop time: ____________ Movements: ____________ °Date: ____________ Start time: ____________ Stop time: ____________ Movements: ____________ °Date: ____________ Start time: ____________ Stop time: ____________ Movements: ____________ °Date: ____________ Start time: ____________ Stop time: ____________ Movements: ____________ °Date: ____________ Start time: ____________ Stop time: ____________ Movements: ____________ °Date: ____________ Start time: ____________ Stop time: ____________ Movements: ____________ °Date: ____________ Start time: ____________ Stop time: ____________ Movements: ____________ °This information is not intended to replace advice given to you by your health care provider. Make sure you discuss any questions you have with your health care provider. °Document Released: 12/16/2006 Document Revised: 07/15/2016 Document Reviewed: 12/26/2015 °Elsevier Interactive Patient Education © 2018 [employer] ° °

## 2018-09-01 ENCOUNTER — Other Ambulatory Visit: Payer: Self-pay | Admitting: Obstetrics

## 2018-09-01 LAB — STREP GP B NAA: Strep Gp B NAA: POSITIVE — AB

## 2018-09-06 ENCOUNTER — Ambulatory Visit (INDEPENDENT_AMBULATORY_CARE_PROVIDER_SITE_OTHER): Payer: Medicaid Other | Admitting: Obstetrics

## 2018-09-06 ENCOUNTER — Encounter (HOSPITAL_COMMUNITY): Payer: Self-pay

## 2018-09-06 ENCOUNTER — Ambulatory Visit (HOSPITAL_COMMUNITY)
Admission: RE | Admit: 2018-09-06 | Discharge: 2018-09-06 | Disposition: A | Discharge status: Home / Self Care | Payer: Medicaid Other | Source: Ambulatory Visit | Attending: Obstetrics | Admitting: Obstetrics

## 2018-09-06 ENCOUNTER — Other Ambulatory Visit (HOSPITAL_COMMUNITY)
Admission: RE | Admit: 2018-09-06 | Discharge: 2018-09-06 | Disposition: A | Discharge status: Home / Self Care | Payer: Medicaid Other | Source: Ambulatory Visit | Attending: Obstetrics | Admitting: Obstetrics

## 2018-09-06 ENCOUNTER — Encounter: Payer: Self-pay | Admitting: Obstetrics

## 2018-09-06 ENCOUNTER — Other Ambulatory Visit (HOSPITAL_COMMUNITY): Payer: Self-pay | Admitting: Maternal & Fetal Medicine

## 2018-09-06 VITALS — BP 115/73 | HR 99 | Wt 147.4 lb

## 2018-09-06 DIAGNOSIS — Z202 Contact with and (suspected) exposure to infections with a predominantly sexual mode of transmission: Secondary | ICD-10-CM

## 2018-09-06 DIAGNOSIS — O0930 Supervision of pregnancy with insufficient antenatal care, unspecified trimester: Secondary | ICD-10-CM

## 2018-09-06 DIAGNOSIS — Z3403 Encounter for supervision of normal first pregnancy, third trimester: Secondary | ICD-10-CM

## 2018-09-06 DIAGNOSIS — O365930 Maternal care for other known or suspected poor fetal growth, third trimester, not applicable or unspecified: Secondary | ICD-10-CM

## 2018-09-06 DIAGNOSIS — Z3400 Encounter for supervision of normal first pregnancy, unspecified trimester: Secondary | ICD-10-CM

## 2018-09-06 DIAGNOSIS — Z3A37 37 weeks gestation of pregnancy: Secondary | ICD-10-CM | POA: Insufficient documentation

## 2018-09-06 DIAGNOSIS — J029 Acute pharyngitis, unspecified: Secondary | ICD-10-CM

## 2018-09-06 LAB — OB RESULTS CONSOLE GC/CHLAMYDIA: GC PROBE AMP, GENITAL: NEGATIVE

## 2018-09-06 LAB — POCT RAPID STREP A (OFFICE): Rapid Strep A Screen: NEGATIVE

## 2018-09-06 NOTE — Progress Notes (Signed)
Subjective:  Catherine Collins is a 22 y.o. G1P0 at [redacted]w[redacted]d being seen today for ongoing prenatal care.  She is currently monitored for the following issues for this low-risk pregnancy and has History of depression; Supervision of normal first pregnancy, antepartum; Late prenatal care; Chlamydia infection affecting pregnancy in second trimester; and Gonorrhea affecting pregnancy in second trimester on their problem list.  Patient reports no complaints.  Contractions: Not present. Vag. Bleeding: None.  Movement: Present. Denies leaking of fluid.   The following portions of the patient's history were reviewed and updated as appropriate: allergies, current medications, past family history, past medical history, past social history, past surgical history and problem list. Problem list updated.  Objective:   Vitals:   09/06/18 1436  BP: 115/73  Pulse: 99  Weight: 147 lb 6.4 oz (66.9 kg)    Fetal Status:     Movement: Present     General:  Alert, oriented and cooperative. Patient is in no acute distress.  Skin: Skin is warm and dry. No rash noted.   Cardiovascular: Normal heart rate noted  Respiratory: Normal respiratory effort, no problems with respiration noted  Abdomen: Soft, gravid, appropriate for gestational age. Pain/Pressure: Present     Pelvic:  Cervical exam deferred        Extremities: Normal range of motion.  Edema: Trace  Mental Status: Normal mood and affect. Normal behavior. Normal judgment and thought content.   Urinalysis:      Assessment and Plan:  Pregnancy: G1P0 at [redacted]w[redacted]d  1. Supervision of normal first pregnancy, antepartum   Term labor symptoms and general obstetric precautions including but not limited to vaginal bleeding, contractions, leaking of fluid and fetal movement were reviewed in detail with the patient. Please refer to After Visit Summary for other counseling recommendations.  Return in about 1 week (around 09/13/2018) for ROB.   Brock Bad, MD

## 2018-09-06 NOTE — Progress Notes (Signed)
Patient reports good fetal movement with occasional pressure. Pt states that she engaged in oral sex over a week ago and has had a sore throat and yellow mucous. Pt desires vaginal and throat cultures for possible stds today.

## 2018-09-06 NOTE — Procedures (Signed)
Catherine Collins 1996/07/20 [redacted]w[redacted]d  Fetus A Non-Stress Test Interpretation for 09/06/18  Indication: IUGR  Fetal Heart Rate A Mode: External Baseline Rate (A): 145 bpm Variability: Moderate Accelerations: 15 x 15 Decelerations: None Multiple birth?: No  Uterine Activity Mode: Toco Contraction Frequency (min): irreg Contraction Duration (sec): 20-60 Contraction Quality: Mild Resting Tone Palpated: Relaxed Resting Time: Adequate  Interpretation (Fetal Testing) Nonstress Test Interpretation: Reactive Overall Impression: Reassuring for gestational age Comments: EFM tracing reviewed by Dr. Perry Mount

## 2018-09-07 ENCOUNTER — Encounter: Payer: Medicaid Other | Admitting: Obstetrics

## 2018-09-07 LAB — CERVICOVAGINAL ANCILLARY ONLY
Bacterial vaginitis: NEGATIVE
CANDIDA VAGINITIS: NEGATIVE
CHLAMYDIA, DNA PROBE: NEGATIVE
Chlamydia: NEGATIVE
Neisseria Gonorrhea: NEGATIVE
Neisseria Gonorrhea: NEGATIVE
Trichomonas: NEGATIVE

## 2018-09-13 ENCOUNTER — Other Ambulatory Visit (HOSPITAL_COMMUNITY): Payer: Self-pay | Admitting: Maternal & Fetal Medicine

## 2018-09-13 ENCOUNTER — Encounter (HOSPITAL_COMMUNITY): Payer: Self-pay

## 2018-09-13 ENCOUNTER — Ambulatory Visit (HOSPITAL_BASED_OUTPATIENT_CLINIC_OR_DEPARTMENT_OTHER)
Admission: RE | Admit: 2018-09-13 | Discharge: 2018-09-13 | Disposition: A | Discharge status: Home / Self Care | Payer: Medicaid Other | Source: Ambulatory Visit | Attending: Obstetrics | Admitting: Obstetrics

## 2018-09-13 ENCOUNTER — Other Ambulatory Visit: Payer: Self-pay

## 2018-09-13 ENCOUNTER — Encounter: Payer: Self-pay | Admitting: Obstetrics

## 2018-09-13 ENCOUNTER — Inpatient Hospital Stay (HOSPITAL_COMMUNITY)
Admission: AD | Admit: 2018-09-13 | Discharge: 2018-09-16 | DRG: 807 | Disposition: A | Discharge status: Home / Self Care | Payer: Medicaid Other | Attending: Family Medicine | Admitting: Family Medicine

## 2018-09-13 ENCOUNTER — Ambulatory Visit (HOSPITAL_COMMUNITY)
Admission: RE | Admit: 2018-09-13 | Discharge: 2018-09-13 | Disposition: A | Discharge status: Home / Self Care | Payer: Medicaid Other | Source: Ambulatory Visit | Attending: Obstetrics | Admitting: Obstetrics

## 2018-09-13 ENCOUNTER — Encounter (HOSPITAL_COMMUNITY): Payer: Self-pay | Admitting: *Deleted

## 2018-09-13 ENCOUNTER — Telehealth (HOSPITAL_COMMUNITY): Payer: Self-pay | Admitting: *Deleted

## 2018-09-13 ENCOUNTER — Ambulatory Visit (INDEPENDENT_AMBULATORY_CARE_PROVIDER_SITE_OTHER): Payer: Medicaid Other | Admitting: Obstetrics

## 2018-09-13 VITALS — BP 126/85 | HR 98 | Wt 147.4 lb

## 2018-09-13 DIAGNOSIS — Z3A38 38 weeks gestation of pregnancy: Secondary | ICD-10-CM | POA: Insufficient documentation

## 2018-09-13 DIAGNOSIS — Z8659 Personal history of other mental and behavioral disorders: Secondary | ICD-10-CM

## 2018-09-13 DIAGNOSIS — Z8759 Personal history of other complications of pregnancy, childbirth and the puerperium: Secondary | ICD-10-CM | POA: Diagnosis present

## 2018-09-13 DIAGNOSIS — O98812 Other maternal infectious and parasitic diseases complicating pregnancy, second trimester: Secondary | ICD-10-CM | POA: Diagnosis present

## 2018-09-13 DIAGNOSIS — F17210 Nicotine dependence, cigarettes, uncomplicated: Secondary | ICD-10-CM

## 2018-09-13 DIAGNOSIS — O99824 Streptococcus B carrier state complicating childbirth: Secondary | ICD-10-CM | POA: Diagnosis present

## 2018-09-13 DIAGNOSIS — O0930 Supervision of pregnancy with insufficient antenatal care, unspecified trimester: Secondary | ICD-10-CM

## 2018-09-13 DIAGNOSIS — Z3400 Encounter for supervision of normal first pregnancy, unspecified trimester: Secondary | ICD-10-CM

## 2018-09-13 DIAGNOSIS — O368130 Decreased fetal movements, third trimester, not applicable or unspecified: Secondary | ICD-10-CM | POA: Diagnosis present

## 2018-09-13 DIAGNOSIS — O365930 Maternal care for other known or suspected poor fetal growth, third trimester, not applicable or unspecified: Secondary | ICD-10-CM

## 2018-09-13 DIAGNOSIS — Z23 Encounter for immunization: Secondary | ICD-10-CM | POA: Diagnosis not present

## 2018-09-13 DIAGNOSIS — Z87891 Personal history of nicotine dependence: Secondary | ICD-10-CM

## 2018-09-13 DIAGNOSIS — A749 Chlamydial infection, unspecified: Secondary | ICD-10-CM | POA: Diagnosis present

## 2018-09-13 DIAGNOSIS — O98212 Gonorrhea complicating pregnancy, second trimester: Secondary | ICD-10-CM | POA: Diagnosis present

## 2018-09-13 DIAGNOSIS — O365990 Maternal care for other known or suspected poor fetal growth, unspecified trimester, not applicable or unspecified: Secondary | ICD-10-CM

## 2018-09-13 DIAGNOSIS — Z3689 Encounter for other specified antenatal screening: Secondary | ICD-10-CM

## 2018-09-13 DIAGNOSIS — Z3403 Encounter for supervision of normal first pregnancy, third trimester: Secondary | ICD-10-CM

## 2018-09-13 LAB — ABO/RH: ABO/RH(D): B POS

## 2018-09-13 LAB — CBC
HEMATOCRIT: 35.3 % — AB (ref 36.0–46.0)
HEMOGLOBIN: 11.7 g/dL — AB (ref 12.0–15.0)
MCH: 31.5 pg (ref 26.0–34.0)
MCHC: 33.1 g/dL (ref 30.0–36.0)
MCV: 95.1 fL (ref 80.0–100.0)
Platelets: 309 10*3/uL (ref 150–400)
RBC: 3.71 MIL/uL — AB (ref 3.87–5.11)
RDW: 13.2 % (ref 11.5–15.5)
WBC: 13.1 10*3/uL — AB (ref 4.0–10.5)
nRBC: 0 % (ref 0.0–0.2)

## 2018-09-13 LAB — TYPE AND SCREEN
ABO/RH(D): B POS
ANTIBODY SCREEN: NEGATIVE

## 2018-09-13 MED ORDER — ACETAMINOPHEN 325 MG PO TABS: 650.0000 mg | ORAL_TABLET | ORAL | Status: DC | PRN

## 2018-09-13 MED ORDER — ONDANSETRON HCL 4 MG/2ML IJ SOLN
4.0000 mg | Freq: Four times a day (QID) | INTRAMUSCULAR | Status: DC | PRN
  Administered 2018-09-14 (×2): 4 mg via INTRAVENOUS
  Filled 2018-09-13 (×2): qty 2

## 2018-09-13 MED ORDER — LIDOCAINE HCL (PF) 1 % IJ SOLN
30.0000 mL | INTRAMUSCULAR | Status: DC | PRN
  Filled 2018-09-13: qty 30

## 2018-09-13 MED ORDER — OXYTOCIN BOLUS FROM INFUSION
500.0000 mL | Freq: Once | INTRAVENOUS | Status: AC
  Administered 2018-09-14: 500 mL via INTRAVENOUS

## 2018-09-13 MED ORDER — PENICILLIN G 3 MILLION UNITS IVPB - SIMPLE MED
3.0000 10*6.[IU] | INTRAVENOUS | Status: DC
  Administered 2018-09-13 – 2018-09-14 (×5): 3 10*6.[IU] via INTRAVENOUS
  Filled 2018-09-13 (×8): qty 100

## 2018-09-13 MED ORDER — MISOPROSTOL 50MCG HALF TABLET
50.0000 ug | ORAL_TABLET | Freq: Once | ORAL | Status: AC
  Administered 2018-09-13: 50 ug via ORAL
  Filled 2018-09-13: qty 1

## 2018-09-13 MED ORDER — OXYCODONE-ACETAMINOPHEN 5-325 MG PO TABS: 1.0000 | ORAL_TABLET | ORAL | Status: DC | PRN

## 2018-09-13 MED ORDER — SOD CITRATE-CITRIC ACID 500-334 MG/5ML PO SOLN: 30.0000 mL | ORAL | Status: DC | PRN

## 2018-09-13 MED ORDER — FENTANYL CITRATE (PF) 100 MCG/2ML IJ SOLN
100.0000 ug | INTRAMUSCULAR | Status: DC | PRN
  Administered 2018-09-14: 100 ug via INTRAVENOUS
  Filled 2018-09-13: qty 2

## 2018-09-13 MED ORDER — OXYCODONE-ACETAMINOPHEN 5-325 MG PO TABS: 2.0000 | ORAL_TABLET | ORAL | Status: DC | PRN

## 2018-09-13 MED ORDER — QUETIAPINE FUMARATE 200 MG PO TABS
200.0000 mg | ORAL_TABLET | Freq: Every day | ORAL | Status: DC
  Filled 2018-09-13: qty 1

## 2018-09-13 MED ORDER — SODIUM CHLORIDE 0.9 % IV SOLN
5.0000 10*6.[IU] | Freq: Once | INTRAVENOUS | Status: AC
  Administered 2018-09-13: 5 10*6.[IU] via INTRAVENOUS
  Filled 2018-09-13: qty 5

## 2018-09-13 MED ORDER — LACTATED RINGERS IV SOLN: 500.0000 mL | INTRAVENOUS | Status: DC | PRN

## 2018-09-13 MED ORDER — VENLAFAXINE HCL ER 150 MG PO CP24
150.0000 mg | ORAL_CAPSULE | Freq: Every day | ORAL | Status: DC
  Filled 2018-09-13: qty 1

## 2018-09-13 MED ORDER — MISOPROSTOL 50MCG HALF TABLET
50.0000 ug | ORAL_TABLET | ORAL | Status: DC | PRN
  Administered 2018-09-13: 50 ug via BUCCAL
  Filled 2018-09-13 (×3): qty 1

## 2018-09-13 MED ORDER — LACTATED RINGERS IV SOLN
INTRAVENOUS | Status: DC
  Administered 2018-09-13 – 2018-09-14 (×4): via INTRAVENOUS

## 2018-09-13 MED ORDER — OXYTOCIN 40 UNITS IN LACTATED RINGERS INFUSION - SIMPLE MED: 2.5000 [IU]/h | INTRAVENOUS | Status: DC

## 2018-09-13 NOTE — MAU Note (Signed)
Urine in lab 

## 2018-09-13 NOTE — Progress Notes (Signed)
[facility]: Labor Progress Note  Subjective: Strip note. Discussed plan of care with RN. Patient still comfortable, FB still in place.   Objective: BP (!) 108/51   Pulse 75   Temp 98.8 F (37.1 C) (Oral)   Resp 17   Ht 5\' 2"  (1.575 m)   Wt 67.5 kg   LMP 11/24/2017   BMI 27.22 kg/m  Gen: strip note Dilation: 1 Effacement (%): Thick Station: -3 Presentation: Vertex Exam by:: Dr. Marlis Edelson   Assessment and Plan: 22 y.o. G1P0 [redacted]w[redacted]d here for IOL for BPP 6/10, IUGR.   Labor: Induction started with cytotec afternoon 08/1518. Will plan for additional dose of cytotec if FB still in place. -- s/p cytotec x 2  -- FB placed  -- pain control: may defer epidural, going to see how things go  -- PPH Risk: low  Fetal Well-Being: EFW 2178g (<10% at 36w0). Cephalic by sutures.  -- Category I - continuous fetal monitoring  -- GBS(+) - pcn    Fitzpatrick Alberico S. Earlene Plater, DO OB/GYN Fellow, Faculty [facility]  11:17 PM

## 2018-09-13 NOTE — MAU Note (Signed)
Pt presents to MAU from MFM for non reactive NST with a BPP 4/8. Reports lower abdominal cramping.

## 2018-09-13 NOTE — Progress Notes (Signed)
Subjective:  Catherine Collins is a 22 y.o. G1P0 at [redacted]w[redacted]d being seen today for ongoing prenatal care.  She is currently monitored for the following issues for this high-risk pregnancy and has History of depression; Supervision of normal first pregnancy, antepartum; Late prenatal care; Chlamydia infection affecting pregnancy in second trimester; and Gonorrhea affecting pregnancy in second trimester on their problem list.  Patient reports no complaints.  Contractions: Not present. Vag. Bleeding: None.  Movement: Present. Denies leaking of fluid.   The following portions of the patient's history were reviewed and updated as appropriate: allergies, current medications, past family history, past medical history, past social history, past surgical history and problem list. Problem list updated.  Objective:   Vitals:   09/13/18 1025  BP: 126/85  Pulse: 98  Weight: 147 lb 6.4 oz (66.9 kg)    Fetal Status: Fetal Heart Rate (bpm): 150   Movement: Present     General:  Alert, oriented and cooperative. Patient is in no acute distress.  Skin: Skin is warm and dry. No rash noted.   Cardiovascular: Normal heart rate noted  Respiratory: Normal respiratory effort, no problems with respiration noted  Abdomen: Soft, gravid, appropriate for gestational age. Pain/Pressure: Present     Pelvic:  Cervical exam deferred        Extremities: Normal range of motion.  Edema: Trace  Mental Status: Normal mood and affect. Normal behavior. Normal judgment and thought content.   Urinalysis:      Assessment and Plan:  Pregnancy: G1P0 at [redacted]w[redacted]d  1. Supervision of normal first pregnancy, antepartum  2. Intrauterine growth restriction (IUGR) affecting care of mother, third trimester, single or unspecified fetus - weekly NST / BPP - delivery by 39 weeks, per MFM recommendation  3. History of depression - stable  4. Tobacco dependence due to cigarettes   Term labor symptoms and general obstetric precautions  including but not limited to vaginal bleeding, contractions, leaking of fluid and fetal movement were reviewed in detail with the patient. Please refer to After Visit Summary for other counseling recommendations.  Return in about 1 week (around 09/20/2018) for ROB.   Brock Bad, MD

## 2018-09-13 NOTE — ED Notes (Signed)
Dr. Judeth Cornfield reported BPP 4/8 to Dr. Adrian Blackwater. RN report called to Ginger Morris, RN, CN in MAU. Patient escorted to MAU for further evaluation.

## 2018-09-13 NOTE — Progress Notes (Signed)
Pt is here for ROB. G1P0 [redacted]w[redacted]d.

## 2018-09-13 NOTE — Progress Notes (Signed)
OB/GYN Faculty Practice: Labor Progress Note  Subjective: Doing well. Nervous about FB. Not feeling much discomfort. Grandmother, FOB, father's mother in room.   Objective: BP 113/68   Pulse 84   Temp 98.8 F (37.1 C) (Oral)   Resp 20   Ht 5\' 2"  (1.575 m)   Wt 67.5 kg   LMP 11/24/2017   BMI 27.22 kg/m  Gen: well-appearing, NAD Dilation: 1 Effacement (%): Thick Station: -3 Presentation: Vertex Exam by:: Dr. Marlis Edelson   Assessment and Plan: 22 y.o. G1P0 [redacted]w[redacted]d here for IOL for BPP 6/10, IUGR.   Labor: Induction started with cytotec afternoon 08/1518.  -- s/p cytotec x 2 -- FB placed  -- pain control: planning for epidural -- PPH Risk: low  Fetal Well-Being: EFW 2178g (<10% at 36w0). Cephalic by sutures.  -- Category I - continuous fetal monitoring  -- GBS(+) - pcn    Catherine Berke S. Earlene Plater, DO OB/GYN Fellow, Faculty Practice  8:29 PM

## 2018-09-13 NOTE — H&P (Addendum)
LABOR AND DELIVERY ADMISSION HISTORY AND PHYSICAL NOTE  Catherine Collins is a 22 y.o. female G1P0 with IUP at [redacted]w[redacted]d by U/S presenting for IOL due to IUGR and non-reassuring BPP of 4/8 for gross body movements and tone. However, had a reactive NST while in the MAU afterwards. MFM recommending induction of labor.   She reports positive fetal movement, however less than expected. She denies leakage of fluid or vaginal bleeding. Denies any SOB, blurry vision, HA, or abdominal pain.   Prenatal History/Complications: PNC at [facility]  Pregnancy complications:  -IUGR  -History of depression, not currently on medication, previously on Effexor and Seroquel prior to pregnancy. No endorsement of depression right now.  -Chlamydia and gonorrhea infection in 2nd trimester, with TOC negative  -Previous smoker, quit once pregnancy confirmed   Past Medical History: Past Medical History:  Diagnosis Date  . Anxiety   . Mental disorder     Past Surgical History: Past Surgical History:  Procedure Laterality Date  . WISDOM TOOTH EXTRACTION      Obstetrical History: OB History    Gravida  1   Para      Term      Preterm      AB      Living        SAB      TAB      Ectopic      Multiple      Live Births              Social History: Social History   Socioeconomic History  . Marital status: Single    Spouse name: Not on file  . Number of children: Not on file  . Years of education: Not on file  . Highest education level: Not on file  Occupational History  . Not on file  Social Needs  . Financial resource strain: Not hard at all  . Food insecurity:    Worry: Never true    Inability: Never true  . Transportation needs:    Medical: No    Non-medical: Not on file  Tobacco Use  . Smoking status: Former Smoker    Packs/day: 0.50    Types: Cigarettes    Last attempt to quit: 02/11/2018    Years since quitting: 0.5  . Smokeless tobacco: Never Used  . Tobacco comment:  stopped smoking after preg confirm  Substance and Sexual Activity  . Alcohol use: No    Alcohol/week: 0.0 standard drinks  . Drug use: No  . Sexual activity: Yes    Birth control/protection: None  Lifestyle  . Physical activity:    Days per week: Not on file    Minutes per session: Not on file  . Stress: To some extent  Relationships  . Social connections:    Talks on phone: Not on file    Gets together: Not on file    Attends religious service: Not on file    Active member of club or organization: Not on file    Attends meetings of clubs or organizations: Not on file    Relationship status: Not on file  Other Topics Concern  . Not on file  Social History Narrative  . Not on file    Family History: Family History  Problem Relation Age of Onset  . Drug abuse Maternal Grandfather     Allergies: No Known Allergies  Medications Prior to Admission  Medication Sig Dispense Refill Last Dose  . acetaminophen (TYLENOL) 500 MG tablet  Take 2 tablets (1,000 mg total) by mouth every 8 (eight) hours as needed (pain, fever). 100 tablet 2 Past Week at Unknown time  . Prenat-FeCbn-FeAspGl-FA-Omega (OB COMPLETE PETITE) 35-5-1-200 MG CAPS Take 1 tablet by mouth daily. 30 capsule 12 09/12/2018 at Unknown time  . cyclobenzaprine (FLEXERIL) 10 MG tablet Take 1 tablet (10 mg total) by mouth 2 (two) times daily as needed for muscle spasms. (Patient not taking: Reported on 09/06/2018) 20 tablet 0 Not Taking     Review of Systems  All systems reviewed and negative except as stated in HPI  Physical Exam Blood pressure 118/73, pulse 92, resp. rate 16, last menstrual period 11/24/2017, unknown if currently breastfeeding. General appearance: alert, oriented, NAD Lungs: normal respiratory effort Heart: regular rate Abdomen: soft, non-tender; gravid, FH appropriate for GA Extremities: No calf swelling or tenderness Presentation: cephalic by sutures  Fetal monitoring: reactive - baseline 135, mod  var, + accels, - decels  Uterine activity: Irritable, irregular  Dilation: 1.5 Effacement (%): 50 Station: -2 Presentation: Vertex Exam by:: Foley    Prenatal labs: ABO, Rh: B/Positive/-- (04/30 1113) Antibody: Negative (04/30 1113) Rubella: 1.19 (04/30 1113) RPR: Non Reactive (08/06 1030)  HBsAg: Negative (04/30 1113)  HIV: Non Reactive (08/06 1030)  GC/Chlamydia: Negative  GBS: Positive (10/01 1404)  2-hr GTT: not done  Genetic screening:  AFP neg, Mat 21 neg  Anatomy US: Normal female   Prenatal Transfer Tool  Maternal Diabetes: No Genetic Screening: Normal Maternal Ultrasounds/Referrals: Normal Fetal Ultrasounds or other Referrals:  Referred to Materal Fetal Medicine IUGR Maternal Substance Abuse:  No Previous smoker prior to pregnancy.  Significant Maternal Medications:  None Significant Maternal Lab Results: Lab values include: Group B Strep positive  No results found for this or any previous visit (from the past 24 hour(s)).  Patient Active Problem List   Diagnosis Date Noted  . Chlamydia infection affecting pregnancy in second trimester 05/24/2018  . Gonorrhea affecting pregnancy in second trimester 05/24/2018  . History of depression 03/29/2018  . Supervision of normal first pregnancy, antepartum 03/29/2018  . Late prenatal care 03/29/2018    Assessment: Catherine Collins is a 22 y.o. G1P0 at [redacted]w[redacted]d here for IOL due to IUGR (10/2: EFW 2178 g) and non-reasssuring fetal BPP of 4/8 this morning. Had a reactive NST today afterwards. Pregnancy complicated by IUGR, history of depression (not currently medicated) and chlamydia/gonorrhea infection in 2nd trimester, with TOC negative. GBS positive.    #Labor: IOL as above, start with cytotec buccal 50. Consideration for FB if needed, however patient declining this currently.  #Pain: Undecided, would like to try naturally.  #FWB: Cat 1 strip, will monitor closely.  #ID: GBS positive, PCN  #MOF: Breastfeeding  #MOC:  Undecided, aware of options.  #Circ: N/A  1. History of depression: Stable. Not endorsing depressive symptoms currently. Previously on Effexor and Seroquel, has not taken during pregnancy (stopped around Feb). Already established with therapist and will start seeing her after pregnancy. Undecided if she wants to take medication following delivery.   -Monitor for PP depression  -SW consult   Allayne Stack  Family Medicine PGY-1  09/13/2018, 3:20 PM   Attestation: I have seen this patient and agree with the resident's documentation. I have examined them separately, and we have discussed the plan of care.  Cristal Deer. Earlene Plater, DO OB/GYN Fellow

## 2018-09-13 NOTE — MAU Provider Note (Signed)
History     CSN: [id number]  Arrival date and time: 09/13/18 1414   None     Chief Complaint  Patient presents with  . Decreased Fetal Movement  . non reactive NST   HPI 22 year old G1, P0 at 38 weeks who is being followed by MFM for IUGR with last growth ultrasound on 08/31/2018 with an [facility] of less than 3rd percentile.  Estimated fetal weight at that time was 2178 g.  She had a BPP today that was 4 out of 8 with a reported decreased fetal movement.  Cord Dopplers were normal.  Patient was sent here for NST.  Patient otherwise feels fine without complaints  OB History    Gravida  1   Para      Term      Preterm      AB      Living        SAB      TAB      Ectopic      Multiple      Live Births              Past Medical History:  Diagnosis Date  . Anxiety   . Mental disorder     Past Surgical History:  Procedure Laterality Date  . WISDOM TOOTH EXTRACTION      Family History  Problem Relation Age of Onset  . Drug abuse Maternal Grandfather     Social History   Tobacco Use  . Smoking status: Former Smoker    Packs/day: 0.50    Types: Cigarettes    Last attempt to quit: 02/11/2018    Years since quitting: 0.5  . Smokeless tobacco: Never Used  . Tobacco comment: stopped smoking after preg confirm  Substance Use Topics  . Alcohol use: No    Alcohol/week: 0.0 standard drinks  . Drug use: No    Allergies: No Known Allergies  Medications Prior to Admission  Medication Sig Dispense Refill Last Dose  . acetaminophen (TYLENOL) 500 MG tablet Take 2 tablets (1,000 mg total) by mouth every 8 (eight) hours as needed (pain, fever). (Patient not taking: Reported on 09/06/2018) 100 tablet 2 Not Taking  . cyclobenzaprine (FLEXERIL) 10 MG tablet Take 1 tablet (10 mg total) by mouth 2 (two) times daily as needed for muscle spasms. (Patient not taking: Reported on 09/06/2018) 20 tablet 0 Not Taking  . Elastic Bandages & Supports (COMFORT FIT MATERNITY SUPP  SM) MISC Wear as directed. (Patient not taking: Reported on 08/30/2018) 1 each 0 Not Taking  . Prenat-FeCbn-FeAspGl-FA-Omega (OB COMPLETE PETITE) 35-5-1-200 MG CAPS Take 1 tablet by mouth daily. 30 capsule 12 Taking  . QUEtiapine (SEROQUEL) 200 MG tablet Take 200 mg by mouth at bedtime.   Not Taking  . venlafaxine XR (EFFEXOR-XR) 150 MG 24 hr capsule Take 150 mg by mouth daily with breakfast.   Not Taking    Review of Systems Physical Exam   Blood pressure 118/73, pulse 92, resp. rate 16, last menstrual period 11/24/2017, unknown if currently breastfeeding.  Physical Exam  Constitutional: She appears well-developed and well-nourished.  HENT:  Head: Normocephalic and atraumatic.  Right Ear: External ear normal.  Left Ear: External ear normal.  Neck: Normal range of motion. Neck supple.  Cardiovascular: Normal rate, regular rhythm and normal heart sounds.  Respiratory: Effort normal and breath sounds normal.  GI: Soft.  Skin: Skin is warm and dry.  Psychiatric: She has a normal mood and affect. Her behavior is  normal. Judgment and thought content normal.    MAU Course  Procedures  MDM NST reactive  Assessment and Plan  1. IUGR 2. Nonreassuring fetal status 3. Reactive NST  Recommendation from MFM is to move towards delivery With a reactive NST, patient should be able to tolerate labor.  Admit to L&D  Levie Heritage 09/13/2018, 2:49 PM

## 2018-09-13 NOTE — Telephone Encounter (Signed)
Preadmission screen  

## 2018-09-13 NOTE — Anesthesia Pain Management Evaluation Note (Signed)
  CRNA Pain Management Visit Note  Patient: Catherine Collins, 22 y.o., female  "Hello I am a member of the anesthesia team at [facility]. We have an anesthesia team available at all times to provide care throughout the hospital, including epidural management and anesthesia for C-section. I don't know your plan for the delivery whether it a natural birth, water birth, IV sedation, nitrous supplementation, doula or epidural, but we want to meet your pain goals."   1.Was your pain managed to your expectations on prior hospitalizations?   No prior hospitalizations  2.What is your expectation for pain management during this hospitalization?     IV pain meds  3.How can we help you reach that goal? unsure  Record the patient's initial score and the patient's pain goal.   Pain: 4  Pain Goal: 8 [facility] wants you to be able to say your pain was always managed very well.  Cephus Shelling 09/13/2018

## 2018-09-14 ENCOUNTER — Inpatient Hospital Stay (HOSPITAL_COMMUNITY): Payer: Medicaid Other | Admitting: Anesthesiology

## 2018-09-14 ENCOUNTER — Encounter (HOSPITAL_COMMUNITY): Payer: Self-pay

## 2018-09-14 DIAGNOSIS — Z3A38 38 weeks gestation of pregnancy: Secondary | ICD-10-CM

## 2018-09-14 DIAGNOSIS — O365930 Maternal care for other known or suspected poor fetal growth, third trimester, not applicable or unspecified: Secondary | ICD-10-CM

## 2018-09-14 LAB — RPR: RPR Ser Ql: NONREACTIVE

## 2018-09-14 MED ORDER — DIPHENHYDRAMINE HCL 50 MG/ML IJ SOLN: 12.5000 mg | INTRAMUSCULAR | Status: DC | PRN

## 2018-09-14 MED ORDER — TETANUS-DIPHTH-ACELL PERTUSSIS 5-2.5-18.5 LF-MCG/0.5 IM SUSP: 0.5000 mL | Freq: Once | INTRAMUSCULAR | Status: DC

## 2018-09-14 MED ORDER — EPHEDRINE 5 MG/ML INJ
10.0000 mg | INTRAVENOUS | Status: DC | PRN
  Filled 2018-09-14: qty 2

## 2018-09-14 MED ORDER — IBUPROFEN 600 MG PO TABS
600.0000 mg | ORAL_TABLET | Freq: Four times a day (QID) | ORAL | Status: DC
  Administered 2018-09-14 – 2018-09-16 (×9): 600 mg via ORAL
  Filled 2018-09-14 (×9): qty 1

## 2018-09-14 MED ORDER — SENNOSIDES-DOCUSATE SODIUM 8.6-50 MG PO TABS
2.0000 | ORAL_TABLET | ORAL | Status: DC
  Administered 2018-09-14 – 2018-09-16 (×2): 2 via ORAL
  Filled 2018-09-14 (×2): qty 2

## 2018-09-14 MED ORDER — ONDANSETRON HCL 4 MG PO TABS: 4.0000 mg | ORAL_TABLET | ORAL | Status: DC | PRN

## 2018-09-14 MED ORDER — WITCH HAZEL-GLYCERIN EX PADS: 1.0000 "application " | MEDICATED_PAD | CUTANEOUS | Status: DC | PRN

## 2018-09-14 MED ORDER — MEASLES, MUMPS & RUBELLA VAC ~~LOC~~ INJ
0.5000 mL | INJECTION | Freq: Once | SUBCUTANEOUS | Status: DC
  Filled 2018-09-14: qty 0.5

## 2018-09-14 MED ORDER — DIPHENHYDRAMINE HCL 25 MG PO CAPS: 25.0000 mg | ORAL_CAPSULE | Freq: Four times a day (QID) | ORAL | Status: DC | PRN

## 2018-09-14 MED ORDER — ACETAMINOPHEN 325 MG PO TABS: 650.0000 mg | ORAL_TABLET | ORAL | Status: DC | PRN

## 2018-09-14 MED ORDER — OXYTOCIN 40 UNITS IN LACTATED RINGERS INFUSION - SIMPLE MED
1.0000 m[IU]/min | INTRAVENOUS | Status: DC
  Administered 2018-09-14: 2 m[IU]/min via INTRAVENOUS
  Filled 2018-09-14: qty 1000

## 2018-09-14 MED ORDER — SIMETHICONE 80 MG PO CHEW: 80.0000 mg | CHEWABLE_TABLET | ORAL | Status: DC | PRN

## 2018-09-14 MED ORDER — FENTANYL 2.5 MCG/ML BUPIVACAINE 1/10 % EPIDURAL INFUSION (WH - ANES)
14.0000 mL/h | INTRAMUSCULAR | Status: DC | PRN
  Administered 2018-09-14: 13 mL/h via EPIDURAL
  Filled 2018-09-14: qty 100

## 2018-09-14 MED ORDER — COCONUT OIL OIL: 1.0000 "application " | TOPICAL_OIL | Status: DC | PRN

## 2018-09-14 MED ORDER — DIBUCAINE 1 % RE OINT: 1.0000 "application " | TOPICAL_OINTMENT | RECTAL | Status: DC | PRN

## 2018-09-14 MED ORDER — ONDANSETRON HCL 4 MG/2ML IJ SOLN: 4.0000 mg | INTRAMUSCULAR | Status: DC | PRN

## 2018-09-14 MED ORDER — BENZOCAINE-MENTHOL 20-0.5 % EX AERO: 1.0000 "application " | INHALATION_SPRAY | CUTANEOUS | Status: DC | PRN

## 2018-09-14 MED ORDER — PHENYLEPHRINE 40 MCG/ML (10ML) SYRINGE FOR IV PUSH (FOR BLOOD PRESSURE SUPPORT)
80.0000 ug | PREFILLED_SYRINGE | INTRAVENOUS | Status: DC | PRN
  Filled 2018-09-14: qty 5

## 2018-09-14 MED ORDER — PRENATAL MULTIVITAMIN CH
1.0000 | ORAL_TABLET | Freq: Every day | ORAL | Status: DC
  Administered 2018-09-15 – 2018-09-16 (×2): 1 via ORAL
  Filled 2018-09-14 (×2): qty 1

## 2018-09-14 MED ORDER — ZOLPIDEM TARTRATE 5 MG PO TABS: 5.0000 mg | ORAL_TABLET | Freq: Every evening | ORAL | Status: DC | PRN

## 2018-09-14 MED ORDER — LIDOCAINE HCL (PF) 1 % IJ SOLN
INTRAMUSCULAR | Status: DC | PRN
  Administered 2018-09-14 (×2): 4 mL via EPIDURAL

## 2018-09-14 MED ORDER — LACTATED RINGERS IV SOLN: 500.0000 mL | Freq: Once | INTRAVENOUS | Status: AC

## 2018-09-14 MED ORDER — PHENYLEPHRINE 40 MCG/ML (10ML) SYRINGE FOR IV PUSH (FOR BLOOD PRESSURE SUPPORT)
80.0000 ug | PREFILLED_SYRINGE | INTRAVENOUS | Status: DC | PRN
  Filled 2018-09-14: qty 10
  Filled 2018-09-14: qty 5

## 2018-09-14 MED ORDER — LACTATED RINGERS IV SOLN
500.0000 mL | Freq: Once | INTRAVENOUS | Status: AC
  Administered 2018-09-14: 500 mL via INTRAVENOUS

## 2018-09-14 NOTE — Progress Notes (Signed)
OB/GYN Faculty Practice: Labor Progress Note  Subjective: Doing well, was able to get some rest.   Objective: BP (!) 108/51   Pulse 75   Temp 98.8 F (37.1 C) (Oral)   Resp 17   Ht 5\' 2"  (1.575 m)   Wt 67.5 kg   LMP 11/24/2017   BMI 27.22 kg/m  Gen: comfortable appearing  Dilation: 4.5 Effacement (%): 60 Station: -3 Presentation: Vertex Exam by:: Dr. Earlene Plater   Assessment and Plan: 22 y.o. G1P0 [redacted]w[redacted]d here for IOL for BPP 6/10, IUGR.   Labor: Induction started with cytotec afternoon 08/1518. FB now out, cervix soft and stretchy will transition to pitocin.  -- s/p cytotec x 2  -- titrate pitocin  -- pain control: may defer epidural, going to see how things go  -- PPH Risk: low  Fetal Well-Being: EFW 2178g (<10% at 36w0). Cephalic by sutures.  -- Category I - continuous fetal monitoring  -- GBS(+) - pcn    Tieler Cournoyer S. Earlene Plater, DO OB/GYN Fellow, Faculty Practice  1:58 AM

## 2018-09-14 NOTE — Progress Notes (Signed)
LABOR PROGRESS NOTE  Catherine Collins is a 22 y.o. G1P0 at [redacted]w[redacted]d  admitted for IOL for IUGR and non-reassuring BPP 4/8.   Subjective: Patient comfortable with epidural.   Objective: BP 109/68   Pulse 81   Temp 98.8 F (37.1 C) (Oral)   Resp 18   Ht 5\' 2"  (1.575 m)   Wt 67.5 kg   LMP 11/24/2017   SpO2 100%   BMI 27.22 kg/m  or  Vitals:   09/14/18 0730 09/14/18 0800 09/14/18 0830 09/14/18 0910  BP: (!) 106/56 115/73 109/68   Pulse: 80 93 81   Resp:      Temp:    98.8 F (37.1 C)  TempSrc:    Oral  SpO2:      Weight:      Height:        Dilation: 9 Effacement (%): 100 Cervical Position: Middle Station: Plus 1 Presentation: Vertex Exam by:: MD Aggie Hacker: baseline rate 140, moderate varibility, +acel, early decel Toco: q1-3 min   Labs: Lab Results  Component Value Date   WBC 13.1 (H) 09/13/2018   HGB 11.7 (L) 09/13/2018   HCT 35.3 (L) 09/13/2018   MCV 95.1 09/13/2018   PLT 309 09/13/2018    Patient Active Problem List   Diagnosis Date Noted  . IUGR (intrauterine growth restriction) affecting care of mother 09/13/2018  . History of tobacco use 09/13/2018  . Chlamydia infection affecting pregnancy in second trimester 05/24/2018  . Gonorrhea affecting pregnancy in second trimester 05/24/2018  . History of depression 03/29/2018  . Supervision of normal first pregnancy, antepartum 03/29/2018  . Late prenatal care 03/29/2018    Assessment / Plan: 22 y.o. G1P0 at [redacted]w[redacted]d here for IOL for IUGR and BPP 6/10./   Labor: AROM at 1005 with clear fluid and bloody show. Pitocin at 8 mu/min with good cervical progression.  Fetal Wellbeing:  Cat I  Pain Control:  Epidural in place  Anticipated MOD:  NSVD   Marcy Siren, D.O. OB Fellow  09/14/2018, 10:10 AM

## 2018-09-14 NOTE — Progress Notes (Signed)
[name] was referred for history of depression/anxiety. * Referral screened out by Clinical Social Worker because none of the following criteria appear to apply: ~ History of anxiety/depression during this pregnancy, or of post-partum depression following prior delivery. ~ Diagnosis of anxiety and/or depression within last 3 years OR * [name]'s symptoms currently being treated with medication and/or therapy. Please contact the Clinical Social Worker if needs arise, by [name] request, or if [name] scores greater than 9/yes to question 10 on Edinburgh Postpartum Depression Screen.  Juston Goheen, LCSW Clinical Social Worker  [facility] [employer]  [phone]  

## 2018-09-14 NOTE — Anesthesia Preprocedure Evaluation (Signed)
Anesthesia Evaluation  Patient identified by MRN, date of birth, ID band Patient awake    Reviewed: Allergy & Precautions, Patient's Chart, lab work & pertinent test results  Airway Mallampati: II  TM Distance: >3 FB Neck ROM: Full    Dental no notable dental hx. (+) Teeth Intact   Pulmonary former smoker,    Pulmonary exam normal breath sounds clear to auscultation       Cardiovascular negative cardio ROS Normal cardiovascular exam Rhythm:Regular Rate:Normal     Neuro/Psych negative neurological ROS  negative psych ROS   GI/Hepatic negative GI ROS, Neg liver ROS,   Endo/Other  negative endocrine ROS  Renal/GU negative Renal ROS  negative genitourinary   Musculoskeletal negative musculoskeletal ROS (+)   Abdominal   Peds negative pediatric ROS (+)  Hematology negative hematology ROS (+)   Anesthesia Other Findings   Reproductive/Obstetrics negative OB ROS (+) Pregnancy                             Anesthesia Physical Anesthesia Plan  ASA: II  Anesthesia Plan:    Post-op Pain Management:    Induction:   PONV Risk Score and Plan: 2 and Treatment may vary due to age or medical condition  Airway Management Planned: Natural Airway  Additional Equipment:   Intra-op Plan:   Post-operative Plan:   Informed Consent: I have reviewed the patients History and Physical, chart, labs and discussed the procedure including the risks, benefits and alternatives for the proposed anesthesia with the patient or authorized representative who has indicated his/her understanding and acceptance.     Plan Discussed with: CRNA  Anesthesia Plan Comments:         Anesthesia Quick Evaluation

## 2018-09-14 NOTE — Progress Notes (Signed)
OB/GYN Faculty Practice: Labor Progress Note  Subjective: Strip note. Discussed plan of care with RN. Patient now has epidural in place, very comfortable. Progressed to 6-7 dilation after epidural placement with BBOW.   Objective: BP 119/71   Pulse 73   Temp 98.9 F (37.2 C) (Oral)   Resp 18   Ht 5\' 2"  (1.575 m)   Wt 67.5 kg   LMP 11/24/2017   SpO2 99%   BMI 27.22 kg/m  Gen: strip note  Dilation: 6.5 Effacement (%): 90 Station: -2 Presentation: Vertex Exam by:: Beitz RN   Assessment and Plan: 22 y.o. G1P0 [redacted]w[redacted]d here for IOL for BPP 6/10, IUGR.   Labor: Induction started with cytotec afternoon 08/1518.  -- s/p cytotec x 2, FB -- titrate pitocin as able -- consider AROM with next check  -- pain control: epidural in place  -- PPH Risk: low  Fetal Well-Being: EFW 2178g (<10% at 36w0). Cephalic by sutures.  -- Category I - continuous fetal monitoring  -- GBS(+) - pcn    Nitara Szczerba S. Earlene Plater, DO OB/GYN Fellow, Faculty Practice  6:01 AM

## 2018-09-14 NOTE — Plan of Care (Signed)
Initated in error.

## 2018-09-14 NOTE — Lactation Note (Signed)
This note was copied from a baby's chart. Lactation Consultation Note  Patient Name: Catherine Collins [id number] Date: 09/14/2018 Reason for consult: Initial assessment;Primapara;Early term 37-38.6wks  Breastfeeding consultation services and support information given.  Newborn is 5 hours old and has had attempts at breast but sleepy.  Discussed first 24 hour behavior.  Instructed to watch for feeding cues and call for assist prn   Maternal Data Has patient been taught Hand Expression?: Yes Does the patient have breastfeeding experience prior to this delivery?: No  Feeding Feeding Type: Breast Fed  LATCH Score                   Interventions    Lactation Tools Discussed/Used     Consult Status Consult Status: Follow-up Date: 09/15/18 Follow-up type: In-patient    Huston Foley 09/14/2018, 6:26 PM

## 2018-09-14 NOTE — Anesthesia Procedure Notes (Signed)
Epidural Patient location during procedure: OB Start time: 09/14/2018 4:13 AM End time: 09/14/2018 4:15 AM  Staffing Anesthesiologist: Kaylyn Layer, MD Performed: anesthesiologist   Preanesthetic Checklist Completed: patient identified, pre-op evaluation, timeout performed, IV checked, risks and benefits discussed and monitors and equipment checked  Epidural Patient position: sitting Prep: site prepped and draped and DuraPrep Patient monitoring: continuous pulse ox, blood pressure, heart rate and cardiac monitor Approach: midline Location: L3-L4 Injection technique: LOR air  Needle:  Needle type: Tuohy  Needle gauge: 17 G Needle length: 9 cm Needle insertion depth: 5 cm Catheter type: closed end flexible Catheter size: 19 Gauge Catheter at skin depth: 10 cm Test dose: negative and Other (1% lidocaine)  Assessment Events: blood not aspirated, injection not painful, no injection resistance, negative IV test and no paresthesia  Additional Notes Patient identified. Risks, benefits, and alternatives discussed with patient including but not limited to bleeding, infection, nerve damage, paralysis, failed block, incomplete pain control, headache, blood pressure changes, nausea, vomiting, reactions to medication, itching, and postpartum back pain. Confirmed with bedside nurse the patient's most recent platelet count. Confirmed with patient that they are not currently taking any anticoagulation, have any bleeding history, or any family history of bleeding disorders. Patient expressed understanding and wished to proceed. All questions were answered. Sterile technique was used throughout the entire procedure. Please see nursing notes for vital signs. Crisp LOR on first pass. Test dose was given through epidural catheter and negative prior to continuing to dose epidural or start infusion. Warning signs of high block given to the patient including shortness of breath, tingling/numbness in  hands, complete motor block, or any concerning symptoms with instructions to call for help. Patient was given instructions on fall risk and not to get out of bed. All questions and concerns addressed with instructions to call with any issues or inadequate analgesia.  Reason for block:procedure for pain

## 2018-09-14 NOTE — Anesthesia Postprocedure Evaluation (Signed)
Anesthesia Post Note  Patient: Catherine Collins  Procedure(s) Performed: AN AD HOC LABOR EPIDURAL     Patient location during evaluation: Mother Baby Anesthesia Type: Epidural Level of consciousness: awake and alert Pain management: pain level controlled Vital Signs Assessment: post-procedure vital signs reviewed and stable Respiratory status: spontaneous breathing, nonlabored ventilation and respiratory function stable Cardiovascular status: stable Postop Assessment: no headache, no backache and epidural receding Anesthetic complications: no    Last Vitals:  Vitals:   09/14/18 1446 09/14/18 1529  BP: 117/68 131/86  Pulse: 80 91  Resp:  17  Temp:  37.1 C  SpO2:  100%    Last Pain:  Vitals:   09/14/18 1739  TempSrc:   PainSc: 3    Pain Goal:                 [name]

## 2018-09-15 MED ORDER — INFLUENZA VAC SPLIT QUAD 0.5 ML IM SUSY
0.5000 mL | PREFILLED_SYRINGE | Freq: Once | INTRAMUSCULAR | Status: AC
  Administered 2018-09-15: 0.5 mL via INTRAMUSCULAR
  Filled 2018-09-15: qty 0.5

## 2018-09-15 NOTE — Anesthesia Postprocedure Evaluation (Signed)
Anesthesia Post Note  Patient: Catherine Collins  Procedure(s) Performed: AN AD HOC LABOR EPIDURAL     Patient location during evaluation: Mother Baby Anesthesia Type: Epidural Level of consciousness: awake and alert and oriented Pain management: pain level controlled Vital Signs Assessment: post-procedure vital signs reviewed and stable Respiratory status: spontaneous breathing Cardiovascular status: stable Postop Assessment: no headache, patient able to bend at knees, no apparent nausea or vomiting, no backache, epidural receding, adequate PO intake and able to ambulate Anesthetic complications: no    Last Vitals:  Vitals:   09/14/18 2322 09/15/18 0655  BP: 112/71 103/65  Pulse: 80 83  Resp: 18 18  Temp: 36.7 C 36.6 C  SpO2:      Last Pain:  Vitals:   09/15/18 0655  TempSrc: Oral  PainSc: 0-No pain   Pain Goal:                 [name]

## 2018-09-15 NOTE — Lactation Note (Signed)
This note was copied from a baby's chart. Lactation Consultation Note  Patient Name: Catherine Collins ZOXWR'U Date: 09/15/2018 Reason for consult: Initial assessment;1st time breastfeeding;Early term 37-38.6wks P1, 11 hour female infant BF concerns: Infant not sustain latch, infant w/ small mouth and mom with large breast but small nipples, mom with nerve damage right shoulder and arm. LC entered room, mom was trying to latch infant to breast, LC assisted mom in latching infant to left breast using football hold, explained infant mouth has to have a wide gape. Mom breast is compressible and LC ask mom to hold breast in " C hold",  hand position. Infant latched  -4 minutes and stopped.Mom was pleased stating that is first time infant latched to breast for extended period and that infant did not  latch in L&D.  LC asked mom hand express mom has good colostrum present and hand expressed 8 ml that was spoon feed to infant. Mom expressed additional 3 ml for future feeding. Mom will continue to work with latching infant to breast and will ask for assistance from nurse and LC.   Maternal Data    Feeding Feeding Type: Breast Fed  LATCH Score Latch: Repeated attempts needed to sustain latch, nipple held in mouth throughout feeding, stimulation needed to elicit sucking reflex.  Audible Swallowing: Spontaneous and intermittent  Type of Nipple: Everted at rest and after stimulation  Comfort (Breast/Nipple): Soft / non-tender  Hold (Positioning): Assistance needed to correctly position infant at breast and maintain latch.  LATCH Score: 8  Interventions Interventions: Assisted with latch  Lactation Tools Discussed/Used     Consult Status Consult Status: Follow-up Date: 09/15/18 Follow-up type: In-patient    Danelle Earthly 09/15/2018, 12:48 AM

## 2018-09-15 NOTE — Lactation Note (Signed)
This note was copied from a baby's chart. Lactation Consultation Note  Patient Name: Catherine Collins Date: 09/15/2018 Reason for consult: Follow-up assessment;Early term 37-38.6wks;1st time breastfeeding;Difficult latch;Infant weight loss  34 hours old early term baby who is being exclusively BF by her mother, she's a P45. RN requested LC help due to difficult latch, mom getting frustrated with a crying baby when entering the room; baby at 3% weight loss. Mom voiced she didn't get any help to syringe feed baby, LC assisted with syringe feeding prior latching, baby took 4 ml of EBM.  LC took baby STS to mom's left breast and she was able to latch on after a few tries but had to keep repositioning baby because she kept breaking the latch. No audible swallows noted, however mom was very pleased that her baby ate and that she was able to finally sustain a latch for 6 minutes. Asked mom to call for assistance when needed.  Mom won't be using her DEBP because she doesn't feel comfortable with it, she'll be using her hand pump to syringe feed her baby EBM. LC and RN Bre (lactation shadow) explained to mom the importance of consistent pumping, bilateral pumping and benefits of STS. Cluster feeding was also discussed  Feeding plan:  1. Mom will continue feeding baby STS 8-12 times/24 hours or sooner if feeding cues are present 2. Mom will continue pumping every 3 hours and at least once at night, a total of 8 pumping sessions/24 hours 3. She'll continue syring feeding her baby any amount of EBM she may get, mom now feels comfortable with syringe feeding on her own 4. LC reassured mom that feeding plan will be reassessed tomorrow by the 48 hour mark to adjust it to baby's age in case further supplementation is needed.  Mom and GOB reported all questions and concerns were answered, they're both aware of LC services and will call PRN.  Maternal Data    Feeding Feeding Type: Breast  Fed  LATCH Score Latch: Repeated attempts needed to sustain latch, nipple held in mouth throughout feeding, stimulation needed to elicit sucking reflex.  Audible Swallowing: None  Type of Nipple: Everted at rest and after stimulation  Comfort (Breast/Nipple): Soft / non-tender  Hold (Positioning): Assistance needed to correctly position infant at breast and maintain latch.  LATCH Score: 6  Interventions Interventions: Breast feeding basics reviewed;Assisted with latch;Skin to skin;Breast massage;Hand express;Breast compression;Adjust position;Support pillows  Lactation Tools Discussed/Used     Consult Status Consult Status: Follow-up Date: 09/16/18 Follow-up type: In-patient    Catherine Collins 09/15/2018, 10:59 PM

## 2018-09-15 NOTE — Lactation Note (Signed)
This note was copied from a baby's chart. Lactation Consultation Note  Patient Name: Catherine Collins ZOXWR'U Date: 09/15/2018 Reason for consult: Follow-up assessment;Early term 37-38.6wks;1st time breastfeeding;Primapara;Difficult latch;Infant weight loss  As LC entered the room mom holding baby and baby wrapped in the baby blanket.  Last feeding was from a curved tip syringe with the MBURN assist - of 5 ml.  Several feeding to today were 5 mins ,attempts and the last long feeding was at  0800 for 15 mins.  LC offered to check diaper . 2 wets back to back during change and mec stool.  LC placed baby STS and offered to assist to latch./ left breast / football / multiple  Swallows noted and baby able to sustain latch for 13 mins./ per mom comfortable.  Football position works well for mom due to limited ROM in her right arm from a birth issue.  3 pillows used for support.  After baby fed and grandmother holding baby and satisfied / LC set up the DEBP and instructed mom on the initiation phase. The pumping went well with mom allowing her breast to hang naturally and pumping with the longer 80 ml bottles/ easier to hold on to them both.  Mom will pumping as LC finished her consult.  Cleaning of her pump pieces explained . Soap and proper set supplied on the counter.  LC recommended feed the baby 1st and then post pump both breast for 15 -20 mins/ save milk for next feeding.  LC recommended and encouraged mom as needed to work on the latch.  Mom thanked the [facility] for her assistance and expressed she felt it was the best feeding at the breast so far. LC assisted and worked on obtaining depth.  Per mom active with WIC [place].      Maternal Data Has patient been taught Hand Expression?: Yes  Feeding Feeding Type: Breast Fed  LATCH Score Latch: Grasps breast easily, tongue down, lips flanged, rhythmical sucking.  Audible Swallowing: Spontaneous and intermittent  Type of Nipple: Everted at  rest and after stimulation  Comfort (Breast/Nipple): Soft / non-tender  Hold (Positioning): Assistance needed to correctly position infant at breast and maintain latch.  LATCH Score: 9  Interventions Interventions: Breast feeding basics reviewed;Assisted with latch;Skin to skin;Breast massage;Hand express;Breast compression;Adjust position;Support pillows;Position options;Hand pump;DEBP  Lactation Tools Discussed/Used Tools: Pump;Flanges Flange Size: 24;Other (comment)(good fit ) Breast pump type: Double-Electric Breast Pump;Manual WIC Program: Yes Pump Review: Setup, frequency, and cleaning;Milk Storage Initiated by:: MAI / mom already had the hand pump  Date initiated:: 09/15/18   Consult Status Consult Status: Follow-up Date: 09/16/18 Follow-up type: In-patient    Matilde Sprang Shalayne Leach 09/15/2018, 5:39 PM

## 2018-09-15 NOTE — Progress Notes (Signed)
[name] was referred for history of depression/anxiety. * Referral screened out by Clinical Social Worker because none of the following criteria appear to apply: ~ History of anxiety/depression during this pregnancy, or of post-partum depression following prior delivery. ~ Diagnosis of anxiety and/or depression within last 3 years OR * [name]'s symptoms currently being treated with medication and/or therapy. Please contact the Clinical Social Worker if needs arise, by [name] request, or if [name] scores greater than 9/yes to question 10 on [place] Postpartum Depression Screen.  * [name] has previously been prescribed effexor- but is currently not taking it.   Please re consult CSW if [name] scores greater than 9/yes to question 10 on [place] Postpartum Depression Screen.  Amarionna Arca, LCSW Clinical Social Worker  [facility] [employer]  [phone]  

## 2018-09-15 NOTE — Progress Notes (Signed)
POSTPARTUM PROGRESS NOTE  Post Partum Day 1  Subjective:  Catherine Collins is a 22 y.o. G1P1001 s/p SVD at [redacted]w[redacted]d.  She reports she is doing well. No acute events overnight. She denies any problems with ambulating, voiding or po intake. Endorses flatus, no BM yet. Denies nausea or vomiting.  Pain is well controlled.  Lochia is mild.  Objective: Blood pressure 103/65, pulse 83, temperature 97.8 F (36.6 C), temperature source Oral, resp. rate 18, height 5\' 2"  (1.575 m), weight 67.5 kg, last menstrual period 11/24/2017, SpO2 100 %, unknown if currently breastfeeding.  Physical Exam:  General: alert, cooperative and no distress Chest: no respiratory distress Heart:regular rate, distal pulses intact Abdomen: soft, nontender,  Uterine Fundus: firm, appropriately tender DVT Evaluation: No calf swelling or tenderness Extremities: No edema Skin: warm, dry  Recent Labs    09/13/18 1527  HGB 11.7*  HCT 35.3*    Assessment/Plan: Catherine Collins is a 22 y.o. G1P1001 s/p SVD at [redacted]w[redacted]d.    PPD#1 - Doing well  Routine postpartum care  Contraception: Abstinence currently  Feeding: Breastfeeding Dispo: Plan for discharge on PPD#2.   LOS: 2 days   Leticia Penna, D.O. Family Medicine PGY-1  09/15/2018, 7:55 AM

## 2018-09-15 NOTE — Plan of Care (Signed)
  Problem: Role Relationship: Goal: Ability to demonstrate positive interaction with newborn will improve Note:  Mother has needed assistance latching baby. Discussed and demonstrated correct latch position and signs of appropriate latch. Mother has been hand expressing colostrum and using a hand pump and spoon feeding as well since baby has been sleepy and latching for only a few minutes at a time. Discussed supply and demand and reassured mother that baby is getting an appropriate amount of breast milk according to the voids and stools that baby has had in less than 24 hours. Earl Gala, Linda Hedges [place]

## 2018-09-16 MED ORDER — ACETAMINOPHEN 325 MG PO TABS: 650.0000 mg | ORAL_TABLET | ORAL | 3 refills | Status: DC | PRN

## 2018-09-16 MED ORDER — IBUPROFEN 800 MG PO TABS: 800.0000 mg | ORAL_TABLET | Freq: Three times a day (TID) | ORAL | 0 refills | Status: DC | PRN

## 2018-09-16 MED ORDER — WITCH HAZEL-GLYCERIN EX PADS: 1.0000 "application " | MEDICATED_PAD | CUTANEOUS | 12 refills | Status: DC | PRN

## 2018-09-16 NOTE — Lactation Note (Signed)
This note was copied from a baby's chart. Lactation Consultation Note  Patient Name: Girl Margit Batte NFAOZ'H Date: 09/16/2018 Reason for consult: Follow-up assessment;Primapara;Early term 82-38.6wks Baby is 58 hours old and at a 8% weight loss.  Mom still having some difficulty with latching baby.  Breasts are filling and milk is easily hand expressed.  Assisted with positioning baby in football hold on right breast.  Baby latched easily and fed off and on for 10 minutes.  Baby content and relaxed after feeding.  Instructed to post pump every 3 hours and syringe feed expressed milk to baby.  Discussed WIC loaner at discharge.  Mom to feed with cues and call for assist prn.  Maternal Data    Feeding Feeding Type: Breast Fed  LATCH Score Latch: Grasps breast easily, tongue down, lips flanged, rhythmical sucking.  Audible Swallowing: A few with stimulation  Type of Nipple: Everted at rest and after stimulation  Comfort (Breast/Nipple): Soft / non-tender  Hold (Positioning): Assistance needed to correctly position infant at breast and maintain latch.  LATCH Score: 8  Interventions Interventions: Assisted with latch;Breast compression;Skin to skin;Adjust position;Breast massage;Support pillows;Hand express;Position options;DEBP  Lactation Tools Discussed/Used     Consult Status Consult Status: Follow-up Date: 09/17/18 Follow-up type: In-patient    Huston Foley 09/16/2018, 10:45 AM

## 2018-09-16 NOTE — Discharge Instructions (Signed)

## 2018-09-16 NOTE — Discharge Summary (Signed)
Postpartum Discharge Summary     Patient Name: Catherine Collins DOB: 1996/08/06 MRN: [id number]  Date of admission: 09/13/2018 Delivering Provider: Arvilla Market   Date of discharge: 09/16/2018  Admitting diagnosis: 38WKS NO MOVEMENT Intrauterine pregnancy: [redacted]w[redacted]d     Secondary diagnosis:  Active Problems:   History of depression   Late prenatal care   Chlamydia infection affecting pregnancy in second trimester   Gonorrhea affecting pregnancy in second trimester   IUGR (intrauterine growth restriction) affecting care of mother   History of tobacco use  Additional problems: N/A     Discharge diagnosis: Term Pregnancy Delivered                                                                                                Post partum procedures:N/A  Augmentation: AROM, Pitocin, Cytotec and Foley Balloon  Complications: None  Hospital course:  Induction of Labor With Vaginal Delivery   22 y.o. yo G1P1001 at [redacted]w[redacted]d was admitted to the hospital 09/13/2018 for induction of labor.  Indication for induction: IUGR.  Patient had an uncomplicated labor course as follows: Membrane Rupture Time/Date: 6:45 AM ,09/14/2018   Intrapartum Procedures: Episiotomy: None [1]                                         Lacerations:  Labial [10]  Patient had delivery of a Viable infant.  Information for the patient's newborn:  Alyssa, Rotondo [[id number]]  Delivery Method: Vag-Spont   09/14/2018  Details of delivery can be found in separate delivery note.  Patient had a routine postpartum course. Patient is discharged home 09/16/18.  Magnesium Sulfate recieved: No BMZ received: No  Physical exam  Vitals:   09/15/18 0655 09/15/18 1452 09/15/18 2211 09/16/18 0556  BP: 103/65 110/71 117/75 (!) 108/59  Pulse: 83 75 79 (!) 59  Resp: 18 17 19 18   Temp: 97.8 F (36.6 C)  98 F (36.7 C) 97.7 F (36.5 C)  TempSrc: Oral  Oral Oral  SpO2:      Weight:      Height:       General:  alert, cooperative and no distress Lochia: appropriate Uterine Fundus: firm Incision: N/A DVT Evaluation: No evidence of DVT seen on physical exam. Labs: Lab Results  Component Value Date   WBC 13.1 (H) 09/13/2018   HGB 11.7 (L) 09/13/2018   HCT 35.3 (L) 09/13/2018   MCV 95.1 09/13/2018   PLT 309 09/13/2018   No flowsheet data found.  Discharge instruction: per After Visit Summary and "Baby and Me Booklet".  After visit meds:  Allergies as of 09/16/2018   No Known Allergies     Medication List    STOP taking these medications   cyclobenzaprine 10 MG tablet Commonly known as:  FLEXERIL     TAKE these medications   acetaminophen 325 MG tablet Commonly known as:  TYLENOL Take 2 tablets (650 mg total) by mouth every 4 (four) hours as needed (for pain scale <  4). What changed:    medication strength  how much to take  when to take this  reasons to take this   ibuprofen 800 MG tablet Commonly known as:  ADVIL,MOTRIN Take 1 tablet (800 mg total) by mouth every 8 (eight) hours as needed.   OB COMPLETE PETITE 35-5-1-200 MG Caps Take 1 tablet by mouth daily.   witch hazel-glycerin pad Commonly known as:  TUCKS Apply 1 application topically as needed for hemorrhoids.       Diet: routine diet  Activity: Advance as tolerated. Pelvic rest for 6 weeks.   Outpatient follow up:4 weeks Follow up Appt: Future Appointments  Date Time Provider Department Center  10/12/2018  9:30 AM Sharyon Cable, CNM [facility] None   Follow up Visit:   Please schedule this patient for Postpartum visit in: 4 weeks with the following provider: Any provider For C/S patients schedule nurse incision check in weeks 2 weeks: no Low risk pregnancy complicated by: N/A Delivery mode:  SVD Anticipated Birth Control:  other/unsure. Does not wish to discuss at time of d/c visit. Referred to [email or web address] for options PP Procedures needed: None  Schedule Integrated BH visit: no  Newborn  Data: Live born female  Birth Weight: 6 lb 3.1 oz (2810 g) APGAR: 8, 9  Newborn Delivery   Birth date/time:  09/14/2018 12:58:00 Delivery type:  Vaginal, Spontaneous     Baby Feeding: pumping and syringe feeding at time of discharge Disposition:home with mother   09/16/2018 Calvert Cantor, CNM

## 2018-09-16 NOTE — Progress Notes (Signed)
Pt denies ques/concerns re: d/c instructions.  She is aware that she will now be discharged to self care & that any meds or supplies she needs for herself will not be provided by the staff.  Pt d/c in stable condition.

## 2018-09-16 NOTE — Progress Notes (Signed)
CSW received consult due to score 11 on [place] Depression Screen.    CSW met with [name] via bedside to provide any support needed. [name] was pleasant and appropriate during conversation. [name] was opening regarding having an anxiety disorder and experience anxiety throughout her pregnancy. [name] states she primarily felt anxious that she was going to hurt her baby. [name] states she has many supports she feels like she can speak to openly regarding her feelings. CSW went over [name] edinburgh depression screen at bedside and encouraged her to continuously think of these questions during her postpartum. [name] voiced no concerns at this time.   CSW provided education regarding Baby Blues vs PMADs and provided [name] with resources for mental health follow up.  CSW encouraged [name] to evaluate her mental health throughout the postpartum period with the use of the New Mom Checklist developed by Postpartum Progress as well as the [place] Postnatal Depression Scale and notify a medical professional if symptoms arise.     [name] voiced no concerns at this time.  Kingsley Spittle, [place]  [phone]

## 2018-09-16 NOTE — Lactation Note (Signed)
This note was copied from a baby's chart. Lactation Consultation Note  Patient Name: Catherine Collins ZOXWR'U Date: 09/16/2018 Reason for consult: Follow-up assessment;Early term 37-38.6wks P1, 39 hours female infant with weight loss -3%. LC entered room infant was sleeping and had recently been breast feed.  Per mom infant had 4 wet and 4 soiled diapers in past 24 hours. Mom is feeling more confident with breastfeeding, having help with latch w/ family member due poor muscle tone in left arm. Infant is latching well on right breast with football hold, per mom,  infant feed for 30 minutes. Per mom, try latching to left breast but difficult will explore other latch position that may work for her. Per mom, she did not like using the DEBP she prefers the hand pump instead.  LC had mom pump her left breast using  the cylinder hand pump, mom expressed 6 ml to be given to infant for a future feeding. Mom receptive receiving LC out patient clinic servings for help with BF when she is  discharge from hospital.   Maternal Data    Feeding Feeding Type: Breast Fed  LATCH Score                   Interventions    Lactation Tools Discussed/Used     Consult Status Consult Status: Follow-up Date: 09/16/18 Follow-up type: In-patient    Danelle Earthly 09/16/2018, 4:30 AM

## 2018-09-17 ENCOUNTER — Ambulatory Visit: Payer: Self-pay

## 2018-09-17 NOTE — Lactation Note (Signed)
This note was copied from a baby's chart. Lactation Consultation Note  Patient Name: Catherine Collins Date: 09/17/2018   [facility] loaner paperwork completed. Loaner pump # [id number] provided, along with map & instructions on when & where to return. Mom & MGM were shown how to assemble & use hand pump that was included in pump kit (single- & double-mode).   Sanitization of pump parts discussed. B/c of time of discharge, MGM requested some formula to last them until morning. Mom knows to pump every time formula is given to infant. She verbalized understanding. Mom also knows not to mix formula with EBM in the same bottle (but, rather, to give EBM first and then offer bottle of formula).   Matthias Hughs [facility] 09/17/2018, 9:05 PM

## 2018-09-17 NOTE — Lactation Note (Signed)
This note was copied from a baby's chart. Lactation Consultation Note  Patient Name: Girl Jameyah Fennewald ZOXWR'U Date: 09/17/2018 Reason for consult: Follow-up assessment;Infant weight loss;Early term 37-38.6wks;Primapara Assisted with paced bottle feeding.  Baby tolerated well.  Maternal Data    Feeding Feeding Type: Formula  LATCH Score                   Interventions    Lactation Tools Discussed/Used     Consult Status Consult Status: Complete Follow-up type: Call as needed    Huston Foley 09/17/2018, 11:14 AM

## 2018-09-17 NOTE — Lactation Note (Signed)
This note was copied from a baby's chart. Lactation Consultation Note  Patient Name: Catherine Collins ZOXWR'U Date: 09/17/2018 Reason for consult: Follow-up assessment;Infant weight loss;Early term 37-38.6wks;Primapara Weight loss is at 9% today. Mom is feeding with cues and then supplementing with expressed milk and formula with curved tip syringe.  Recommended a slow flow nipple now that volumes given have increased.  Mom states she feels feedings went better last night.  She is interested in a [facility] loaner pump at discharge.  Will follow up before discharge.  Lactation outpatient services and support reviewed and encouraged prn.  Maternal Data    Feeding Feeding Type: Formula  LATCH Score                   Interventions    Lactation Tools Discussed/Used     Consult Status Consult Status: Complete Follow-up type: Call as needed    Huston Foley 09/17/2018, 9:05 AM

## 2018-09-17 NOTE — Lactation Note (Signed)
This note was copied from a baby's chart. Lactation Consultation Note  Patient Name: Catherine Collins [id number] Date: 09/17/2018   Mom provided [facility] loaner paperwork. Mom has my # to call when ready for me to return.  Lurline Hare [facility] 09/17/2018, 7:39 PM

## 2018-09-20 ENCOUNTER — Inpatient Hospital Stay (HOSPITAL_COMMUNITY): Admission: RE | Admit: 2018-09-20 | Payer: Medicaid Other | Source: Ambulatory Visit

## 2018-09-20 ENCOUNTER — Ambulatory Visit (HOSPITAL_COMMUNITY): Payer: Medicaid Other

## 2018-09-20 ENCOUNTER — Encounter: Payer: Medicaid Other | Admitting: Obstetrics and Gynecology

## 2018-09-27 ENCOUNTER — Ambulatory Visit: Payer: Self-pay

## 2018-09-27 NOTE — Lactation Note (Signed)
This note was copied from a baby's chart. 09/27/2018  Name: Catherine Collins MRN: [id number] Date of Birth: 09/14/2016 Gestational Age: Gestational Age: [redacted]w[redacted]d Birth Weight: 99.1 oz Weight today:    6 pounds 10.8 ounces (3030 grams) with clean newborn diaper   67 day old Early term infant presents today with mom for feeding assessment. Infant is not latching to the breast and mom is pumping and bottle feeding. Infant fed about an hour prior to appt. Mom would like to exclusively BF infant.   Infant has gained 470 grams in the last 10 days with an average daily weight gain of 47 grams a day.   Mom reprots infant will latch for very brief periods. Mom is having difficulty latching infant independently. Worked on positioning and supporting the breast with feedings.   Mom is pumping 4 x a day and getting plenty of milk. Discussed supply and demand and enc mom to pump anytime infant is receiving a bottle. Mom is able to pump enough to feed infant EBM only.   Mom is to follow up with Radene Gunning, NP tomorrow at [facility] on [address]. Mom is being followed by [facility]. Mom informed of BF Support Groups. Family connects has been out to weight infant with no plans for follow up. Mom would like to follow up with Lactation in 2 weeks.   Mom reports she lives with her grandparents and has good support.   Mom reports all questions have been answered at this time. Mom to call with any questions/concerns as needed.    General Information: Mother's reason for visit: Feeding assessment Consult: Initial Lactation consultant: Noralee Stain RN,IBCLC Breastfeeding experience: will not latch, pumping and bottle feeding   Maternal medications: Pre-natal vitamin  Breastfeeding History: Frequency of breast feeding: once a day Duration of feeding: 2-3 minutes, has BF up to 30 minutes  Supplementation: Supplement method: bottle(Dr. Brown's )         Breast milk volume: 2-2.5 ounces Breast milk  frequency: every 2-3 hours, self awakens Total breast milk volume per day: about 24 ounces Pump type: Symphony Pump frequency: 4 x a day, 15 minutes, DEBP Pump volume: 8 ounces  Infant Output Assessment: Voids per 24 hours: 8+ Urine color: Clear yellow Stools per 24 hours: 8+ Stool color: Yellow  Breast Assessment: Breast: Soft, Compressible Nipple: Erect Pain level: 0 Pain interventions: Bra, Breast pump  Feeding Assessment: Infant oral assessment: Variance Infant oral assessment comment: infant with thick labial frenulum that inserts at the bottom of the gum ridge. Tongue with good mobility Positioning: Cross cradle(right breast) Latch: 0 - Too sleepy or reluctant, no latch achieved, no sucking elicited. Audible swallowing: 1 - A few with stimulation Type of nipple: 2 - Everted at rest and after stimulation Comfort: 2 - Soft/non-tender Hold: 1 - Assistance needed to correctly position infant at breast and maintain latch LATCH score: 6 Latch assessment: Deep Lips flanged: Yes Suck assessment: Displays both   Pre-feed weight: 3030 grams Post feed weight: 3030 grams Amount transferred: 0 Amount Supplemented: 60 ml EBM via bottle  Additional Feeding Assessment:                                    Totals: Total amount transferred: 0 Total supplement given: did not feed Total amount pumped post feed: did not pump   Plan:  1. Offer the breast with each feeding as mom and infant want,  the more she practices the better 2. Offer 1/2 ounce in the bottle if she is upset at the breast and then try to latch her 3. Keep infant awake at the breast with feeding 4. Support the infant on pillows with feedings 5. Place a rolled up blanket or washcloths under breasts to support the breast 6. Offer infant a bottle of pumped breast milk after breast feeding until she is satisfied 7. Continue using the the Dr. Theora Gianotti Bottle with feeding, feed using the paced bottle  feeding method (video on [email or web address]) 8. Infant needs about 56-75 ml (2-2.5 ounces) for 8 feedings a day or 450-600 ml (15-20 ounces) in 24 hours. Infant may eat more or less per feeding depending on how often infant feeds 9. Continue pumping 7-8 x a day with your [employer] Breast pump for 15-20 minutes or until breasts are empty. Pump anytime infant is getting a bottle to protect milk supply.  10. Keep up the good work 11. Thank you for allowing me to assist you today 12. Please call with any questions/concerns as needed [phone] 13. Follow up with Lactation in 2 weeks  Ed Blalock RN, IBCLC                                                     Ed Blalock 09/27/2018, 2:43 PM

## 2018-10-12 ENCOUNTER — Ambulatory Visit (INDEPENDENT_AMBULATORY_CARE_PROVIDER_SITE_OTHER): Payer: Medicaid Other | Admitting: Certified Nurse Midwife

## 2018-10-12 ENCOUNTER — Encounter: Payer: Self-pay | Admitting: Certified Nurse Midwife

## 2018-10-12 DIAGNOSIS — Z30013 Encounter for initial prescription of injectable contraceptive: Secondary | ICD-10-CM

## 2018-10-12 DIAGNOSIS — Z1389 Encounter for screening for other disorder: Secondary | ICD-10-CM

## 2018-10-12 MED ORDER — MEDROXYPROGESTERONE ACETATE 150 MG/ML IM SUSP: 150.0000 mg | INTRAMUSCULAR | 3 refills | Status: DC

## 2018-10-12 NOTE — Patient Instructions (Addendum)

## 2018-10-12 NOTE — Progress Notes (Signed)
Post Partum Exam  Catherine Collins is a 22 y.o. 381P1001 female who presents for a postpartum visit. She is 4 weeks postpartum following a spontaneous vaginal delivery. I have fully reviewed the prenatal and intrapartum course. The delivery was at 38 gestational weeks- IOL for IUGR and BPP 6/10.  Anesthesia: epidural. Postpartum course has been uncomplicated. Baby's course has been uncomplicated. Baby is feeding by both breast and bottle - [name]. Bleeding staining only. Bowel function is normal. Bladder function is normal. Patient is not sexually active. Contraception method is none. Postpartum depression screening:neg  The following portions of the patient's history were reviewed and updated as appropriate: allergies, current medications, past family history and problem list. She has never had pap smear, <21yo.   Review of Systems Pertinent items noted in HPI and remainder of comprehensive ROS otherwise negative.    Objective:  unknown if currently breastfeeding.  General:  alert, cooperative and no distress   Breasts:  inspection negative, no nipple discharge or bleeding, no masses or nodularity palpable  Lungs: clear to auscultation bilaterally  Heart:  regular rate and rhythm and S1, S2 normal  Abdomen: soft, non-tender; bowel sounds normal; no masses,  no organomegaly   Vulva:  not evaluated  Vagina: not evaluated  Cervix:  not evaluated  Corpus: not examined  Adnexa:  not evaluated  Rectal Exam: Not performed.        Assessment/Plan:  1. Postpartum care and examination -Normal postpartum exam. Pap smear not done at today's visit. Pap smear due September 2020 when patient is 23yo.    2. Encounter for initial prescription of injectable contraceptive - Educated and discussed birth control options in detail, answered patient's questions. Patient agree to Depo injection at this time.  - Patient unable to urinate during office visit, unable to quick start medication in office  today - Rx for initiation sent to pharmacy and patient plans to return on 11/19 for initiation of contraception  - medroxyPROGESTERone (DEPO-PROVERA) 150 MG/ML injection; Inject 1 mL (150 mg total) into the muscle every 3 (three) months.  Dispense: 1 mL; Refill: 3 - Pregnancy, urine  Follow up on 11/19 for initiation of depo   Sharyon CableVeronica C Shaquaya Wuellner, CNM 10/12/18, 11:07 AM

## 2018-10-18 ENCOUNTER — Ambulatory Visit (INDEPENDENT_AMBULATORY_CARE_PROVIDER_SITE_OTHER): Payer: Medicaid Other

## 2018-10-18 DIAGNOSIS — Z3042 Encounter for surveillance of injectable contraceptive: Secondary | ICD-10-CM

## 2018-10-18 DIAGNOSIS — Z3202 Encounter for pregnancy test, result negative: Secondary | ICD-10-CM

## 2018-10-18 DIAGNOSIS — Z30013 Encounter for initial prescription of injectable contraceptive: Secondary | ICD-10-CM

## 2018-10-18 LAB — POCT URINE PREGNANCY: Preg Test, Ur: NEGATIVE

## 2018-10-18 MED ORDER — MEDROXYPROGESTERONE ACETATE 150 MG/ML IM SUSP
150.0000 mg | Freq: Once | INTRAMUSCULAR | Status: AC
  Administered 2018-10-18: 150 mg via INTRAMUSCULAR

## 2018-10-18 NOTE — Progress Notes (Signed)
Agree with A & P. 

## 2018-10-18 NOTE — Progress Notes (Signed)
Pt here for initial postpartum depo injection. Pregnancy test today is negative. Inj given in left deltoid. Pt tolerated well. Next inj due 2/4-2/19.

## 2019-01-03 ENCOUNTER — Ambulatory Visit: Payer: Medicaid Other | Admitting: Obstetrics

## 2019-01-03 ENCOUNTER — Other Ambulatory Visit (HOSPITAL_COMMUNITY)
Admission: RE | Admit: 2019-01-03 | Discharge: 2019-01-03 | Disposition: A | Discharge status: Home / Self Care | Payer: Medicaid Other | Source: Ambulatory Visit | Attending: Obstetrics | Admitting: Obstetrics

## 2019-01-03 ENCOUNTER — Ambulatory Visit (INDEPENDENT_AMBULATORY_CARE_PROVIDER_SITE_OTHER): Payer: Medicaid Other | Admitting: Obstetrics

## 2019-01-03 ENCOUNTER — Encounter: Payer: Self-pay | Admitting: Obstetrics

## 2019-01-03 VITALS — BP 115/79 | HR 95 | Wt 119.6 lb

## 2019-01-03 DIAGNOSIS — Z0000 Encounter for general adult medical examination without abnormal findings: Secondary | ICD-10-CM | POA: Diagnosis not present

## 2019-01-03 DIAGNOSIS — Z30011 Encounter for initial prescription of contraceptive pills: Secondary | ICD-10-CM

## 2019-01-03 DIAGNOSIS — N898 Other specified noninflammatory disorders of vagina: Secondary | ICD-10-CM

## 2019-01-03 DIAGNOSIS — Z3009 Encounter for other general counseling and advice on contraception: Secondary | ICD-10-CM

## 2019-01-03 DIAGNOSIS — Z01419 Encounter for gynecological examination (general) (routine) without abnormal findings: Secondary | ICD-10-CM

## 2019-01-03 DIAGNOSIS — N939 Abnormal uterine and vaginal bleeding, unspecified: Secondary | ICD-10-CM

## 2019-01-03 MED ORDER — LEVONORGEST-ETH ESTRADIOL-IRON 0.1-20 MG-MCG(21) PO TABS: 1.0000 | ORAL_TABLET | Freq: Every day | ORAL | 11 refills | Status: DC

## 2019-01-03 NOTE — Progress Notes (Signed)
Subjective:        Catherine Collins is a 23 y.o. female here for a routine exam.  Current complaints: Continuous spotting on Depo Provera Injections.  Malodorous vaginal discharge.  Personal health questionnaire:  Is patient Ashkenazi Jewish, have a family history of breast and/or ovarian cancer: no Is there a family history of uterine cancer diagnosed at age < 4750, gastrointestinal cancer, urinary tract cancer, family member who is a Personnel officerLynch syndrome-associated carrier: no Is the patient overweight and hypertensive, family history of diabetes, personal history of gestational diabetes, preeclampsia or PCOS: no Is patient over 1655, have PCOS,  family history of premature CHD under age 21, diabetes, smoke, have hypertension or peripheral artery disease:  no At any time, has a partner hit, kicked or otherwise hurt or frightened you?: no Over the past 2 weeks, have you felt down, depressed or hopeless?: no Over the past 2 weeks, have you felt little interest or pleasure in doing things?:no   Gynecologic History Patient's last menstrual period was 11/21/2018 (exact date). Contraception: Depo-Provera injections Last Pap: n/a. Results were: n/a Last mammogram: n/a. Results were: n/a  Obstetric History OB History  Gravida Para Term Preterm AB Living  1 1 1     1   SAB TAB Ectopic Multiple Live Births        0 1    # Outcome Date GA Lbr Len/2nd Weight Sex Delivery Anes PTL Lv  1 Term 09/14/18 [id number] 01:30 / 01:23 6 lb 3.1 oz (2.81 kg) F Vag-Spont EPI  LIV    Past Medical History:  Diagnosis Date  . Anxiety   . Mental disorder     Past Surgical History:  Procedure Laterality Date  . WISDOM TOOTH EXTRACTION       Current Outpatient Medications:  .  acetaminophen (TYLENOL) 325 MG tablet, Take 2 tablets (650 mg total) by mouth every 4 (four) hours as needed (for pain scale < 4). (Patient not taking: Reported on 01/03/2019), Disp: 30 tablet, Rfl: 3 .  ibuprofen (ADVIL,MOTRIN) 800 MG  tablet, Take 1 tablet (800 mg total) by mouth every 8 (eight) hours as needed. (Patient not taking: Reported on 01/03/2019), Disp: 30 tablet, Rfl: 0 .  Levonorgest-Eth Estrad-Fe Bisg (BALCOLTRA) 0.1-20 MG-MCG(21) TABS, Take 1 tablet by mouth daily., Disp: 28 tablet, Rfl: 11 .  Prenat-FeCbn-FeAspGl-FA-Omega (OB COMPLETE PETITE) 35-5-1-200 MG CAPS, Take 1 tablet by mouth daily., Disp: 30 capsule, Rfl: 12 .  witch hazel-glycerin (TUCKS) pad, Apply 1 application topically as needed for hemorrhoids. (Patient not taking: Reported on 01/03/2019), Disp: 40 each, Rfl: 12 No Known Allergies  Social History   Tobacco Use  . Smoking status: Former Smoker    Packs/day: 0.50    Types: Cigarettes    Last attempt to quit: 02/11/2018    Years since quitting: 0.8  . Smokeless tobacco: Never Used  . Tobacco comment: stopped smoking after preg confirm  Substance Use Topics  . Alcohol use: No    Alcohol/week: 0.0 standard drinks    Family History  Problem Relation Age of Onset  . Drug abuse Maternal Grandfather       Review of Systems  Constitutional: negative for fatigue and weight loss Respiratory: negative for cough and wheezing Cardiovascular: negative for chest pain, fatigue and palpitations Gastrointestinal: negative for abdominal pain and change in bowel habits Musculoskeletal:negative for myalgias Neurological: negative for gait problems and tremors Behavioral/Psych: negative for abusive relationship, depression Endocrine: negative for temperature intolerance    Genitourinary:negative for  abnormal menstrual periods, genital lesions, hot flashes, sexual problems and vaginal discharge Integument/breast: negative for breast lump, breast tenderness, nipple discharge and skin lesion(s)    Objective:       BP 115/79   Pulse 95   Wt 119 lb 9.6 oz (54.3 kg)   LMP 11/21/2018 (Exact Date)   Breastfeeding No   BMI 21.88 kg/m  General:   alert  Skin:   no rash or abnormalities  Lungs:   clear to  auscultation bilaterally  Heart:   regular rate and rhythm, S1, S2 normal, no murmur, click, rub or gallop  Breasts:   normal without suspicious masses, skin or nipple changes or axillary nodes  Abdomen:  normal findings: no organomegaly, soft, non-tender and no hernia  Pelvis:  External genitalia: normal general appearance Urinary system: urethral meatus normal and bladder without fullness, nontender Vaginal: normal without tenderness, induration or masses Cervix: normal appearance Adnexa: normal bimanual exam Uterus: anteverted and non-tender, normal size   Lab Review Urine pregnancy test Labs reviewed yes Radiologic studies reviewed no  50% of 20 min visit spent on counseling and coordination of care.   Assessment:     1. Encounter for gynecological examination  2. Vaginal discharge Rx: - Cervicovaginal ancillary only( [facility])  3. Abnormal uterine bleeding (AUB) - discontinue Depo Provera  4. Encounter for other general counseling and advice on contraception - wants to start OCP's  5. Encounter for initial prescription of contraceptive pills Rx: - Levonorgest-Eth Estrad-Fe Bisg (BALCOLTRA) 0.1-20 MG-MCG(21) TABS; Take 1 tablet by mouth daily.  Dispense: 28 tablet; Refill: 11    Plan:    Education reviewed: calcium supplements, depression evaluation, low fat, low cholesterol diet, safe sex/STD prevention, self breast exams and weight bearing exercise. Contraception: OCP (estrogen/progesterone). Follow up in: 6 months.   Meds ordered this encounter  Medications  . Levonorgest-Eth Estrad-Fe Bisg (BALCOLTRA) 0.1-20 MG-MCG(21) TABS    Sig: Take 1 tablet by mouth daily.    Dispense:  28 tablet    Refill:  11   No orders of the defined types were placed in this encounter.   Brock Bad MD 01-03-2019   Presents for AEX/ AUB with DEPO.  C/o brown, malodorous discharge, lower abdminal pain 3-4/10, nausea x 2 months.  Denies fever, chills.

## 2019-01-05 ENCOUNTER — Other Ambulatory Visit: Payer: Self-pay | Admitting: Obstetrics

## 2019-01-05 DIAGNOSIS — A5901 Trichomonal vulvovaginitis: Secondary | ICD-10-CM

## 2019-01-05 DIAGNOSIS — A749 Chlamydial infection, unspecified: Secondary | ICD-10-CM

## 2019-01-05 LAB — CERVICOVAGINAL ANCILLARY ONLY
Bacterial vaginitis: POSITIVE — AB
CANDIDA VAGINITIS: NEGATIVE
CHLAMYDIA, DNA PROBE: POSITIVE — AB
NEISSERIA GONORRHEA: NEGATIVE
Trichomonas: POSITIVE — AB

## 2019-01-05 MED ORDER — CEFIXIME 400 MG PO CAPS: 400.0000 mg | ORAL_CAPSULE | Freq: Once | ORAL | 0 refills | Status: AC

## 2019-01-05 MED ORDER — TINIDAZOLE 500 MG PO TABS: 2.0000 g | ORAL_TABLET | Freq: Every day | ORAL | 0 refills | Status: DC

## 2019-01-05 MED ORDER — AZITHROMYCIN 500 MG PO TABS: 1000.0000 mg | ORAL_TABLET | Freq: Once | ORAL | 0 refills | Status: AC

## 2019-01-11 ENCOUNTER — Telehealth: Payer: Self-pay | Admitting: *Deleted

## 2019-01-11 NOTE — Telephone Encounter (Signed)
Pt called to office with question about Rx.  Return call to pt. Pt states that she did not get Suprax Rx and would like to know if she needs it. Pt made aware that Suprax is not available to get at this time. Pt advised that the medications she did get will treat current infections.

## 2019-02-01 ENCOUNTER — Ambulatory Visit (INDEPENDENT_AMBULATORY_CARE_PROVIDER_SITE_OTHER): Payer: Medicaid Other | Admitting: Obstetrics

## 2019-02-01 ENCOUNTER — Other Ambulatory Visit (HOSPITAL_COMMUNITY)
Admission: RE | Admit: 2019-02-01 | Discharge: 2019-02-01 | Disposition: A | Discharge status: Home / Self Care | Payer: Medicaid Other | Source: Ambulatory Visit | Attending: Obstetrics | Admitting: Obstetrics

## 2019-02-01 ENCOUNTER — Encounter: Payer: Self-pay | Admitting: Obstetrics

## 2019-02-01 VITALS — BP 123/88 | HR 100 | Wt 115.8 lb

## 2019-02-01 DIAGNOSIS — Z0000 Encounter for general adult medical examination without abnormal findings: Secondary | ICD-10-CM

## 2019-02-01 DIAGNOSIS — Z113 Encounter for screening for infections with a predominantly sexual mode of transmission: Secondary | ICD-10-CM

## 2019-02-01 DIAGNOSIS — A5901 Trichomonal vulvovaginitis: Secondary | ICD-10-CM | POA: Insufficient documentation

## 2019-02-01 DIAGNOSIS — A749 Chlamydial infection, unspecified: Secondary | ICD-10-CM

## 2019-02-01 DIAGNOSIS — N898 Other specified noninflammatory disorders of vagina: Secondary | ICD-10-CM

## 2019-02-01 DIAGNOSIS — N926 Irregular menstruation, unspecified: Secondary | ICD-10-CM

## 2019-02-01 DIAGNOSIS — N946 Dysmenorrhea, unspecified: Secondary | ICD-10-CM

## 2019-02-01 DIAGNOSIS — Z3041 Encounter for surveillance of contraceptive pills: Secondary | ICD-10-CM

## 2019-02-01 DIAGNOSIS — Z3202 Encounter for pregnancy test, result negative: Secondary | ICD-10-CM

## 2019-02-01 LAB — POCT URINE PREGNANCY: PREG TEST UR: NEGATIVE

## 2019-02-01 MED ORDER — OB COMPLETE PETITE 35-5-1-200 MG PO CAPS: 1.0000 | ORAL_CAPSULE | Freq: Every day | ORAL | 12 refills | Status: DC

## 2019-02-01 MED ORDER — IBUPROFEN 800 MG PO TABS: 800.0000 mg | ORAL_TABLET | Freq: Three times a day (TID) | ORAL | 5 refills | Status: DC | PRN

## 2019-02-01 NOTE — Progress Notes (Signed)
Pt is here for f/u, TOC. Pt states she did not take birth control pills, has not had period since December.

## 2019-02-01 NOTE — Patient Instructions (Signed)
Oral Contraception Information  Oral contraceptive pills (OCPs) are medicines taken to prevent pregnancy. OCPs are taken by mouth, and they work by:  · Preventing the ovaries from releasing eggs.  · Thickening mucus in the lower part of the uterus (cervix), which prevents sperm from entering the uterus.  · Thinning the lining of the uterus (endometrium), which prevents a fertilized egg from attaching to the endometrium.  OCPs are highly effective when taken exactly as prescribed. However, OCPs do not prevent STIs (sexually transmitted infections). Safe sex practices, such as using condoms while on an OCP, can help prevent STIs.  Before starting OCPs  Before you start taking OCPs, you may have a physical exam, blood test, and Pap test. However, you are not required to have a pelvic exam in order to be prescribed OCPs. Your health care provider will make sure you are a good candidate for oral contraception. OCPs are not a good option for certain women, including women who smoke and are older than 35 years, and women with a medical history of high blood pressure, deep vein thrombosis, pulmonary embolism, stroke, cardiovascular disease, or peripheral vascular disease.  Discuss with your health care provider the possible side effects of the OCP you may be prescribed. When you start an OCP, be aware that it can take 2-3 months for your body to adjust to changes in hormone levels.  Follow instructions from your health care provider about how to start taking your first cycle of OCPs. Depending on when you start the pill, you may need to use a backup form of birth control, such as condoms, during the first week. Make sure you know what steps to take if you ever forget to take the pill.  Types of oral contraception    The most common types of birth control pills contain the hormones estrogen and progestin (synthetic progesterone) or progestin only.  The combination pill  This type of pill contains estrogen and progestin  hormones. Combination pills often come in packs of 21, 28, or 91 pills. For each pack, the last 7 pills may not contain hormones, which means you may stop taking the pills for 7 days. Menstrual bleeding occurs during the week that you do not take the pills or that you take the pills with no hormones in them.  The minipill  This type of pill contains the progestin hormone only. It comes in packs of 28 pills. All 28 pills contain the hormone. You take the pill every day. It is very important to take the pill at the same time each day.  Advantages of oral contraceptive pills  · Provides reliable and continuous contraception if taken as instructed.  · May treat or decrease symptoms of:  ? Menstrual period cramps.  ? Irregular menstrual cycle or bleeding.  ? Heavy menstrual flow.  ? Abnormal uterine bleeding.  ? Acne, depending on the type of pill.  ? Polycystic ovarian syndrome.  ? Endometriosis.  ? Iron deficiency anemia.  ? Premenstrual symptoms, including premenstrual dysphoric disorder.  · May reduce the risk of endometrial and ovarian cancer.  · Can be used as emergency contraception.  · Prevents mislocated (ectopic) pregnancies and infections of the fallopian tubes.  Things that can make oral contraceptive pills less effective  OCPs can be less effective if:  · You forget to take the pill at the same time every day. This is especially important when taking the minipill.  · You have a stomach or intestinal disease that reduces   your body's ability to absorb the pill.  · You take OCPs with other medicines that make OCPs less effective, such as antibiotics, certain HIV medicines, and some seizure medicines.  · You take expired OCPs.  · You forget to restart the pill on day 7, if using the packs of 21 pills.  Risks associated with oral contraceptive pills  Oral contraceptive pills can sometimes cause side effects, such as:  · Headache.  · Depression.  · Trouble sleeping.  · Nausea and vomiting.  · Breast  tenderness.  · Irregular bleeding or spotting during the first several months.  · Bloating or fluid retention.  · Increase in blood pressure.  Combination pills are also associated with a small increase in the risk of:  · Blood clots.  · Heart attack.  · Stroke.  Summary  · Oral contraceptive pills are medicines taken by mouth to prevent pregnancy. They are highly effective when taken exactly as prescribed.  · The most common types of birth control pills contain the hormones estrogen and progestin (synthetic progesterone) or progestin only.  · Before you start taking the pill, you may have a physical exam, blood test, and Pap test. Your health care provider will make sure you are a good candidate for oral contraception.  · The combination pill may come in a 21-day pack, a 28-day pack, or a 91-day pack. The minipill contains the progesterone hormone only and comes in packs of 28 pills.  · Oral contraceptive pills can sometimes cause side effects, such as headache, nausea, breast tenderness, or irregular bleeding.  This information is not intended to replace advice given to you by your health care provider. Make sure you discuss any questions you have with your health care provider.  Document Released: 02/06/2003 Document Revised: 02/09/2017 Document Reviewed: 02/09/2017  Elsevier Interactive Patient Education © 2019 [employer]    Oral Contraception Use  Oral contraceptive pills (OCPs) are medicines that you take to prevent pregnancy. OCPs work by:  · Preventing the ovaries from releasing eggs.  · Thickening mucus in the lower part of the uterus (cervix), which prevents sperm from entering the uterus.  · Thinning the lining of the uterus (endometrium), which prevents a fertilized egg from attaching to the endometrium.  OCPs are highly effective when taken exactly as prescribed. However, OCPs do not prevent sexually transmitted infections (STIs). Safe sex practices, such as using condoms while on an OCP, can help  prevent STIs.  Before taking OCPs, you may have a physical exam, blood test, and Pap test. A Pap test involves taking a sample of cells from your cervix to check for cancer. Discuss with your health care provider the possible side effects of the OCP you may be prescribed. When you start an OCP, be aware that it can take 2-3 months for your body to adjust to changes in hormone levels.  How to take oral contraceptive pills  Follow instructions from your health care provider about how to start taking your first cycle of OCPs. Your health care provider may recommend that you:  · Start the pill on day 1 of your menstrual period. If you start at this time, you will not need any backup form of birth control (contraception), such as condoms.  · Start the pill on the first Sunday after your menstrual period or on the day you get your prescription. In these cases, you will need to use backup contraception for the first week.  · Start the pill at any   time of your cycle.  ? If you take the pill within 5 days of the start of your period, you will not need a backup form of contraception.  ? If you start at any other time of your menstrual cycle, you will need to use another form of contraception for 7 days. If your OCP is the type called a minipill, it will protect you from pregnancy after taking it for 2 days (48 hours), and you can stop using backup contraception after that time.  After you have started taking OCPs:  · If you forget to take 1 pill, take it as soon as you remember. Take the next pill at the regular time.  · If you miss 2 or more pills, call your health care provider. Different pills have different instructions for missed doses. Use backup birth control until your next menstrual period starts.  · If you use a 28-day pack that contains inactive pills and you miss 1 of the last 7 pills (pills with no hormones), throw away the rest of the non-hormone pills and start a new pill pack.  No matter which day you start  the OCP, you will always start a new pack on that same day of the week. Have an extra pack of OCPs and a backup contraceptive method available in case you miss some pills or lose your OCP pack.  Follow these instructions at home:  · Do not use any products that contain nicotine or tobacco, such as cigarettes and e-cigarettes. If you need help quitting, ask your health care provider.  · Always use a condom to protect against STIs. OCPs do not protect against STIs.  · Use a calendar to mark the days of your menstrual period.  · Read the information and directions that came with your OCP. Talk to your health care provider if you have questions.  Contact a health care provider if:  · You develop nausea and vomiting.  · You have abnormal vaginal discharge or bleeding.  · You develop a rash.  · You miss your menstrual period. Depending on the type of OCP you are taking, this may be a sign of pregnancy. Ask your health care provider for more information.  · You are losing your hair.  · You need treatment for mood swings or depression.  · You get dizzy when taking the OCP.  · You develop acne after taking the OCP.  · You become pregnant or think you may be pregnant.  · You have diarrhea, constipation, and abdominal pain or cramps.  · You miss 2 or more pills.  Get help right away if:  · You develop chest pain.  · You develop shortness of breath.  · You have an uncontrolled or severe headache.  · You develop numbness or slurred speech.  · You develop visual or speech problems.  · You develop pain, redness, and swelling in your legs.  · You develop weakness or numbness in your arms or legs.  Summary  · Oral contraceptive pills (OCPs) are medicines that you take to prevent pregnancy.  · OCPs do not prevent sexually transmitted infections (STIs). Always use a condom to protect against STIs.  · When you start an OCP, be aware that it can take 2-3 months for your body to adjust to changes in hormone levels.  · Read all the  information and directions that come with your OCP.  This information is not intended to replace advice given to you by your health   care provider. Make sure you discuss any questions you have with your health care provider.  Document Released: 11/05/2011 Document Revised: 12/28/2016 Document Reviewed: 12/28/2016  Elsevier Interactive Patient Education © 2019 [employer] Inc[employer]

## 2019-02-01 NOTE — Progress Notes (Signed)
Patient ID: Catherine Collins, female   DOB: 03-21-1996, 23 y.o.   MRN: [id number]  Chief Complaint  Patient presents with  . Follow-up    HPI Catherine Collins is a 23 y.o. female.  Irregular cycles HPI  Past Medical History:  Diagnosis Date  . Anxiety   . Mental disorder     Past Surgical History:  Procedure Laterality Date  . WISDOM TOOTH EXTRACTION      Family History  Problem Relation Age of Onset  . Drug abuse Maternal Grandfather     Social History Social History   Tobacco Use  . Smoking status: Former Smoker    Packs/day: 0.50    Types: Cigarettes    Last attempt to quit: 02/11/2018    Years since quitting: 0.9  . Smokeless tobacco: Never Used  . Tobacco comment: stopped smoking after preg confirm  Substance Use Topics  . Alcohol use: No    Alcohol/week: 0.0 standard drinks  . Drug use: No    No Known Allergies  Current Outpatient Medications  Medication Sig Dispense Refill  . acetaminophen (TYLENOL) 325 MG tablet Take 2 tablets (650 mg total) by mouth every 4 (four) hours as needed (for pain scale < 4). (Patient not taking: Reported on 01/03/2019) 30 tablet 3  . ibuprofen (ADVIL,MOTRIN) 800 MG tablet Take 1 tablet (800 mg total) by mouth every 8 (eight) hours as needed. 30 tablet 5  . Levonorgest-Eth Estrad-Fe Bisg (BALCOLTRA) 0.1-20 MG-MCG(21) TABS Take 1 tablet by mouth daily. (Patient not taking: Reported on 02/01/2019) 28 tablet 11  . Prenat-FeCbn-FeAspGl-FA-Omega (OB COMPLETE PETITE) 35-5-1-200 MG CAPS Take 1 tablet by mouth daily. 30 capsule 12  . tinidazole (TINDAMAX) 500 MG tablet Take 4 tablets (2,000 mg total) by mouth daily with breakfast. (Patient not taking: Reported on 02/01/2019) 8 tablet 0  . witch hazel-glycerin (TUCKS) pad Apply 1 application topically as needed for hemorrhoids. (Patient not taking: Reported on 01/03/2019) 40 each 12   No current facility-administered medications for this visit.     Review of Systems Review of  Systems Constitutional: negative for fatigue and weight loss Respiratory: negative for cough and wheezing Cardiovascular: negative for chest pain, fatigue and palpitations Gastrointestinal: negative for abdominal pain and change in bowel habits Genitourinary:positive for irregular cycles Integument/breast: negative for nipple discharge Musculoskeletal:negative for myalgias Neurological: negative for gait problems and tremors Behavioral/Psych: negative for abusive relationship, depression Endocrine: negative for temperature intolerance      Blood pressure 123/88, pulse 100, weight 115 lb 12.8 oz (52.5 kg), last menstrual period 11/15/2018, not currently breastfeeding.  Physical Exam Physical Exam           General:  Alert and no distress Abdomen:  normal findings: no organomegaly, soft, non-tender and no hernia  Pelvis:  External genitalia: normal general appearance Urinary system: urethral meatus normal and bladder without fullness, nontender Vaginal: normal without tenderness, induration or masses Cervix: normal appearance Adnexa: normal bimanual exam Uterus: anteverted and non-tender, normal size    50% of 15 min visit spent on counseling and coordination of care.   Data Reviewed Wet Prep and Cultures  Assessment       1. Chlamydial infection, treated - TOC cultures done  2. Trichomonas vaginitis, treated  - TOC cultures done  3. Vaginal discharge Rx: - Cervicovaginal ancillary only( [place])  4. Screening for STD (sexually transmitted disease)  5. Dysmenorrhea Rx: - ibuprofen (ADVIL,MOTRIN) 800 MG tablet; Take 1 tablet (800 mg total) by mouth every 8 (  eight) hours as needed.  Dispense: 30 tablet; Refill: 5  6. Irregular periods/menstrual cycles - needs OCP regulation of cycles  7. Encounter for surveillance of contraceptive pills.  She's not taking pills. Rx: - POCT urine pregnancy  8. Routine adult health maintenance Rx: - Prenat-FeCbn-FeAspGl-FA-Omega  (OB COMPLETE PETITE) 35-5-1-200 MG CAPS; Take 1 tablet by mouth daily.  Dispense: 30 capsule; Refill: 12  Plan    FOLLOW UP IN 6 MONTHS FOR ANNUAL / PAP   Orders Placed This Encounter  Procedures  . POCT urine pregnancy   Meds ordered this encounter  Medications  . ibuprofen (ADVIL,MOTRIN) 800 MG tablet    Sig: Take 1 tablet (800 mg total) by mouth every 8 (eight) hours as needed.    Dispense:  30 tablet    Refill:  5  . Prenat-FeCbn-FeAspGl-FA-Omega (OB COMPLETE PETITE) 35-5-1-200 MG CAPS    Sig: Take 1 tablet by mouth daily.    Dispense:  30 capsule    Refill:  12    Brock Bad MD 02-01-2019

## 2019-02-02 LAB — CERVICOVAGINAL ANCILLARY ONLY
BACTERIAL VAGINITIS: POSITIVE — AB
CANDIDA VAGINITIS: POSITIVE — AB
CHLAMYDIA, DNA PROBE: NEGATIVE
Neisseria Gonorrhea: NEGATIVE
Trichomonas: NEGATIVE

## 2019-02-03 ENCOUNTER — Other Ambulatory Visit: Payer: Self-pay | Admitting: Obstetrics

## 2019-02-03 DIAGNOSIS — A5901 Trichomonal vulvovaginitis: Secondary | ICD-10-CM

## 2019-02-03 DIAGNOSIS — N760 Acute vaginitis: Secondary | ICD-10-CM

## 2019-02-03 DIAGNOSIS — B9689 Other specified bacterial agents as the cause of diseases classified elsewhere: Secondary | ICD-10-CM

## 2019-02-03 DIAGNOSIS — B3731 Acute candidiasis of vulva and vagina: Secondary | ICD-10-CM

## 2019-02-03 DIAGNOSIS — B373 Candidiasis of vulva and vagina: Secondary | ICD-10-CM

## 2019-02-03 MED ORDER — TINIDAZOLE 500 MG PO TABS: 1000.0000 mg | ORAL_TABLET | Freq: Every day | ORAL | 2 refills | Status: DC

## 2019-02-03 MED ORDER — FLUCONAZOLE 150 MG PO TABS: 150.0000 mg | ORAL_TABLET | Freq: Once | ORAL | 2 refills | Status: AC

## 2019-04-20 ENCOUNTER — Telehealth: Payer: Self-pay | Admitting: Obstetrics and Gynecology

## 2019-04-28 ENCOUNTER — Ambulatory Visit: Payer: Medicaid Other

## 2019-05-15 NOTE — Telephone Encounter (Signed)
Opened in error

## 2019-05-22 ENCOUNTER — Ambulatory Visit (INDEPENDENT_AMBULATORY_CARE_PROVIDER_SITE_OTHER): Payer: Medicaid Other

## 2019-05-22 ENCOUNTER — Other Ambulatory Visit: Payer: Self-pay

## 2019-05-22 DIAGNOSIS — N912 Amenorrhea, unspecified: Secondary | ICD-10-CM

## 2019-05-22 DIAGNOSIS — Z3202 Encounter for pregnancy test, result negative: Secondary | ICD-10-CM

## 2019-05-22 LAB — POCT URINE PREGNANCY: Preg Test, Ur: NEGATIVE

## 2019-05-22 NOTE — Progress Notes (Signed)
I have reviewed the chart and agree with nursing staff's documentation of this patient's encounter.  Mora Bellman, MD 05/22/2019 1:48 PM

## 2019-05-22 NOTE — Progress Notes (Signed)
Pt presents for a pregnancy test. LMP 03/02/19-03/06/19. Pt states that has had some cramping. She is not taking birth control. She does have a history of irregular cycles. She has been experiencing some brown discharge. Pt advised to take another HPT early next week to see if anything changes. Advised to call us if she gets a positive to schedule confirmation nurse visit.

## 2019-06-09 ENCOUNTER — Other Ambulatory Visit: Payer: Self-pay

## 2019-06-09 ENCOUNTER — Ambulatory Visit (INDEPENDENT_AMBULATORY_CARE_PROVIDER_SITE_OTHER): Payer: Medicaid Other | Admitting: Obstetrics

## 2019-06-09 ENCOUNTER — Encounter: Payer: Self-pay | Admitting: Obstetrics

## 2019-06-09 ENCOUNTER — Other Ambulatory Visit (HOSPITAL_COMMUNITY)
Admission: RE | Admit: 2019-06-09 | Discharge: 2019-06-09 | Disposition: A | Discharge status: Home / Self Care | Payer: Medicaid Other | Source: Ambulatory Visit | Attending: Obstetrics | Admitting: Obstetrics

## 2019-06-09 VITALS — BP 111/74 | HR 68 | Wt 109.0 lb

## 2019-06-09 DIAGNOSIS — N946 Dysmenorrhea, unspecified: Secondary | ICD-10-CM | POA: Diagnosis not present

## 2019-06-09 DIAGNOSIS — N898 Other specified noninflammatory disorders of vagina: Secondary | ICD-10-CM | POA: Diagnosis present

## 2019-06-09 MED ORDER — IBUPROFEN 800 MG PO TABS: 800.0000 mg | ORAL_TABLET | Freq: Three times a day (TID) | ORAL | 5 refills | Status: DC | PRN

## 2019-06-09 MED ORDER — TINIDAZOLE 500 MG PO TABS: 1000.0000 mg | ORAL_TABLET | Freq: Every day | ORAL | 2 refills | Status: DC

## 2019-06-09 NOTE — Progress Notes (Signed)
Patient ID: Catherine Collins, female   DOB: 07-31-1996, 23 y.o.   MRN: [id number]  Chief Complaint  Patient presents with  . Vaginitis    HPI Catherine Collins is a 23 y.o. female.  Malodorous vaginal discharge.  HPI  Past Medical History:  Diagnosis Date  . Anxiety   . Mental disorder     Past Surgical History:  Procedure Laterality Date  . WISDOM TOOTH EXTRACTION      Family History  Problem Relation Age of Onset  . Drug abuse Maternal Grandfather     Social History Social History   Tobacco Use  . Smoking status: Former Smoker    Packs/day: 0.50    Types: Cigarettes    Quit date: 02/11/2018    Years since quitting: 1.3  . Smokeless tobacco: Never Used  . Tobacco comment: stopped smoking after preg confirm  Substance Use Topics  . Alcohol use: No    Alcohol/week: 0.0 standard drinks  . Drug use: No    No Known Allergies  Current Outpatient Medications  Medication Sig Dispense Refill  . acetaminophen (TYLENOL) 325 MG tablet Take 2 tablets (650 mg total) by mouth every 4 (four) hours as needed (for pain scale < 4). (Patient not taking: Reported on 01/03/2019) 30 tablet 3  . ibuprofen (ADVIL) 800 MG tablet Take 1 tablet (800 mg total) by mouth every 8 (eight) hours as needed. 30 tablet 5  . ibuprofen (ADVIL,MOTRIN) 800 MG tablet Take 1 tablet (800 mg total) by mouth every 8 (eight) hours as needed. 30 tablet 5  . Levonorgest-Eth Estrad-Fe Bisg (BALCOLTRA) 0.1-20 MG-MCG(21) TABS Take 1 tablet by mouth daily. (Patient not taking: Reported on 02/01/2019) 28 tablet 11  . Prenat-FeCbn-FeAspGl-FA-Omega (OB COMPLETE PETITE) 35-5-1-200 MG CAPS Take 1 tablet by mouth daily. 30 capsule 12  . tinidazole (TINDAMAX) 500 MG tablet Take 2 tablets (1,000 mg total) by mouth daily with breakfast. 10 tablet 2   No current facility-administered medications for this visit.     Review of Systems Review of Systems Constitutional: negative for fatigue and weight loss Respiratory: negative  for cough and wheezing Cardiovascular: negative for chest pain, fatigue and palpitations Gastrointestinal: negative for abdominal pain and change in bowel habits Genitourinary:negative Integument/breast: negative for nipple discharge Musculoskeletal:negative for myalgias Neurological: negative for gait problems and tremors Behavioral/Psych: negative for abusive relationship, depression Endocrine: negative for temperature intolerance      Blood pressure 111/74, pulse 68, weight 109 lb (49.4 kg), not currently breastfeeding.  Physical Exam Physical Exam General:   alert  Skin:   no rash or abnormalities  Lungs:   clear to auscultation bilaterally  Heart:   regular rate and rhythm, S1, S2 normal, no murmur, click, rub or gallop  Breasts:   normal without suspicious masses, skin or nipple changes or axillary nodes  Abdomen:  normal findings: no organomegaly, soft, non-tender and no hernia  Pelvis:  External genitalia: normal general appearance Urinary system: urethral meatus normal and bladder without fullness, nontender Vaginal: normal without tenderness, induration or masses Cervix: normal appearance Adnexa: normal bimanual exam Uterus: anteverted and non-tender, normal size    50% of 15 min visit spent on counseling and coordination of care.   Data Reviewed Labs  Assessment     1. Vaginal discharge Rx: - Cervicovaginal ancillary only( [place]) - tinidazole (TINDAMAX) 500 MG tablet; Take 2 tablets (1,000 mg total) by mouth daily with breakfast.  Dispense: 10 tablet; Refill: 2  2. Vaginal odor Rx: -  Cervicovaginal ancillary only( [place])  3. Dysmenorrhea Rx: - ibuprofen (ADVIL) 800 MG tablet; Take 1 tablet (800 mg total) by mouth every 8 (eight) hours as needed.  Dispense: 30 tablet; Refill: 5    Plan    Follow up in 3 months for Annual / Pap  No orders of the defined types were placed in this encounter.  Meds ordered this encounter  Medications  .  tinidazole (TINDAMAX) 500 MG tablet    Sig: Take 2 tablets (1,000 mg total) by mouth daily with breakfast.    Dispense:  10 tablet    Refill:  2  . ibuprofen (ADVIL) 800 MG tablet    Sig: Take 1 tablet (800 mg total) by mouth every 8 (eight) hours as needed.    Dispense:  30 tablet    Refill:  5     Shelly Bombard MD 06-09-2019

## 2019-06-12 LAB — CERVICOVAGINAL ANCILLARY ONLY
Bacterial vaginitis: POSITIVE — AB
Candida vaginitis: NEGATIVE
Chlamydia: NEGATIVE
Neisseria Gonorrhea: NEGATIVE
Trichomonas: NEGATIVE

## 2019-06-13 ENCOUNTER — Other Ambulatory Visit: Payer: Self-pay | Admitting: Obstetrics

## 2019-06-29 ENCOUNTER — Encounter: Payer: Self-pay | Admitting: Neurology

## 2019-06-29 ENCOUNTER — Ambulatory Visit (INDEPENDENT_AMBULATORY_CARE_PROVIDER_SITE_OTHER): Payer: Medicaid Other | Admitting: Neurology

## 2019-06-29 ENCOUNTER — Other Ambulatory Visit: Payer: Self-pay

## 2019-06-29 DIAGNOSIS — M542 Cervicalgia: Secondary | ICD-10-CM

## 2019-06-29 DIAGNOSIS — R29898 Other symptoms and signs involving the musculoskeletal system: Secondary | ICD-10-CM | POA: Diagnosis not present

## 2019-06-29 DIAGNOSIS — M62521 Muscle wasting and atrophy, not elsewhere classified, right upper arm: Secondary | ICD-10-CM
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
## 2019-06-29 NOTE — Patient Instructions (Signed)
MRI cervical spine After delivery, MRI of the chest wit contrast looking at the brachial plexus

## 2019-07-03 ENCOUNTER — Telehealth: Payer: Self-pay | Admitting: Neurology

## 2019-07-03 NOTE — Telephone Encounter (Signed)
Medicaid order sent to GI. They will obtain the auth and reach out to the patient to schedule.  

## 2019-07-12 ENCOUNTER — Ambulatory Visit
Admission: RE | Admit: 2019-07-12 | Discharge: 2019-07-12 | Disposition: A | Discharge status: Home / Self Care | Payer: Medicaid Other | Source: Ambulatory Visit | Attending: Neurology | Admitting: Neurology

## 2019-07-12 DIAGNOSIS — M542 Cervicalgia: Secondary | ICD-10-CM

## 2019-07-12 DIAGNOSIS — R29898 Other symptoms and signs involving the musculoskeletal system: Secondary | ICD-10-CM

## 2019-07-12 DIAGNOSIS — M62521 Muscle wasting and atrophy, not elsewhere classified, right upper arm: Secondary | ICD-10-CM

## 2019-07-13 ENCOUNTER — Other Ambulatory Visit (HOSPITAL_COMMUNITY)
Admission: RE | Admit: 2019-07-13 | Discharge: 2019-07-13 | Disposition: A | Discharge status: Home / Self Care | Payer: Medicaid Other | Source: Ambulatory Visit | Attending: Advanced Practice Midwife | Admitting: Advanced Practice Midwife

## 2019-07-13 ENCOUNTER — Inpatient Hospital Stay (HOSPITAL_COMMUNITY): Payer: Medicaid Other

## 2019-07-13 ENCOUNTER — Ambulatory Visit (INDEPENDENT_AMBULATORY_CARE_PROVIDER_SITE_OTHER): Payer: Medicaid Other | Admitting: Advanced Practice Midwife

## 2019-07-13 ENCOUNTER — Encounter: Payer: Self-pay | Admitting: Advanced Practice Midwife

## 2019-07-13 ENCOUNTER — Other Ambulatory Visit: Payer: Self-pay

## 2019-07-13 ENCOUNTER — Inpatient Hospital Stay (HOSPITAL_COMMUNITY)
Admission: AD | Admit: 2019-07-13 | Discharge: 2019-07-13 | Disposition: A | Discharge status: Home / Self Care | Payer: Medicaid Other | Source: Ambulatory Visit | Attending: Obstetrics & Gynecology | Admitting: Obstetrics & Gynecology

## 2019-07-13 VITALS — BP 103/73 | HR 101 | Temp 98.3°F | Wt 111.1 lb

## 2019-07-13 DIAGNOSIS — Z3A11 11 weeks gestation of pregnancy: Secondary | ICD-10-CM

## 2019-07-13 DIAGNOSIS — N83201 Unspecified ovarian cyst, right side: Secondary | ICD-10-CM | POA: Diagnosis not present

## 2019-07-13 DIAGNOSIS — N83291 Other ovarian cyst, right side: Secondary | ICD-10-CM

## 2019-07-13 DIAGNOSIS — Z87891 Personal history of nicotine dependence: Secondary | ICD-10-CM | POA: Insufficient documentation

## 2019-07-13 DIAGNOSIS — Z3481 Encounter for supervision of other normal pregnancy, first trimester: Secondary | ICD-10-CM | POA: Diagnosis not present

## 2019-07-13 DIAGNOSIS — O209 Hemorrhage in early pregnancy, unspecified: Secondary | ICD-10-CM

## 2019-07-13 DIAGNOSIS — Z3480 Encounter for supervision of other normal pregnancy, unspecified trimester: Secondary | ICD-10-CM | POA: Insufficient documentation

## 2019-07-13 DIAGNOSIS — O3481 Maternal care for other abnormalities of pelvic organs, first trimester: Secondary | ICD-10-CM | POA: Diagnosis not present

## 2019-07-13 DIAGNOSIS — R1031 Right lower quadrant pain: Secondary | ICD-10-CM | POA: Diagnosis present

## 2019-07-13 DIAGNOSIS — N83209 Unspecified ovarian cyst, unspecified side: Secondary | ICD-10-CM

## 2019-07-13 DIAGNOSIS — O26899 Other specified pregnancy related conditions, unspecified trimester: Secondary | ICD-10-CM

## 2019-07-13 DIAGNOSIS — R109 Unspecified abdominal pain: Secondary | ICD-10-CM

## 2019-07-13 DIAGNOSIS — O26891 Other specified pregnancy related conditions, first trimester: Secondary | ICD-10-CM | POA: Diagnosis not present

## 2019-07-13 DIAGNOSIS — Z3A10 10 weeks gestation of pregnancy: Secondary | ICD-10-CM | POA: Diagnosis not present

## 2019-07-13 HISTORY — DX: Encounter for supervision of other normal pregnancy, unspecified trimester: Z34.80

## 2019-07-13 MED ORDER — CONCEPT DHA 53.5-38-1 MG PO CAPS: 1.0000 | ORAL_CAPSULE | Freq: Every day | ORAL | 12 refills | Status: DC

## 2019-07-13 MED ORDER — BLOOD PRESSURE MONITOR KIT: 1.0000 | PACK | 0 refills | Status: DC

## 2019-07-13 NOTE — Discharge Instructions (Signed)
Ovarian Cyst An ovarian cyst is a fluid-filled sac on an ovary. The ovaries are organs that make eggs in women. Most ovarian cysts go away on their own and are not cancerous (are benign). Some cysts need treatment. Follow these instructions at home:  Take over-the-counter and prescription medicines only as told by your doctor.  Do not drive or use heavy machinery while taking prescription pain medicine.  Get pelvic exams and Pap tests as often as told by your doctor.  Return to your normal activities as told by your doctor. Ask your doctor what activities are safe for you.  Do not use any products that contain nicotine or tobacco, such as cigarettes and e-cigarettes. If you need help quitting, ask your doctor.  Keep all follow-up visits as told by your doctor. This is important. Contact a doctor if:  Your periods are: ? Late. ? Irregular. ? Painful.   Your periods stop.  You have pelvic pain that does not go away.  You have pressure on your bladder.  You have trouble making your bladder empty when you pee (urinate).  You have pain during sex.  You have any of the following in your belly (abdomen): ? A feeling of fullness. ? Pressure. ? Discomfort. ? Pain that does not go away. ? Swelling.  You feel sick most of the time.  You have trouble pooping (have constipation).  You are not as hungry as usual (you lose your appetite).  You get very bad acne.  You start to have more hair on your body and face.  You are gaining weight or losing weight without changing your exercise and eating habits.  You think you may be pregnant. Get help right away if:  You have belly pain that is very bad or gets worse.  You cannot eat or drink without throwing up (vomiting).  You suddenly get a fever.  Your period is a lot heavier than usual. This information is not intended to replace advice given to you by your health care provider. Make sure you discuss any questions you have  with your health care provider. Document Released: 05/04/2008 Document Revised: 10/29/2017 Document Reviewed: 04/19/2016 Elsevier Patient Education  2020 [employer]  

## 2019-07-13 NOTE — MAU Note (Signed)
Doesn't know why she is here. 5 days ago, she had some bleeding, saw it when she wiped after peeing.  Pain in her abd and back woke her.  The next day, she wasn't bleeding.  Saw some brown blood the next day when she used the bathroom.  Went to the dr today, they heard heart tones.  Had some pain when they did internal, and some something when they swabbed her, so sent her in for further eval.

## 2019-07-13 NOTE — MAU Provider Note (Signed)
History     CSN: [id number]  Arrival date and time: 07/13/19 1615   First Provider Initiated Contact with Patient 07/13/19 1736      Chief Complaint  Patient presents with  . Back Pain   HPI [name] is a 23 y.o. G2P1001 at 17w4dwho presents to MAU today with complaint of RLQ abdominal pain. The patient was seen for her initial prenatal visit today at [facility] and was noted to have moderate abdominal tenderness to palpation. She denied vaginal bleeding at the time, but did not scant spotting after her pelvic exam in the office. She denies fever.   OB History    Gravida  2   Para  1   Term  1   Preterm      AB      Living  1     SAB      TAB      Ectopic      Multiple  0   Live Births  1           Past Medical History:  Diagnosis Date  . Anemia   . Anxiety   . Depression   . Mental disorder     Past Surgical History:  Procedure Laterality Date  . WISDOM TOOTH EXTRACTION      Family History  Problem Relation Age of Onset  . Drug abuse Maternal Grandfather   . Diabetes Maternal Grandfather   . Hypertension Maternal Grandfather   . Vision loss Maternal Grandfather   . Cancer Maternal Grandmother   . COPD Maternal Grandmother     Social History   Tobacco Use  . Smoking status: Former Smoker    Packs/day: 0.50    Types: Cigarettes    Quit date: 02/11/2018    Years since quitting: 1.4  . Smokeless tobacco: Never Used  . Tobacco comment: stopped smoking after preg confirm  Substance Use Topics  . Alcohol use: No    Alcohol/week: 0.0 standard drinks  . Drug use: No    Allergies: No Known Allergies  Medications Prior to Admission  Medication Sig Dispense Refill Last Dose  . ondansetron (ZOFRAN) 4 MG tablet Take 4 mg by mouth 3 (three) times daily as needed.   07/12/2019 at Unknown time  . Prenat-FeFum-FePo-FA-Omega 3 (CONCEPT DHA) 53.5-38-1 MG CAPS Take 1 tablet by mouth daily. 30 capsule 12 Past Week at Unknown time  .  acetaminophen (TYLENOL) 325 MG tablet Take 2 tablets (650 mg total) by mouth every 4 (four) hours as needed (for pain scale < 4). 30 tablet 3 07/09/2019  . Blood Pressure Monitor KIT 1 kit by Does not apply route once a week. Check BP weekly. Large Cuff DX: Z34.6 1 kit 0   . ibuprofen (ADVIL) 800 MG tablet Take 1 tablet (800 mg total) by mouth every 8 (eight) hours as needed. (Patient not taking: Reported on 06/29/2019) 30 tablet 5   . Levonorgest-Eth Estrad-Fe Bisg (BALCOLTRA) 0.1-20 MG-MCG(21) TABS Take 1 tablet by mouth daily. (Patient not taking: Reported on 02/01/2019) 28 tablet 11   . Prenat-FeCbn-FeAspGl-FA-Omega (OB COMPLETE PETITE) 35-5-1-200 MG CAPS Take 1 tablet by mouth daily. (Patient not taking: Reported on 06/29/2019) 30 capsule 12   . tinidazole (TINDAMAX) 500 MG tablet Take 2 tablets (1,000 mg total) by mouth daily with breakfast. (Patient not taking: Reported on 06/29/2019) 10 tablet 2   . Vitamin D, Ergocalciferol, (DRISDOL) 1.25 MG (50000 UT) CAPS capsule Take 50,000 Units by mouth once  a week.       Review of Systems  Constitutional: Negative for fever.  Gastrointestinal: Positive for abdominal pain.  Genitourinary: Positive for vaginal bleeding. Negative for vaginal discharge.   Physical Exam   Blood pressure 114/67, pulse (!) 103, temperature 98.8 F (37.1 C), temperature source Oral, resp. rate 16, weight 50.8 kg, last menstrual period 04/30/2019, SpO2 99 %, not currently breastfeeding.  Physical Exam  Nursing note and vitals reviewed. Constitutional: She is oriented to person, place, and time. She appears well-developed and well-nourished. No distress.  HENT:  Head: Normocephalic and atraumatic.  Cardiovascular: Normal rate.  Respiratory: Effort normal.  GI: Soft. She exhibits no distension and no mass. There is abdominal tenderness (mild to moderate RLQ tenderness to palpation). There is no rebound and no guarding.  Neurological: She is alert and oriented to person,  place, and time.  Skin: Skin is warm and dry. No erythema.  Psychiatric: She has a normal mood and affect.    US Ob Comp Less 14 Wks  Result Date: 07/13/2019 CLINICAL DATA:  Vaginal bleeding EXAM: OBSTETRIC <14 WK ULTRASOUND TECHNIQUE: Transabdominal ultrasound was performed for evaluation of the gestation as well as the maternal uterus and adnexal regions. COMPARISON:  Obstetrical ultrasounds from prior gestations, most recently 09/13/2018 FINDINGS: Gestational age by LMP 10 weeks 4 days Intrauterine gestational sac: Single Yolk sac:  Visualized. Embryo:  Visualized. Cardiac Activity: Visualized. Heart Rate: 177 bpm CRL: 38.3 mm   10 w 5 d                  [place] EDC: 02/03/2020 Subchorionic hemorrhage:  None visualized. Maternal uterus/adnexae: Thick-walled, centrally hypoechoic structure in right adnexa with minimal peripheral color Doppler flow. Measures approximately 2.1 x 2.4 x 2.5 cm. Some posterior acoustic shadowing is noted. Normal right ovarian parenchyma is not well visualized. Remaining maternal structures are otherwise unremarkable. No additional free fluid. IMPRESSION: Single intrauterine gestation with estimated gestational age of [redacted] weeks 5 days concordant with gestational age by LMP. Normal cardiac activity is visualized. Suboptimal visualization of the right adnexa with a rounded, echogenic structure and trace free fluid may reflect involuting corpus luteum, less likely dermoid given posterior shadowing. Maternal structures otherwise unremarkable. Electronically Signed   By: Lovena Le M.D.   On: 07/13/2019 17:30     MAU Course  Procedures None  MDM [place] today  Discussed [place] results with Dr. Rip Harbour. Recommends Tylenol or occasional ibuprofen for pain.   Assessment and Plan  A: SIUP at 35w5dRight ovarian cyst   P: Discharge home Advised of OTC pain management options  First trimester precautions discussed Patient advised to follow-up with [facility] as scheduled for routine  prenatal care or sooner PRN Patient may return to MAU as needed or if her condition were to change or worsen  JKerry Hough PA-C 07/13/2019, 5:36 PM

## 2019-07-13 NOTE — Progress Notes (Addendum)
NOB.  C/o NV, no appetite, spotting and back pain.  GAD-7=5

## 2019-07-13 NOTE — Progress Notes (Signed)
 INITIAL PRENATAL VISIT  Subjective:   Catherine Collins is being seen today for her first obstetrical visit.  This is not a planned pregnancy. This is a desired pregnancy.  She is at [redacted]w[redacted]d gestation by irreg LMP. Her obstetrical history is significant for close pregnancy spacing. .  Patient does intend to breast feed. Pregnancy history fully reviewed.  Patient reports vaginal bleeding and low abd pain  a few days ago. No bleeding today.  Review of Systems:   Review of Systems  Objective:    Obstetric History OB History  Gravida Para Term Preterm AB Living  2 1 1     1  SAB TAB Ectopic Multiple Live Births        0 1    # Outcome Date GA Lbr Len/2nd Weight Sex Delivery Anes PTL Lv  2 Current           1 Term 09/14/18 [redacted]w[redacted]d 01:30 / 01:23 6 lb 3.1 oz (2.81 kg) F Vag-Spont EPI  LIV    Past Medical History:  Diagnosis Date  . Anemia   . Anxiety   . Depression   . Mental disorder     Past Surgical History:  Procedure Laterality Date  . WISDOM TOOTH EXTRACTION      Current Outpatient Medications on File Prior to Visit  Medication Sig Dispense Refill  . acetaminophen (TYLENOL) 325 MG tablet Take 2 tablets (650 mg total) by mouth every 4 (four) hours as needed (for pain scale < 4). 30 tablet 3  . ibuprofen (ADVIL) 800 MG tablet Take 1 tablet (800 mg total) by mouth every 8 (eight) hours as needed. (Patient not taking: Reported on 06/29/2019) 30 tablet 5  . Levonorgest-Eth Estrad-Fe Bisg (BALCOLTRA) 0.1-20 MG-MCG(21) TABS Take 1 tablet by mouth daily. (Patient not taking: Reported on 02/01/2019) 28 tablet 11  . ondansetron (ZOFRAN) 4 MG tablet Take 4 mg by mouth 3 (three) times daily as needed.    . Prenat-FeCbn-FeAspGl-FA-Omega (OB COMPLETE PETITE) 35-5-1-200 MG CAPS Take 1 tablet by mouth daily. (Patient not taking: Reported on 06/29/2019) 30 capsule 12  . tinidazole (TINDAMAX) 500 MG tablet Take 2 tablets (1,000 mg total) by mouth daily with breakfast. (Patient not taking:  Reported on 06/29/2019) 10 tablet 2  . Vitamin D, Ergocalciferol, (DRISDOL) 1.25 MG (50000 UT) CAPS capsule Take 50,000 Units by mouth once a week.     No current facility-administered medications on file prior to visit.     No Known Allergies  Social History:  reports that she quit smoking about 17 months ago. Her smoking use included cigarettes. She smoked 0.50 packs per day. She has never used smokeless tobacco. She reports that she does not drink alcohol or use drugs.  Family History  Problem Relation Age of Onset  . Drug abuse Maternal Grandfather   . Diabetes Maternal Grandfather   . Hypertension Maternal Grandfather   . Vision loss Maternal Grandfather   . Cancer Maternal Grandmother   . COPD Maternal Grandmother     The following portions of the patient's history were reviewed and updated as appropriate: allergies, current medications, past family history, past medical history, past social history, past surgical history and problem list.  Review of Systems Review of Systems  Constitutional: Negative for appetite change, chills and fever.  Gastrointestinal: Positive for abdominal pain. Negative for constipation, diarrhea, nausea and vomiting.  Genitourinary: Positive for pelvic pain and vaginal bleeding. Negative for dysuria, flank pain, frequency, hematuria, vaginal discharge and vaginal   pain.  Musculoskeletal: Negative for back pain.      Physical Exam:  BP 103/73   Pulse (!) 101   Temp 98.3 F (36.8 C)   Wt 111 lb 1.6 oz (50.4 kg)   LMP 04/30/2019   BMI 20.32 kg/m  CONSTITUTIONAL: Well-developed, well-nourished female in no acute distress.  HENT:  Normocephalic, atraumatic,Oropharynx is clear and moist. Piercing present. EYES: Conjunctivae normal. No scleral icterus.  NECK: Normal range of motion, supple, no masses.   SKIN: Skin is warm and dry. No rash noted. Not diaphoretic. No erythema. No pallor. MUSCULOSKELETAL: Normal range of motion. No tenderness.  No  cyanosis, clubbing, or edema.   NEUROLOGIC: Alert and oriented to person, place, and time. Normal muscle tone coordination.  PSYCHIATRIC: Normal mood and affect. Normal behavior. Normal judgment and thought content. CARDIOVASCULAR: Normal heart rate noted, regular rhythm RESPIRATORY: Clear to auscultation bilaterally. Effort and breath sounds normal, no problems with respiration noted. BREASTS: Declined ABDOMEN: Soft, normal bowel sounds, no distention noted.  No tenderness, rebound or guarding. Fundal ht: 12 cm PELVIC: Normal appearing external genitalia; normal appearing vaginal mucosa and cervix.  Mod abnormal discharge noted.  Pap smear not obtained 2/2 age <21. Cervix slightly friable, ectropion noted.  Uterus ~12 week size, no other palpable masses, pos right adnexal tenderness, mild CMT. FHR: 172 by doppler   Assessment:    Pregnancy: G1P0000 1. Supervision of other normal pregnancy, antepartum  - Enroll Patient in Babyscripts - Babyscripts Schedule Optimization - Cervicovaginal ancillary only( [facility]) - Obstetric Panel, Including HIV - Culture, OB Urine - Genetic Screening - Blood Pressure Monitor KIT; 1 kit by Does not apply route once a week. Check BP weekly. Large Cuff DX: Z34.6  Dispense: 1 kit; Refill: 0 - Prenat-FeFum-FePo-FA-Omega 3 (CONCEPT DHA) 53.5-38-1 MG CAPS; Take 1 tablet by mouth daily.  Dispense: 30 capsule; Refill: 12  2. Vaginal bleeding in pregnancy, first trimester - To MAU for US  3. Abdominal pain during pregnancy, first trimester - To MAU for US  4. Irreg cycles  - Verify dating by US.      Plan:     Initial labs drawn. Prenatal vitamins. Problem list reviewed and updated. Genetic screening discussed: NIPS/AFP requested. Role of ultrasound in pregnancy discussed; Anatomy US: requested. Amniocentesis discussed: not indicated. Follow up in 4 weeks. Discussed clinic routines, schedule of care and testing, genetic screening options,  involvement of students and residents under the direct supervision of APPs and doctors and presence of female providers. Pt verbalized understanding.   , , CNM 07/13/2019 2:55 PM   

## 2019-07-13 NOTE — Patient Instructions (Signed)
Vaginal Bleeding During Pregnancy, First Trimester ° °A small amount of bleeding from the vagina (spotting) is relatively common during early pregnancy. It usually stops on its own. Various things may cause bleeding or spotting during early pregnancy. Some bleeding may be related to the pregnancy, and some may not. In many cases, the bleeding is normal and is not a problem. However, bleeding can also be a sign of something serious. Be sure to tell your health care provider about any vaginal bleeding right away. °Some possible causes of vaginal bleeding during the first trimester include: °· Infection or inflammation of the cervix. °· Growths (polyps) on the cervix. °· Miscarriage or threatened miscarriage. °· Pregnancy tissue developing outside of the uterus (ectopic pregnancy). °· A mass of tissue developing in the uterus due to an egg being fertilized incorrectly (molar pregnancy). °Follow these instructions at home: °Activity °· Follow instructions from your health care provider about limiting your activity. Ask what activities are safe for you. °· If needed, make plans for someone to help with your regular activities. °· Do not have sex or orgasms until your health care provider says that this is safe. °General instructions °· Take over-the-counter and prescription medicines only as told by your health care provider. °· Pay attention to any changes in your symptoms. °· Do not use tampons or douche. °· Write down how many pads you use each day, how often you change pads, and how soaked (saturated) they are. °· If you pass any tissue from your vagina, save the tissue so you can show it to your health care provider. °· Keep all follow-up visits as told by your health care provider. This is important. °Contact a health care provider if: °· You have vaginal bleeding during any part of your pregnancy. °· You have cramps or labor pains. °· You have a fever. °Get help right away if: °· You have severe cramps in your  back or abdomen. °· You pass large clots or a large amount of tissue from your vagina. °· Your bleeding increases. °· You feel light-headed or weak, or you faint. °· You have chills. °· You are leaking fluid or have a gush of fluid from your vagina. °Summary °· A small amount of bleeding (spotting) from the vagina is relatively common during early pregnancy. °· Various things may cause bleeding or spotting in early pregnancy. °· Be sure to tell your health care provider about any vaginal bleeding right away. °This information is not intended to replace advice given to you by your health care provider. Make sure you discuss any questions you have with your health care provider. °Document Released: 08/26/2005 Document Revised: 03/07/2019 Document Reviewed: 02/18/2017 °Elsevier Patient Education © 2020 Elsevier Inc. ° °

## 2019-07-14 LAB — OBSTETRIC PANEL, INCLUDING HIV
Antibody Screen: NEGATIVE
Basophils Absolute: 0 10*3/uL (ref 0.0–0.2)
Basos: 0 %
EOS (ABSOLUTE): 0.1 10*3/uL (ref 0.0–0.4)
Eos: 1 %
HIV Screen 4th Generation wRfx: NONREACTIVE
Hematocrit: 40.2 % (ref 34.0–46.6)
Hemoglobin: 13.7 g/dL (ref 11.1–15.9)
Hepatitis B Surface Ag: NEGATIVE
Immature Grans (Abs): 0 10*3/uL (ref 0.0–0.1)
Immature Granulocytes: 0 %
Lymphocytes Absolute: 2 10*3/uL (ref 0.7–3.1)
Lymphs: 19 %
MCH: 31.6 pg (ref 26.6–33.0)
MCHC: 34.1 g/dL (ref 31.5–35.7)
MCV: 93 fL (ref 79–97)
Monocytes Absolute: 0.8 10*3/uL (ref 0.1–0.9)
Monocytes: 8 %
Neutrophils Absolute: 7.6 10*3/uL — ABNORMAL HIGH (ref 1.4–7.0)
Neutrophils: 72 %
Platelets: 275 10*3/uL (ref 150–450)
RBC: 4.34 x10E6/uL (ref 3.77–5.28)
RDW: 12.4 % (ref 11.7–15.4)
RPR Ser Ql: NONREACTIVE
Rh Factor: POSITIVE
Rubella Antibodies, IGG: 1.44 index (ref >0.99)
WBC: 10.5 10*3/uL (ref 3.4–10.8)

## 2019-07-15 LAB — CERVICOVAGINAL ANCILLARY ONLY
Bacterial vaginitis: NEGATIVE
Candida vaginitis: NEGATIVE
Chlamydia: NEGATIVE
Neisseria Gonorrhea: NEGATIVE
Trichomonas: NEGATIVE

## 2019-07-16 ENCOUNTER — Encounter: Payer: Self-pay | Admitting: Advanced Practice Midwife

## 2019-07-16 LAB — URINE CULTURE, OB REFLEX

## 2019-07-16 LAB — CULTURE, OB URINE

## 2019-07-20 ENCOUNTER — Encounter: Payer: Self-pay | Admitting: Family Medicine

## 2019-07-24 ENCOUNTER — Encounter: Payer: Self-pay | Admitting: Advanced Practice Midwife

## 2019-08-10 ENCOUNTER — Telehealth (INDEPENDENT_AMBULATORY_CARE_PROVIDER_SITE_OTHER): Payer: Medicaid Other | Admitting: Obstetrics

## 2019-08-10 ENCOUNTER — Encounter: Payer: Self-pay | Admitting: Obstetrics

## 2019-08-10 DIAGNOSIS — O09892 Supervision of other high risk pregnancies, second trimester: Secondary | ICD-10-CM

## 2019-08-10 DIAGNOSIS — O0990 Supervision of high risk pregnancy, unspecified, unspecified trimester: Secondary | ICD-10-CM

## 2019-08-10 DIAGNOSIS — O09899 Supervision of other high risk pregnancies, unspecified trimester: Secondary | ICD-10-CM

## 2019-08-10 DIAGNOSIS — A599 Trichomoniasis, unspecified: Secondary | ICD-10-CM

## 2019-08-10 DIAGNOSIS — O0992 Supervision of high risk pregnancy, unspecified, second trimester: Secondary | ICD-10-CM

## 2019-08-10 DIAGNOSIS — Z3A14 14 weeks gestation of pregnancy: Secondary | ICD-10-CM

## 2019-08-10 DIAGNOSIS — O09299 Supervision of pregnancy with other poor reproductive or obstetric history, unspecified trimester: Secondary | ICD-10-CM

## 2019-08-10 DIAGNOSIS — Z8619 Personal history of other infectious and parasitic diseases: Secondary | ICD-10-CM

## 2019-08-10 DIAGNOSIS — O09292 Supervision of pregnancy with other poor reproductive or obstetric history, second trimester: Secondary | ICD-10-CM

## 2019-08-10 NOTE — Progress Notes (Signed)
Virtual ROB  CC: pain and pressure.  Pt states she has a cyst.

## 2019-08-10 NOTE — Progress Notes (Signed)
[place] VIRTUAL VIDEO VISIT ENCOUNTER NOTE  Provider location: [facility] [place]   I connected with Catherine Collins on 08/10/19 at  2:15 PM EDT by [facility] MyChart Video Encounter at home and verified that I am speaking with the correct person using two identifiers.   I discussed the limitations, risks, security and privacy concerns of performing an evaluation and management service virtually and the availability of in person appointments. I also discussed with the patient that there may be a patient responsible charge related to this service. The patient expressed understanding and agreed to proceed. Subjective:  Catherine Collins is a 23 y.o. G2P1001 at [redacted]w[redacted]d being seen today for ongoing prenatal care.  She is currently monitored for the following issues for this high-risk pregnancy and has History of depression; Late prenatal care; Chlamydia infection affecting pregnancy in second trimester; Gonorrhea affecting pregnancy in second trimester; History of prior pregnancy with IUGR newborn; History of tobacco use; and Supervision of other normal pregnancy, antepartum on their problem list.  Patient reports heartburn.  Contractions: Not present. Vag. Bleeding: None.  Movement: Absent. Denies any leaking of fluid.   The following portions of the patient's history were reviewed and updated as appropriate: allergies, current medications, past family history, past medical history, past social history, past surgical history and problem list.   Objective:  There were no vitals filed for this visit.  Fetal Status:     Movement: Absent     General:  Alert, oriented and cooperative. Patient is in no acute distress.  Respiratory: Normal respiratory effort, no problems with respiration noted  Mental Status: Normal mood and affect. Normal behavior. Normal judgment and thought content.  Rest of physical exam deferred due to type of encounter  Imaging: Mr Cervical  Spine Wo Contrast  Result Date: 07/14/2019  [facility] [address], [place], [place] [address] [phone] NEUROIMAGING REPORT STUDY DATE: 07/12/2019 PATIENT NAME: BONETTA MOSTEK DOB: 20-Dec-1995 MRN: [id number] ORDERING CLINICIAN: Dr Jaynee Eagles CLINICAL HISTORY:  22 year patient with neck pain COMPARISON FILMS:none EXAM: MRI Cervical Spine wo TECHNIQUE: MRI of the cervical spine was obtained utilizing 3 mm sagittal slices from the posterior fossa down to the T3-4 level with T1, T2 and inversion recovery views. In addition 4 mm axial slices from I4-5 down to T1-2 level were included with T2 and gradient echo views. CONTRAST: none IMAGING SITE: [facility] FINDINGS: The cervical vertebrae show loss of forward lordotic curvature but normal body height, marrow and disc signal charecteristics.  There is no frank disc herniation, cord compression or significant root of foraminal encroachment.  The visualized paraspinal soft tissue appear unremarkable.  Visualized portion of the upper thoracic spine and lower brainstem and craniovertebral junction appear unremarkable as well.   Unremarkable MRI scan of the cervical spine without contrast except loss of forward lordotic curvature. INTERPRETING PHYSICIAN: Antony Contras, MD Certified in  Neuroimaging by [place] [employer] Neuro[employer] and [employer]ities  US Ob Comp Less 14 Wks  Result Date: 07/13/2019 CLINICAL DATA:  Vaginal bleeding EXAM: OBSTETRIC <14 WK ULTRASOUND TECHNIQUE: Transabdominal ultrasound was performed for evaluation of the gestation as well as the maternal uterus and adnexal regions. COMPARISON:  Obstetrical ultrasounds from prior gestations, most recently 09/13/2018 FINDINGS: Gestational age by LMP 10 weeks 4 days Intrauterine gestational sac: Single Yolk sac:  Visualized. Embryo:  Visualized. Cardiac Activity: Visualized. Heart Rate: 177 bpm CRL: 38.3 mm   10 w  5 d                  US  EDC: 02/03/2020 Subchorionic hemorrhage:  None visualized. Maternal uterus/adnexae: Thick-walled, centrally hypoechoic structure in right adnexa with minimal peripheral color Doppler flow. Measures approximately 2.1 x 2.4 x 2.5 cm. Some posterior acoustic shadowing is noted. Normal right ovarian parenchyma is not well visualized. Remaining maternal structures are otherwise unremarkable. No additional free fluid. IMPRESSION: Single intrauterine gestation with estimated gestational age of [redacted] weeks 5 days concordant with gestational age by LMP. Normal cardiac activity is visualized. Suboptimal visualization of the right adnexa with a rounded, echogenic structure and trace free fluid may reflect involuting corpus luteum, less likely dermoid given posterior shadowing. Maternal structures otherwise unremarkable. Electronically Signed   By: Kreg ShropshirePrice  DeHay M.D.   On: 07/13/2019 17:30    Assessment and Plan:  Pregnancy: G2P1001 at 7033w4d 1. Supervision of high risk pregnancy, antepartum Rx: - US MFM OB COMP + 14 WK; Future - AFP, Serum, Open Spina Bifida; Future  2. IUGR (intrauterine growth restriction) in prior pregnancy, pregnant  3. History of gonorrhea  4. History of chlamydia  5. Trichomonas infection   Preterm labor symptoms and general obstetric precautions including but not limited to vaginal bleeding, contractions, leaking of fluid and fetal movement were reviewed in detail with the patient. I discussed the assessment and treatment plan with the patient. The patient was provided an opportunity to ask questions and all were answered. The patient agreed with the plan and demonstrated an understanding of the instructions. The patient was advised to call back or seek an in-person office evaluation/[facility] for any urgent or concerning symptoms. Please refer to After Visit Summary for other counseling recommendations.   I provided 10 minutes of face-to-face time during  this encounter.  No follow-ups on file.  Future Appointments  Date Time Provider Department Center  09/11/2019  1:00 PM WH-MFC NURSE WH-MFC MFC-US  09/11/2019  1:00 PM [facility]-M[facility] US 3 WH-MFCUS MFC-US  09/11/2019  2:10 PM WOC-WOCA LAB WOC-WOCA WOC  03/06/2020  2:00 PM Anson FretAhern, Antonia B, MD GNA-GNA None    Coral Ceoharles Johnnae Impastato, MD [facility]

## 2019-08-15 ENCOUNTER — Other Ambulatory Visit (HOSPITAL_COMMUNITY)
Admission: RE | Admit: 2019-08-15 | Discharge: 2019-08-15 | Disposition: A | Discharge status: Home / Self Care | Payer: Medicaid Other | Source: Ambulatory Visit | Attending: Obstetrics and Gynecology | Admitting: Obstetrics and Gynecology

## 2019-08-15 ENCOUNTER — Other Ambulatory Visit: Payer: Self-pay | Admitting: Obstetrics and Gynecology

## 2019-08-15 ENCOUNTER — Ambulatory Visit: Payer: Medicaid Other

## 2019-08-15 ENCOUNTER — Other Ambulatory Visit: Payer: Self-pay

## 2019-08-15 DIAGNOSIS — N898 Other specified noninflammatory disorders of vagina: Secondary | ICD-10-CM | POA: Diagnosis not present

## 2019-08-15 NOTE — Progress Notes (Signed)
Pt is in the office for self swab Pt reports vaginal discharge and odor. 

## 2019-08-15 NOTE — Progress Notes (Signed)
I have reviewed this chart and agree with the RN/CMA assessment and management.    K. Meryl Murielle Stang, M.D. Attending [facility] (Faculty Practice)   

## 2019-08-16 LAB — CERVICOVAGINAL ANCILLARY ONLY
Bacterial Vaginitis (gardnerella): POSITIVE — AB
Candida Glabrata: NEGATIVE
Candida Vaginitis: POSITIVE — AB
Molecular Disclaimer: NEGATIVE
Molecular Disclaimer: NEGATIVE
Molecular Disclaimer: NEGATIVE
Molecular Disclaimer: NORMAL
Trichomonas: NEGATIVE

## 2019-08-17 LAB — CERVICOVAGINAL ANCILLARY ONLY
Chlamydia: NEGATIVE
Neisseria Gonorrhea: NEGATIVE

## 2019-08-18 ENCOUNTER — Other Ambulatory Visit: Payer: Self-pay | Admitting: Obstetrics and Gynecology

## 2019-08-18 MED ORDER — METRONIDAZOLE 500 MG PO TABS: 500.0000 mg | ORAL_TABLET | Freq: Two times a day (BID) | ORAL | 0 refills | Status: DC

## 2019-08-18 MED ORDER — TERCONAZOLE 0.8 % VA CREA: 1.0000 | TOPICAL_CREAM | Freq: Every day | VAGINAL | 0 refills | Status: DC

## 2019-08-18 NOTE — Progress Notes (Signed)
Script for flagyl and terconazole sent to pharmacy.

## 2019-08-22 ENCOUNTER — Other Ambulatory Visit: Payer: Self-pay

## 2019-08-22 DIAGNOSIS — Z0000 Encounter for general adult medical examination without abnormal findings: Secondary | ICD-10-CM

## 2019-08-22 MED ORDER — OB COMPLETE PETITE 35-5-1-200 MG PO CAPS: 1.0000 | ORAL_CAPSULE | Freq: Every day | ORAL | 12 refills | Status: DC

## 2019-08-22 NOTE — Progress Notes (Signed)
Pt request to switch back to previous prenatal vitamin, rx sent to pharmacy.

## 2019-09-05 ENCOUNTER — Telehealth: Payer: Self-pay | Admitting: *Deleted

## 2019-09-05 ENCOUNTER — Inpatient Hospital Stay (HOSPITAL_COMMUNITY)
Admission: AD | Admit: 2019-09-05 | Discharge: 2019-09-05 | Disposition: A | Discharge status: Home / Self Care | Payer: Medicaid Other | Attending: Obstetrics and Gynecology | Admitting: Obstetrics and Gynecology

## 2019-09-05 ENCOUNTER — Other Ambulatory Visit: Payer: Self-pay

## 2019-09-05 ENCOUNTER — Inpatient Hospital Stay (HOSPITAL_BASED_OUTPATIENT_CLINIC_OR_DEPARTMENT_OTHER): Payer: Medicaid Other

## 2019-09-05 DIAGNOSIS — O26892 Other specified pregnancy related conditions, second trimester: Secondary | ICD-10-CM

## 2019-09-05 DIAGNOSIS — Z3A18 18 weeks gestation of pregnancy: Secondary | ICD-10-CM

## 2019-09-05 DIAGNOSIS — O99891 Other specified diseases and conditions complicating pregnancy: Secondary | ICD-10-CM

## 2019-09-05 DIAGNOSIS — R109 Unspecified abdominal pain: Secondary | ICD-10-CM | POA: Diagnosis not present

## 2019-09-05 DIAGNOSIS — R1030 Lower abdominal pain, unspecified: Secondary | ICD-10-CM | POA: Insufficient documentation

## 2019-09-05 DIAGNOSIS — Z87891 Personal history of nicotine dependence: Secondary | ICD-10-CM | POA: Insufficient documentation

## 2019-09-05 DIAGNOSIS — O368121 Decreased fetal movements, second trimester, fetus 1: Secondary | ICD-10-CM

## 2019-09-05 DIAGNOSIS — O368120 Decreased fetal movements, second trimester, not applicable or unspecified: Secondary | ICD-10-CM | POA: Diagnosis not present

## 2019-09-05 LAB — URINALYSIS, ROUTINE W REFLEX MICROSCOPIC
Bilirubin Urine: NEGATIVE
Glucose, UA: NEGATIVE mg/dL
Hgb urine dipstick: NEGATIVE
Ketones, ur: NEGATIVE mg/dL
Nitrite: NEGATIVE
Protein, ur: NEGATIVE mg/dL
Specific Gravity, Urine: 1.024 (ref 1.005–1.030)
pH: 7 (ref 5.0–8.0)

## 2019-09-05 NOTE — MAU Provider Note (Signed)
History   [id number]   Chief Complaint  Patient presents with  . Decreased Fetal Movement    HPI Catherine Collins is a 23 y.o. female  G2P1001 here with report of decreased fetal movement since 10/2.  Reports feeling no movement since 10/2. Was feeling daily movement since 15 weeks. Also reports lower abdominal cramping since the weekend. Denies vaginal bleeding or leaking of fluid.  Denies n/v/d, constipation, fever, or dysuria.   Patient's last menstrual period was 04/30/2019.  OB History  Gravida Para Term Preterm AB Living  '2 1 1     1  ' SAB TAB Ectopic Multiple Live Births        0 1    # Outcome Date GA Lbr Len/2nd Weight Sex Delivery Anes PTL Lv  2 Current           1 Term 09/14/18 50w1d01:30 / 01:23 2810 g F Vag-Spont EPI  LIV    Past Medical History:  Diagnosis Date  . Anemia   . Anxiety   . Depression   . Mental disorder     Family History  Problem Relation Age of Onset  . Drug abuse Maternal Grandfather   . Diabetes Maternal Grandfather   . Hypertension Maternal Grandfather   . Vision loss Maternal Grandfather   . Cancer Maternal Grandmother   . COPD Maternal Grandmother     Social History   Socioeconomic History  . Marital status: Single    Spouse name: Not on file  . Number of children: 1  . Years of education: Not on file  . Highest education level: 10th grade  Occupational History  . Not on file  Social Needs  . Financial resource strain: Not hard at all  . Food insecurity    Worry: Never true    Inability: Never true  . Transportation needs    Medical: No    Non-medical: Not on file  Tobacco Use  . Smoking status: Former Smoker    Packs/day: 0.50    Types: Cigarettes    Quit date: 02/11/2018    Years since quitting: 1.5  . Smokeless tobacco: Never Used  . Tobacco comment: stopped smoking after preg confirm  Substance and Sexual Activity  . Alcohol use: No    Alcohol/week: 0.0 standard drinks  . Drug use: No  . Sexual activity:  Yes    Birth control/protection: None  Lifestyle  . Physical activity    Days per week: 1 day    Minutes per session: 30 min  . Stress: To some extent  Relationships  . Social connections    Talks on phone: More than three times a week    Gets together: More than three times a week    Attends religious service: Never    Active member of club or organization: No    Attends meetings of clubs or organizations: Not on file    Relationship status: Never married  Other Topics Concern  . Not on file  Social History Narrative   Lives with child    No Known Allergies  No current facility-administered medications on file prior to encounter.    Current Outpatient Medications on File Prior to Encounter  Medication Sig Dispense Refill  . Prenat-FeFum-FePo-FA-Omega 3 (CONCEPT DHA) 53.5-38-1 MG CAPS Take 1 tablet by mouth daily. 30 capsule 12  . acetaminophen (TYLENOL) 325 MG tablet Take 2 tablets (650 mg total) by mouth every 4 (four) hours as needed (for pain scale < 4). (Patient  not taking: Reported on 08/10/2019) 30 tablet 3  . Blood Pressure Monitor KIT 1 kit by Does not apply route once a week. Check BP weekly. Large Cuff DX: Z34.6 (Patient not taking: Reported on 08/15/2019) 1 kit 0  . ibuprofen (ADVIL) 800 MG tablet Take 1 tablet (800 mg total) by mouth every 8 (eight) hours as needed. (Patient not taking: Reported on 06/29/2019) 30 tablet 5  . metroNIDAZOLE (FLAGYL) 500 MG tablet Take 1 tablet (500 mg total) by mouth 2 (two) times daily. 14 tablet 0  . ondansetron (ZOFRAN) 4 MG tablet Take 4 mg by mouth 3 (three) times daily as needed.    . Prenat-FeCbn-FeAspGl-FA-Omega (OB COMPLETE PETITE) 35-5-1-200 MG CAPS Take 1 tablet by mouth daily. 30 capsule 12  . terconazole (TERAZOL 3) 0.8 % vaginal cream Place 1 applicator vaginally at bedtime. Apply nightly for three nights. 20 g 0  . Vitamin D, Ergocalciferol, (DRISDOL) 1.25 MG (50000 UT) CAPS capsule Take 50,000 Units by mouth once a week.        Review of Systems  Constitutional: Negative.   Gastrointestinal: Positive for abdominal pain. Negative for constipation, diarrhea, nausea and vomiting.  Genitourinary: Negative.      Physical Exam   Vitals:   09/05/19 1630 09/05/19 1632 09/05/19 1634 09/05/19 1659  BP:  (!) 115/58  107/64  Pulse:   89 74  Resp:  16    Temp:  98.5 F (36.9 C)    TempSrc:  Oral    Weight: 55.6 kg     Height: '5\' 2"'  (1.575 m)       Physical Exam  Nursing note and vitals reviewed. Constitutional: She appears well-developed and well-nourished. No distress.  GI: Soft. She exhibits no distension. There is no abdominal tenderness.  Genitourinary:    Genitourinary Comments: Dilation: Closed(externally 0.5cm, closed internally) Cervical Position: Posterior Exam by:: Robyne Askew, NP    Skin: Skin is warm and dry. She is not diaphoretic.  Psychiatric: She has a normal mood and affect. Her behavior is normal. Thought content normal.    MAU Course  Procedures Results for orders placed or performed during the hospital encounter of 09/05/19 (from the past 24 hour(s))  Urinalysis, Routine w reflex microscopic     Status: Abnormal   Collection Time: 09/05/19  5:00 PM  Result Value Ref Range   Color, Urine YELLOW YELLOW   APPearance CLOUDY (A) CLEAR   Specific Gravity, Urine 1.024 1.005 - 1.030   pH 7.0 5.0 - 8.0   Glucose, UA NEGATIVE NEGATIVE mg/dL   Hgb urine dipstick NEGATIVE NEGATIVE   Bilirubin Urine NEGATIVE NEGATIVE   Ketones, ur NEGATIVE NEGATIVE mg/dL   Protein, ur NEGATIVE NEGATIVE mg/dL   Nitrite NEGATIVE NEGATIVE   Leukocytes,Ua MODERATE (A) NEGATIVE   RBC / HPF 0-5 0 - 5 RBC/hpf   WBC, UA 6-10 0 - 5 WBC/hpf   Bacteria, UA FEW (A) NONE SEEN   Squamous Epithelial / LPF 6-10 0 - 5   Mucus PRESENT     MDM FHT present via doppler  Cervix ext 0.5 but closed & firm. TVUS shows CL of 3.1 cm U/a with some leuks. Pt without urinary complaints. Will send for urine culture.    Assessment and Plan  A: 1. Decreased fetal movements in second trimester, single or unspecified fetus   2. [redacted] weeks gestation of pregnancy   3. Abdominal pain during pregnancy in second trimester    P: Discharge home Discussed reasons to return to  MAU Urine culture pending Keep upcoming ob appt    Jorje Guild, NP 09/05/2019 5:36 PM

## 2019-09-05 NOTE — MAU Note (Signed)
Pt says she has not felt baby move since Oct 2. Denies bleeding or LOF. She said she'd previously felt baby move since 15 weeks.

## 2019-09-05 NOTE — Discharge Instructions (Signed)
Abdominal Pain During Pregnancy ° °Abdominal pain is common during pregnancy, and has many possible causes. Some causes are more serious than others, and sometimes the cause is not known. Abdominal pain can be a sign that labor is starting. It can also be caused by normal growth and stretching of muscles and ligaments during pregnancy. Always tell your health care provider if you have any abdominal pain. °Follow these instructions at home: °· Do not have sex or put anything in your vagina until your pain goes away completely. °· Get plenty of rest until your pain improves. °· Drink enough fluid to keep your urine pale yellow. °· Take over-the-counter and prescription medicines only as told by your health care provider. °· Keep all follow-up visits as told by your health care provider. This is important. °Contact a health care provider if: °· Your pain continues or gets worse after resting. °· You have lower abdominal pain that: °? Comes and goes at regular intervals. °? Spreads to your back. °? Is similar to menstrual cramps. °· You have pain or burning when you urinate. °Get help right away if: °· You have a fever or chills. °· You have vaginal bleeding. °· You are leaking fluid from your vagina. °· You are passing tissue from your vagina. °· You have vomiting or diarrhea that lasts for more than 24 hours. °· Your baby is moving less than usual. °· You feel very weak or faint. °· You have shortness of breath. °· You develop severe pain in your upper abdomen. °Summary °· Abdominal pain is common during pregnancy, and has many possible causes. °· If you experience abdominal pain during pregnancy, tell your health care provider right away. °· Follow your health care provider's home care instructions and keep all follow-up visits as directed. °This information is not intended to replace advice given to you by your health care provider. Make sure you discuss any questions you have with your health care  provider. °Document Released: 11/16/2005 Document Revised: 03/06/2019 Document Reviewed: 02/18/2017 °Elsevier Patient Education © 2020 [employer] ° °

## 2019-09-05 NOTE — Telephone Encounter (Signed)
Pt grandmother called to office stating pt has not felt her baby move in about 4 days.   Return call to pt.  Spoke with grandmother and states that she spoke with [facility] and was advised to be seen. Spoke with pt and she states she has not felt any movement in about 5 days.  Denies bleeding or any other problems today. Pt advised to be seen as soon as possible at hospital for evaluation.  Pt states understanding.

## 2019-09-07 ENCOUNTER — Telehealth (INDEPENDENT_AMBULATORY_CARE_PROVIDER_SITE_OTHER): Payer: Medicaid Other | Admitting: Obstetrics and Gynecology

## 2019-09-07 ENCOUNTER — Encounter: Payer: Self-pay | Admitting: Obstetrics and Gynecology

## 2019-09-07 DIAGNOSIS — Z3480 Encounter for supervision of other normal pregnancy, unspecified trimester: Secondary | ICD-10-CM

## 2019-09-07 DIAGNOSIS — Z8759 Personal history of other complications of pregnancy, childbirth and the puerperium: Secondary | ICD-10-CM

## 2019-09-07 DIAGNOSIS — Z3A18 18 weeks gestation of pregnancy: Secondary | ICD-10-CM

## 2019-09-07 DIAGNOSIS — Z3482 Encounter for supervision of other normal pregnancy, second trimester: Secondary | ICD-10-CM

## 2019-09-07 LAB — CULTURE, OB URINE: Culture: 60000 — AB

## 2019-09-07 NOTE — Progress Notes (Signed)
TELEHEALTH OBSTETRICS PRENATAL VIRTUAL VIDEO VISIT ENCOUNTER NOTE  Provider location: [facility] [place]   I connected with Catherine Collins on 09/07/19 at 11:00 AM EDT by MyChart Video Encounter at home and verified that I am speaking with the correct person using two identifiers.   I discussed the limitations, risks, security and privacy concerns of performing an evaluation and management service virtually and the availability of in person appointments. I also discussed with the patient that there may be a patient responsible charge related to this service. The patient expressed understanding and agreed to proceed. Subjective:  Catherine Collins is a 23 y.o. G2P1001 at [redacted]w[redacted]d being seen today for ongoing prenatal care.  She is currently monitored for the following issues for this high-risk pregnancy and has History of depression; Late prenatal care; Chlamydia infection affecting pregnancy in second trimester; Gonorrhea affecting pregnancy in second trimester; History of prior pregnancy with IUGR newborn; History of tobacco use; and Supervision of other normal pregnancy, antepartum on their problem list.  Patient reports no complaints.  Contractions: Not present. Vag. Bleeding: None.   . Denies any leaking of fluid.   The following portions of the patient's history were reviewed and updated as appropriate: allergies, current medications, past family history, past medical history, past social history, past surgical history and problem list.   Objective:  There were no vitals filed for this visit.  Fetal Status:           General:  Alert, oriented and cooperative. Patient is in no acute distress.  Respiratory: Normal respiratory effort, no problems with respiration noted  Mental Status: Normal mood and affect. Normal behavior. Normal judgment and thought content.  Rest of physical exam deferred due to type of encounter  Imaging: [place] Mfm Ob Transvaginal  Result Date:  09/06/2019 ----------------------------------------------------------------------  OBSTETRICS REPORT                       (Signed Final 09/06/2019 08:55 am) ---------------------------------------------------------------------- Patient Info  ID #:       [id number]                          D.O.B.:  06-May-1996 (23 yrs)  Name:       Catherine Collins                Visit Date: 09/05/2019 06:17 pm ---------------------------------------------------------------------- Performed By  Performed By:     Percell Boston          Referred By:      MAU Nursing-                    RDMS                                     MAU/Triage  Attending:        Ma Rings MD         [facility] ----------------------------------------------------------------------  Orders   #  Description                          Code         Ordered By   1  US MFM OB TRANSVAGINAL               [id number]      Judeth HornERIN LAWRENCE  ----------------------------------------------------------------------   #  Order #                    Accession #                 Episode #   1  [id number]                  [id number]                  [id number]  ---------------------------------------------------------------------- Indications   Abdominal pain in pregnancy                    O99.89   [redacted] weeks gestation of pregnancy                Z3A.18   Encounter for other specified antenatal        Z36.89   screening (low risk NIPS)   Decreased fetal movement                       O36.8190  ---------------------------------------------------------------------- Fetal Evaluation  Num Of Fetuses:         1  Fetal Heart Rate(bpm):  166  Cardiac Activity:       Observed  Presentation:           Cephalic  Placenta:               Anterior  P. Cord Insertion:      Visualized, central  Amniotic Fluid  AFI FV:      Within normal limits                              Largest Pocket(cm)                               3.69 ---------------------------------------------------------------------- OB History  Gravidity:    2         Term:   1        Prem:   0        SAB:   0  TOP:          0       Ectopic:  0        Living: 1 ---------------------------------------------------------------------- Gestational Age  LMP:           18w 2d        Date:  04/30/19                 EDD:   02/04/20  Best:          Mackie Pai18w 2d     Det. By:  LMP  (04/30/19)          EDD:   02/04/20 ---------------------------------------------------------------------- Anatomy  Thoracic:              Appears normal         Abdominal Wall:  Appears nml (cord                                                                        insert, abd wall)  Heart:                 Appears normal         Cord Vessels:           Appears normal (3                         (4CH, axis, and                                vessel cord)                         situs)  LVOT:                  Appears normal         Kidneys:                Appear normal  Stomach:               Appears normal, left   Bladder:                Appears normal                         sided ---------------------------------------------------------------------- Cervix Uterus Adnexa  Cervix  Length:            3.1  cm.  Measured transvaginally.  Uterus  No abnormality visualized.  Left Ovary  No adnexal mass visualized.  Right Ovary  No adnexal mass visualized.  Cul De Sac  No free fluid seen.  Adnexa  No abnormality visualized. ---------------------------------------------------------------------- Comments  A transvaginal ultrasound performed today showed a total  cervical length of 3.1 cm.  There were no signs of funneling  noted in her cervix. ----------------------------------------------------------------------                   Ma Rings, MD Electronically Signed Final Report   09/06/2019 08:55 am ----------------------------------------------------------------------   Assessment and Plan:    Pregnancy: G2P1001 at [redacted]w[redacted]d 1. Supervision of other normal pregnancy, antepartum Patient is doing well without complaints Anatomy ultrasound on 10/12 with AFP  2. History of prior pregnancy with IUGR newborn   Preterm labor symptoms and general obstetric precautions including but not limited to vaginal bleeding, contractions, leaking of fluid and fetal movement were reviewed in detail with the patient. I discussed the assessment and treatment plan with the patient. The patient was provided an opportunity to ask questions and all were answered. The patient agreed with the plan and demonstrated an understanding of the instructions. The patient was advised to call back or seek an in-person office evaluation/go to MAU at [facility] for any urgent or concerning symptoms. Please refer to After Visit Summary for other counseling recommendations.   I provided 12 minutes of face-to-face time during this encounter.  Return in about 4 weeks (around  10/05/2019) for Virtual, ROB, Low risk.  Future Appointments  Date Time Provider [place]  09/11/2019  1:00 PM [place] MFC-US  09/11/2019  1:00 PM [place] 3 WH-MFCUS MFC-US  09/11/2019  2:10 PM [facility]-WOCA LAB WOC-WOCA WOC  03/06/2020  2:00 PM Melvenia Beam, MD [facility]NA None    Mora Bellman, MD [facility] [place]

## 2019-09-07 NOTE — Progress Notes (Addendum)
I connected with  Catherine Collins on 09/07/19 by a video enabled telemedicine application and verified that I am speaking with the correct person using two identifiers.   HOB.  Reports no problems today.  She also stated that she will be switching OB/GYN.  She does not have her BP cuff.

## 2019-09-11 ENCOUNTER — Other Ambulatory Visit: Payer: Self-pay

## 2019-09-11 ENCOUNTER — Other Ambulatory Visit (HOSPITAL_COMMUNITY): Payer: Self-pay | Admitting: *Deleted

## 2019-09-11 ENCOUNTER — Other Ambulatory Visit: Payer: Self-pay | Admitting: Obstetrics

## 2019-09-11 ENCOUNTER — Ambulatory Visit (HOSPITAL_COMMUNITY): Payer: Medicaid Other | Admitting: *Deleted

## 2019-09-11 ENCOUNTER — Ambulatory Visit (HOSPITAL_COMMUNITY)
Admission: RE | Admit: 2019-09-11 | Discharge: 2019-09-11 | Disposition: A | Discharge status: Home / Self Care | Payer: Medicaid Other | Source: Ambulatory Visit | Attending: Obstetrics and Gynecology | Admitting: Obstetrics and Gynecology

## 2019-09-11 ENCOUNTER — Encounter (HOSPITAL_COMMUNITY): Payer: Self-pay | Admitting: *Deleted

## 2019-09-11 ENCOUNTER — Other Ambulatory Visit: Payer: Medicaid Other

## 2019-09-11 VITALS — BP 123/72 | HR 88 | Temp 98.7°F

## 2019-09-11 DIAGNOSIS — O0990 Supervision of high risk pregnancy, unspecified, unspecified trimester: Secondary | ICD-10-CM | POA: Diagnosis present

## 2019-09-11 DIAGNOSIS — O09292 Supervision of pregnancy with other poor reproductive or obstetric history, second trimester: Secondary | ICD-10-CM | POA: Diagnosis not present

## 2019-09-11 DIAGNOSIS — Z3A19 19 weeks gestation of pregnancy: Secondary | ICD-10-CM | POA: Diagnosis not present

## 2019-09-11 DIAGNOSIS — O99312 Alcohol use complicating pregnancy, second trimester: Secondary | ICD-10-CM

## 2019-09-11 DIAGNOSIS — Z362 Encounter for other antenatal screening follow-up: Secondary | ICD-10-CM

## 2019-09-11 DIAGNOSIS — Z8759 Personal history of other complications of pregnancy, childbirth and the puerperium: Secondary | ICD-10-CM

## 2019-09-12 ENCOUNTER — Other Ambulatory Visit: Payer: Self-pay | Admitting: Obstetrics

## 2019-09-13 LAB — AFP, SERUM, OPEN SPINA BIFIDA
AFP MoM: 0.95
AFP Value: 60.9 ng/mL
Gest. Age on Collection Date: 19.2 weeks
Maternal Age At EDD: 21.4 yr
OSBR Risk 1 IN: 10000
Test Results:: NEGATIVE
Weight: 123 [lb_av]

## 2019-10-09 ENCOUNTER — Other Ambulatory Visit: Payer: Self-pay

## 2019-10-09 ENCOUNTER — Encounter (HOSPITAL_COMMUNITY): Payer: Self-pay | Admitting: *Deleted

## 2019-10-09 ENCOUNTER — Ambulatory Visit (HOSPITAL_COMMUNITY)
Admission: RE | Admit: 2019-10-09 | Discharge: 2019-10-09 | Disposition: A | Discharge status: Home / Self Care | Payer: Medicaid Other | Source: Ambulatory Visit | Attending: Obstetrics | Admitting: Obstetrics

## 2019-10-09 ENCOUNTER — Other Ambulatory Visit (HOSPITAL_COMMUNITY): Payer: Self-pay | Admitting: *Deleted

## 2019-10-09 ENCOUNTER — Ambulatory Visit (HOSPITAL_COMMUNITY): Payer: Medicaid Other | Admitting: *Deleted

## 2019-10-09 VITALS — BP 122/69 | HR 91 | Temp 98.8°F

## 2019-10-09 DIAGNOSIS — Z3A23 23 weeks gestation of pregnancy: Secondary | ICD-10-CM

## 2019-10-09 DIAGNOSIS — O99312 Alcohol use complicating pregnancy, second trimester: Secondary | ICD-10-CM | POA: Diagnosis not present

## 2019-10-09 DIAGNOSIS — F1010 Alcohol abuse, uncomplicated: Secondary | ICD-10-CM | POA: Insufficient documentation

## 2019-10-09 DIAGNOSIS — O09292 Supervision of pregnancy with other poor reproductive or obstetric history, second trimester: Secondary | ICD-10-CM | POA: Diagnosis not present

## 2019-10-09 DIAGNOSIS — O99311 Alcohol use complicating pregnancy, first trimester: Secondary | ICD-10-CM | POA: Insufficient documentation

## 2019-10-09 DIAGNOSIS — Z362 Encounter for other antenatal screening follow-up: Secondary | ICD-10-CM | POA: Insufficient documentation

## 2019-10-09 DIAGNOSIS — Z8759 Personal history of other complications of pregnancy, childbirth and the puerperium: Secondary | ICD-10-CM

## 2019-11-10 ENCOUNTER — Telehealth: Payer: Self-pay

## 2019-11-10 ENCOUNTER — Inpatient Hospital Stay (HOSPITAL_COMMUNITY)
Admission: AD | Admit: 2019-11-10 | Discharge: 2019-11-10 | Disposition: A | Discharge status: Home / Self Care | Payer: Medicaid Other | Attending: Obstetrics and Gynecology | Admitting: Obstetrics and Gynecology

## 2019-11-10 ENCOUNTER — Encounter (HOSPITAL_COMMUNITY): Payer: Self-pay | Admitting: Obstetrics and Gynecology

## 2019-11-10 DIAGNOSIS — Z87891 Personal history of nicotine dependence: Secondary | ICD-10-CM | POA: Diagnosis not present

## 2019-11-10 DIAGNOSIS — Z3A27 27 weeks gestation of pregnancy: Secondary | ICD-10-CM | POA: Diagnosis not present

## 2019-11-10 DIAGNOSIS — Z833 Family history of diabetes mellitus: Secondary | ICD-10-CM | POA: Insufficient documentation

## 2019-11-10 DIAGNOSIS — O26853 Spotting complicating pregnancy, third trimester: Secondary | ICD-10-CM

## 2019-11-10 DIAGNOSIS — Z809 Family history of malignant neoplasm, unspecified: Secondary | ICD-10-CM | POA: Insufficient documentation

## 2019-11-10 DIAGNOSIS — Z79899 Other long term (current) drug therapy: Secondary | ICD-10-CM | POA: Insufficient documentation

## 2019-11-10 DIAGNOSIS — N898 Other specified noninflammatory disorders of vagina: Secondary | ICD-10-CM

## 2019-11-10 DIAGNOSIS — O98212 Gonorrhea complicating pregnancy, second trimester: Secondary | ICD-10-CM | POA: Insufficient documentation

## 2019-11-10 DIAGNOSIS — O26893 Other specified pregnancy related conditions, third trimester: Secondary | ICD-10-CM | POA: Diagnosis not present

## 2019-11-10 LAB — URINALYSIS, ROUTINE W REFLEX MICROSCOPIC
Bilirubin Urine: NEGATIVE
Glucose, UA: NEGATIVE mg/dL
Hgb urine dipstick: NEGATIVE
Ketones, ur: NEGATIVE mg/dL
Nitrite: NEGATIVE
Protein, ur: NEGATIVE mg/dL
Specific Gravity, Urine: 1.023 (ref 1.005–1.030)
pH: 7 (ref 5.0–8.0)

## 2019-11-10 LAB — WET PREP, GENITAL
Clue Cells Wet Prep HPF POC: NONE SEEN
Sperm: NONE SEEN
Trich, Wet Prep: NONE SEEN
Yeast Wet Prep HPF POC: NONE SEEN

## 2019-11-10 NOTE — Discharge Instructions (Signed)
Third Trimester of Pregnancy The third trimester is from week 28 through week 40 (months 7 through 9). The third trimester is a time when the unborn baby (fetus) is growing rapidly. At the end of the ninth month, the fetus is about 20 inches in length and weighs 6-10 pounds. Body changes during your third trimester Your body will continue to go through many changes during pregnancy. The changes vary from woman to woman. During the third trimester:  Your weight will continue to increase. You can expect to gain 25-35 pounds (11-16 kg) by the end of the pregnancy.  You may begin to get stretch marks on your hips, abdomen, and breasts.  You may urinate more often because the fetus is moving lower into your pelvis and pressing on your bladder.  You may develop or continue to have heartburn. This is caused by increased hormones that slow down muscles in the digestive tract.  You may develop or continue to have constipation because increased hormones slow digestion and cause the muscles that push waste through your intestines to relax.  You may develop hemorrhoids. These are swollen veins (varicose veins) in the rectum that can itch or be painful.  You may develop swollen, bulging veins (varicose veins) in your legs.  You may have increased body aches in the pelvis, back, or thighs. This is due to weight gain and increased hormones that are relaxing your joints.  You may have changes in your hair. These can include thickening of your hair, rapid growth, and changes in texture. Some women also have hair loss during or after pregnancy, or hair that feels dry or thin. Your hair will most likely return to normal after your baby is born.  Your breasts will continue to grow and they will continue to become tender. A yellow fluid (colostrum) may leak from your breasts. This is the first milk you are producing for your baby.  Your belly button may stick out.  You may notice more swelling in your hands,  face, or ankles.  You may have increased tingling or numbness in your hands, arms, and legs. The skin on your belly may also feel numb.  You may feel short of breath because of your expanding uterus.  You may have more problems sleeping. This can be caused by the size of your belly, increased need to urinate, and an increase in your body's metabolism.  You may notice the fetus "dropping," or moving lower in your abdomen (lightening).  You may have increased vaginal discharge.  You may notice your joints feel loose and you may have pain around your pelvic bone. What to expect at prenatal visits You will have prenatal exams every 2 weeks until week 36. Then you will have weekly prenatal exams. During a routine prenatal visit:  You will be weighed to make sure you and the baby are growing normally.  Your blood pressure will be taken.  Your abdomen will be measured to track your baby's growth.  The fetal heartbeat will be listened to.  Any test results from the previous visit will be discussed.  You may have a cervical check near your due date to see if your cervix has softened or thinned (effaced).  You will be tested for Group B streptococcus. This happens between 35 and 37 weeks. Your health care provider may ask you:  What your birth plan is.  How you are feeling.  If you are feeling the baby move.  If you have had any abnormal   symptoms, such as leaking fluid, bleeding, severe headaches, or abdominal cramping.  If you are using any tobacco products, including cigarettes, chewing tobacco, and electronic cigarettes.  If you have any questions. Other tests or screenings that may be performed during your third trimester include:  Blood tests that check for low iron levels (anemia).  Fetal testing to check the health, activity level, and growth of the fetus. Testing is done if you have certain medical conditions or if there are problems during the pregnancy.  Nonstress test  (NST). This test checks the health of your baby to make sure there are no signs of problems, such as the baby not getting enough oxygen. During this test, a belt is placed around your belly. The baby is made to move, and its heart rate is monitored during movement. What is false labor? False labor is a condition in which you feel small, irregular tightenings of the muscles in the womb (contractions) that usually go away with rest, changing position, or drinking water. These are called Braxton Hicks contractions. Contractions may last for hours, days, or even weeks before true labor sets in. If contractions come at regular intervals, become more frequent, increase in intensity, or become painful, you should see your health care provider. What are the signs of labor?  Abdominal cramps.  Regular contractions that start at 10 minutes apart and become stronger and more frequent with time.  Contractions that start on the top of the uterus and spread down to the lower abdomen and back.  Increased pelvic pressure and dull back pain.  A watery or bloody mucus discharge that comes from the vagina.  Leaking of amniotic fluid. This is also known as your "water breaking." It could be a slow trickle or a gush. Let your health care provider know if it has a color or strange odor. If you have any of these signs, call your health care provider right away, even if it is before your due date. Follow these instructions at home: Medicines  Follow your health care provider's instructions regarding medicine use. Specific medicines may be either safe or unsafe to take during pregnancy.  Take a prenatal vitamin that contains at least 600 micrograms (mcg) of folic acid.  If you develop constipation, try taking a stool softener if your health care provider approves. Eating and drinking   Eat a balanced diet that includes fresh fruits and vegetables, whole grains, good sources of protein such as meat, eggs, or tofu,  and low-fat dairy. Your health care provider will help you determine the amount of weight gain that is right for you.  Avoid raw meat and uncooked cheese. These carry germs that can cause birth defects in the baby.  If you have low calcium intake from food, talk to your health care provider about whether you should take a daily calcium supplement.  Eat four or five small meals rather than three large meals a day.  Limit foods that are high in fat and processed sugars, such as fried and sweet foods.  To prevent constipation: ? Drink enough fluid to keep your urine clear or pale yellow. ? Eat foods that are high in fiber, such as fresh fruits and vegetables, whole grains, and beans. Activity  Exercise only as directed by your health care provider. Most women can continue their usual exercise routine during pregnancy. Try to exercise for 30 minutes at least 5 days a week. Stop exercising if you experience uterine contractions.  Avoid heavy lifting.  Do   not exercise in extreme heat or humidity, or at high altitudes.  Wear low-heel, comfortable shoes.  Practice good posture.  You may continue to have sex unless your health care provider tells you otherwise. Relieving pain and discomfort  Take frequent breaks and rest with your legs elevated if you have leg cramps or low back pain.  Take warm sitz baths to soothe any pain or discomfort caused by hemorrhoids. Use hemorrhoid cream if your health care provider approves.  Wear a good support bra to prevent discomfort from breast tenderness.  If you develop varicose veins: ? Wear support pantyhose or compression stockings as told by your healthcare provider. ? Elevate your feet for 15 minutes, 3-4 times a day. Prenatal care  Write down your questions. Take them to your prenatal visits.  Keep all your prenatal visits as told by your health care provider. This is important. Safety  Wear your seat belt at all times when driving.  Make  a list of emergency phone numbers, including numbers for family, friends, the hospital, and police and fire departments. General instructions  Avoid cat litter boxes and soil used by cats. These carry germs that can cause birth defects in the baby. If you have a cat, ask someone to clean the litter box for you.  Do not travel far distances unless it is absolutely necessary and only with the approval of your health care provider.  Do not use hot tubs, steam rooms, or saunas.  Do not drink alcohol.  Do not use any products that contain nicotine or tobacco, such as cigarettes and e-cigarettes. If you need help quitting, ask your health care provider.  Do not use any medicinal herbs or unprescribed drugs. These chemicals affect the formation and growth of the baby.  Do not douche or use tampons or scented sanitary pads.  Do not cross your legs for long periods of time.  To prepare for the arrival of your baby: ? Take prenatal classes to understand, practice, and ask questions about labor and delivery. ? Make a trial run to the hospital. ? Visit the hospital and tour the maternity area. ? Arrange for maternity or paternity leave through employers. ? Arrange for family and friends to take care of pets while you are in the hospital. ? Purchase a rear-facing car seat and make sure you know how to install it in your car. ? Pack your hospital bag. ? Prepare the baby's nursery. Make sure to remove all pillows and stuffed animals from the baby's crib to prevent suffocation.  Visit your dentist if you have not gone during your pregnancy. Use a soft toothbrush to brush your teeth and be gentle when you floss. Contact a health care provider if:  You are unsure if you are in labor or if your water has broken.  You become dizzy.  You have mild pelvic cramps, pelvic pressure, or nagging pain in your abdominal area.  You have lower back pain.  You have persistent nausea, vomiting, or diarrhea.   You have an unusual or bad smelling vaginal discharge.  You have pain when you urinate. Get help right away if:  Your water breaks before 37 weeks.  You have regular contractions less than 5 minutes apart before 37 weeks.  You have a fever.  You are leaking fluid from your vagina.  You have spotting or bleeding from your vagina.  You have severe abdominal pain or cramping.  You have rapid weight loss or weight gain.  You have   shortness of breath with chest pain.  You notice sudden or extreme swelling of your face, hands, ankles, feet, or legs.  Your baby makes fewer than 10 movements in 2 hours.  You have severe headaches that do not go away when you take medicine.  You have vision changes. Summary  The third trimester is from week 28 through week 40, months 7 through 9. The third trimester is a time when the unborn baby (fetus) is growing rapidly.  During the third trimester, your discomfort may increase as you and your baby continue to gain weight. You may have abdominal, leg, and back pain, sleeping problems, and an increased need to urinate.  During the third trimester your breasts will keep growing and they will continue to become tender. A yellow fluid (colostrum) may leak from your breasts. This is the first milk you are producing for your baby.  False labor is a condition in which you feel small, irregular tightenings of the muscles in the womb (contractions) that eventually go away. These are called Braxton Hicks contractions. Contractions may last for hours, days, or even weeks before true labor sets in.  Signs of labor can include: abdominal cramps; regular contractions that start at 10 minutes apart and become stronger and more frequent with time; watery or bloody mucus discharge that comes from the vagina; increased pelvic pressure and dull back pain; and leaking of amniotic fluid. This information is not intended to replace advice given to you by your health  care provider. Make sure you discuss any questions you have with your health care provider. Document Released: 11/10/2001 Document Revised: 03/09/2019 Document Reviewed: 12/22/2016 Elsevier Patient Education  2020 [employer]  

## 2019-11-10 NOTE — MAU Provider Note (Signed)
History     CSN: [id number]  Arrival date and time: 11/10/19 1337   First Provider Initiated Contact with Patient 11/10/19 1438      Chief Complaint  Patient presents with  . Vaginal Bleeding   Catherine Collins is a 23 y.o. G2P1001 at 9w6dwho presents today with spotting and cramping. She states that the bleeding started this morning, but has not resolved. She states that the cramping is a heavy feeling that comes and goes. She rates that at 3, but had times can be a 6/10. She reports normal fetal movement. She denies any complications with this pregnancy. She had IUGR with her first pregnancy, but delivered at full term.   Vaginal Bleeding The patient's primary symptoms include pelvic pain and vaginal bleeding. The patient's pertinent negatives include no vaginal discharge. This is a new problem. The current episode started today. The problem occurs intermittently. The problem has been resolved. Pertinent negatives include no chills, dysuria, fever, frequency, nausea, urgency or vomiting. The vaginal discharge was bloody. The vaginal bleeding is spotting (only seen with wiping. ). She has not been passing clots. She has not been passing tissue. Nothing aggravates the symptoms. She has tried nothing for the symptoms. Sexual activity: denies intercourse in the last 24 hours.     OB History    Gravida  2   Para  1   Term  1   Preterm      AB      Living  1     SAB      TAB      Ectopic      Multiple  0   Live Births  1           Past Medical History:  Diagnosis Date  . Anemia   . Anxiety   . Depression   . Mental disorder     Past Surgical History:  Procedure Laterality Date  . WISDOM TOOTH EXTRACTION      Family History  Problem Relation Age of Onset  . Drug abuse Maternal Grandfather   . Diabetes Maternal Grandfather   . Hypertension Maternal Grandfather   . Vision loss Maternal Grandfather   . Cancer Maternal Grandmother   . COPD Maternal  Grandmother     Social History   Tobacco Use  . Smoking status: Former Smoker    Packs/day: 0.50    Types: Cigarettes    Quit date: 02/11/2018    Years since quitting: 1.7  . Smokeless tobacco: Never Used  . Tobacco comment: stopped smoking after preg confirm  Substance Use Topics  . Alcohol use: Not Currently    Alcohol/week: 0.0 standard drinks  . Drug use: No    Allergies: No Known Allergies  Medications Prior to Admission  Medication Sig Dispense Refill Last Dose  . acetaminophen (TYLENOL) 325 MG tablet Take 650 mg by mouth every 6 (six) hours as needed.   11/09/2019 at Unknown time  . Prenat-FeCbn-FeAspGl-FA-Omega (OB COMPLETE PETITE) 35-5-1-200 MG CAPS Take 1 tablet by mouth daily. 30 capsule 12 Past Week at Unknown time  . Blood Pressure Monitor KIT 1 kit by Does not apply route once a week. Check BP weekly. Large Cuff DX: Z34.6 (Patient not taking: Reported on 08/15/2019) 1 kit 0   . ondansetron (ZOFRAN) 4 MG tablet Take 4 mg by mouth 3 (three) times daily as needed.   More than a month at Unknown time  . Prenat-FeFum-FePo-FA-Omega 3 (CONCEPT DHA) 53.5-38-1 MG CAPS Take  1 tablet by mouth daily. (Patient not taking: Reported on 09/11/2019) 30 capsule 12   . Vitamin D, Ergocalciferol, (DRISDOL) 1.25 MG (50000 UT) CAPS capsule Take 50,000 Units by mouth once a week.       Review of Systems  Constitutional: Negative for chills and fever.  Gastrointestinal: Negative for nausea and vomiting.  Genitourinary: Positive for pelvic pain and vaginal bleeding. Negative for dysuria, frequency, urgency and vaginal discharge.   Physical Exam   Blood pressure 114/62, pulse 74, resp. rate 16, height _0  (1.575 m), weight 62.8 kg, last menstrual period 03/02/2019, SpO2 100 %, not currently breastfeeding.  Physical Exam  Nursing note and vitals reviewed. Constitutional: She is oriented to person, place, and time. She appears well-developed and well-nourished. No distress.  HENT:   Head: Normocephalic.  Cardiovascular: Normal rate.  Respiratory: Effort normal.  GI: Soft. There is no abdominal tenderness. There is no rebound.  Genitourinary:    Genitourinary Comments: External: no lesion Vagina: moderate amount of thick, tan discharge  Cervix: pink, smooth, no active bleeding note, but some contact bleeding seen after collecting swabs. Closed/thick  Uterus: AGA   Neurological: She is alert and oriented to person, place, and time.  Skin: Skin is warm and dry.  Psychiatric: She has a normal mood and affect.   NST:  Baseline: 140 Variability: moderate Accels: 15x15 Decels: none Toco: none   Results for orders placed or performed during the hospital encounter of 11/10/19 (from the past 24 hour(s))  Urinalysis, Routine w reflex microscopic     Status: Abnormal   Collection Time: 11/10/19  2:11 PM  Result Value Ref Range   Color, Urine YELLOW YELLOW   APPearance CLOUDY (A) CLEAR   Specific Gravity, Urine 1.023 1.005 - 1.030   pH 7.0 5.0 - 8.0   Glucose, UA NEGATIVE NEGATIVE mg/dL   Hgb urine dipstick NEGATIVE NEGATIVE   Bilirubin Urine NEGATIVE NEGATIVE   Ketones, ur NEGATIVE NEGATIVE mg/dL   Protein, ur NEGATIVE NEGATIVE mg/dL   Nitrite NEGATIVE NEGATIVE   Leukocytes,Ua LARGE (A) NEGATIVE   RBC / HPF 0-5 0 - 5 RBC/hpf   WBC, UA 11-20 0 - 5 WBC/hpf   Bacteria, UA RARE (A) NONE SEEN   Squamous Epithelial / LPF 6-10 0 - 5   Mucus PRESENT    Amorphous Crystal PRESENT   Wet prep, genital     Status: Abnormal   Collection Time: 11/10/19  3:55 PM   Specimen: Vaginal  Result Value Ref Range   Yeast Wet Prep HPF POC NONE SEEN NONE SEEN   Trich, Wet Prep NONE SEEN NONE SEEN   Clue Cells Wet Prep HPF POC NONE SEEN NONE SEEN   WBC, Wet Prep HPF POC MANY (A) NONE SEEN   Sperm NONE SEEN      MAU Course  Procedures  MDM  Assessment and Plan   1. Spotting affecting pregnancy in third trimester   2. Vaginal discharge during pregnancy in third trimester    3. [redacted] weeks gestation of pregnancy      DC home Comfort measures reviewed  3rd Trimester precautions  Bleeding precautions PTL precautions  Fetal kick counts RX: Return to MAU as needed FU with OB as planned  Follow-up Information    [facility] Follow up.   Contact information: [address] [place] [id number] [place] DNP, CNM  11/10/19  4:25  PM    

## 2019-11-10 NOTE — Telephone Encounter (Signed)
Pt called and reports that she is bleeding and cramping, states that it began this morning. Pt denies any recent intercourse, pt reports good fetal movement. I advised pt to go to MAU for evaluation. Pt voices understanding and says she will go.

## 2019-11-10 NOTE — MAU Note (Addendum)
Pt is G2P1 at [redacted]w[redacted]d. She noticed some spotting this morning at work only when she wipes/ She also has some abdominal cramping. +FM. No LOF.

## 2019-11-13 LAB — GC/CHLAMYDIA PROBE AMP (~~LOC~~) NOT AT ARMC
Chlamydia: NEGATIVE
Comment: NEGATIVE
Comment: NORMAL
Neisseria Gonorrhea: POSITIVE — AB

## 2019-11-14 ENCOUNTER — Ambulatory Visit (HOSPITAL_COMMUNITY): Payer: Medicaid Other | Admitting: *Deleted

## 2019-11-14 ENCOUNTER — Ambulatory Visit (INDEPENDENT_AMBULATORY_CARE_PROVIDER_SITE_OTHER): Payer: Medicaid Other | Admitting: Obstetrics

## 2019-11-14 ENCOUNTER — Encounter: Payer: Self-pay | Admitting: Obstetrics

## 2019-11-14 ENCOUNTER — Other Ambulatory Visit: Payer: Self-pay

## 2019-11-14 ENCOUNTER — Other Ambulatory Visit: Payer: Medicaid Other

## 2019-11-14 ENCOUNTER — Encounter (HOSPITAL_COMMUNITY): Payer: Self-pay

## 2019-11-14 ENCOUNTER — Ambulatory Visit (HOSPITAL_COMMUNITY)
Admission: RE | Admit: 2019-11-14 | Discharge: 2019-11-14 | Disposition: A | Discharge status: Home / Self Care | Payer: Medicaid Other | Source: Ambulatory Visit | Attending: Obstetrics and Gynecology | Admitting: Obstetrics and Gynecology

## 2019-11-14 VITALS — Wt 138.0 lb

## 2019-11-14 VITALS — BP 121/72 | HR 99 | Temp 97.7°F

## 2019-11-14 DIAGNOSIS — B379 Candidiasis, unspecified: Secondary | ICD-10-CM

## 2019-11-14 DIAGNOSIS — O98212 Gonorrhea complicating pregnancy, second trimester: Secondary | ICD-10-CM

## 2019-11-14 DIAGNOSIS — A749 Chlamydial infection, unspecified: Secondary | ICD-10-CM

## 2019-11-14 DIAGNOSIS — O0990 Supervision of high risk pregnancy, unspecified, unspecified trimester: Secondary | ICD-10-CM | POA: Insufficient documentation

## 2019-11-14 DIAGNOSIS — O99313 Alcohol use complicating pregnancy, third trimester: Secondary | ICD-10-CM

## 2019-11-14 DIAGNOSIS — Z3A28 28 weeks gestation of pregnancy: Secondary | ICD-10-CM | POA: Diagnosis not present

## 2019-11-14 DIAGNOSIS — Z362 Encounter for other antenatal screening follow-up: Secondary | ICD-10-CM

## 2019-11-14 DIAGNOSIS — B9689 Other specified bacterial agents as the cause of diseases classified elsewhere: Secondary | ICD-10-CM

## 2019-11-14 DIAGNOSIS — Z3A29 29 weeks gestation of pregnancy: Secondary | ICD-10-CM

## 2019-11-14 DIAGNOSIS — O98213 Gonorrhea complicating pregnancy, third trimester: Secondary | ICD-10-CM

## 2019-11-14 DIAGNOSIS — N760 Acute vaginitis: Secondary | ICD-10-CM

## 2019-11-14 DIAGNOSIS — Z8759 Personal history of other complications of pregnancy, childbirth and the puerperium: Secondary | ICD-10-CM | POA: Diagnosis present

## 2019-11-14 DIAGNOSIS — O98812 Other maternal infectious and parasitic diseases complicating pregnancy, second trimester: Secondary | ICD-10-CM

## 2019-11-14 DIAGNOSIS — O09299 Supervision of pregnancy with other poor reproductive or obstetric history, unspecified trimester: Secondary | ICD-10-CM

## 2019-11-14 DIAGNOSIS — O09293 Supervision of pregnancy with other poor reproductive or obstetric history, third trimester: Secondary | ICD-10-CM | POA: Diagnosis not present

## 2019-11-14 DIAGNOSIS — O0992 Supervision of high risk pregnancy, unspecified, second trimester: Secondary | ICD-10-CM

## 2019-11-14 DIAGNOSIS — O09292 Supervision of pregnancy with other poor reproductive or obstetric history, second trimester: Secondary | ICD-10-CM

## 2019-11-14 MED ORDER — AZITHROMYCIN 500 MG PO TABS: 1000.0000 mg | ORAL_TABLET | Freq: Once | ORAL | Status: DC

## 2019-11-14 MED ORDER — METRONIDAZOLE 500 MG PO TABS: 500.0000 mg | ORAL_TABLET | Freq: Two times a day (BID) | ORAL | 2 refills | Status: DC

## 2019-11-14 MED ORDER — AZITHROMYCIN 250 MG PO TABS
1000.0000 mg | ORAL_TABLET | Freq: Once | ORAL | Status: AC
  Administered 2019-11-14: 10:00:00 1000 mg via ORAL

## 2019-11-14 MED ORDER — CEFTRIAXONE SODIUM 250 MG IJ SOLR
250.0000 mg | Freq: Once | INTRAMUSCULAR | Status: AC
  Administered 2019-11-14: 10:00:00 250 mg via INTRAMUSCULAR

## 2019-11-14 MED ORDER — TERCONAZOLE 0.4 % VA CREA: 1.0000 | TOPICAL_CREAM | Freq: Every day | VAGINAL | 0 refills | Status: DC

## 2019-11-14 MED ORDER — CEFTRIAXONE SODIUM 250 MG IJ SOLR: 250.0000 mg | Freq: Once | INTRAMUSCULAR | Status: DC

## 2019-11-15 ENCOUNTER — Telehealth: Payer: Self-pay | Admitting: Lactation Services

## 2019-11-15 ENCOUNTER — Other Ambulatory Visit (HOSPITAL_COMMUNITY): Payer: Self-pay | Admitting: *Deleted

## 2019-11-15 DIAGNOSIS — Z362 Encounter for other antenatal screening follow-up: Secondary | ICD-10-CM

## 2019-11-15 LAB — CBC
Hematocrit: 35.1 % (ref 34.0–46.6)
Hemoglobin: 11.6 g/dL (ref 11.1–15.9)
MCH: 31.3 pg (ref 26.6–33.0)
MCHC: 33 g/dL (ref 31.5–35.7)
MCV: 95 fL (ref 79–97)
Platelets: 233 10*3/uL (ref 150–450)
RBC: 3.71 x10E6/uL — ABNORMAL LOW (ref 3.77–5.28)
RDW: 11.9 % (ref 11.7–15.4)
WBC: 10.4 10*3/uL (ref 3.4–10.8)

## 2019-11-15 LAB — RPR: RPR Ser Ql: NONREACTIVE

## 2019-11-15 LAB — GLUCOSE TOLERANCE, 2 HOURS W/ 1HR
Glucose, 1 hour: 71 mg/dL (ref 65–179)
Glucose, 2 hour: 83 mg/dL (ref 65–152)
Glucose, Fasting: 74 mg/dL (ref 65–91)

## 2019-11-15 LAB — HIV ANTIBODY (ROUTINE TESTING W REFLEX): HIV Screen 4th Generation wRfx: NONREACTIVE

## 2019-11-15 NOTE — Telephone Encounter (Signed)
Pt was informed of her + status yesterday at [facility] and she was treated there. Pt reports she has not informed partner and he has not been treated. Pt wants to know if she needs to take the prescription that was sent in by her Physician yesterday. Will forward to [facility] to follow up.

## 2019-11-15 NOTE — Telephone Encounter (Signed)
Spoke with pt and made clarification on medications and results.

## 2019-11-15 NOTE — Telephone Encounter (Signed)
-----   Message from Catherine Collins, CNM sent at 11/15/2019 10:45 AM EST ----- Patient with +Gonorrhea. She needs to come in for treatment. Please call.

## 2019-11-20 ENCOUNTER — Encounter: Payer: Self-pay | Admitting: Obstetrics

## 2019-11-20 NOTE — Progress Notes (Signed)
Subjective:  Catherine Collins is a 23 y.o. G2P1001 at [redacted]w[redacted]d being seen today for ongoing prenatal care.  She is currently monitored for the following issues for this high-risk pregnancy and has History of depression; Late prenatal care; Chlamydia infection affecting pregnancy in second trimester; Gonorrhea affecting pregnancy in second trimester; History of prior pregnancy with IUGR newborn; History of tobacco use; and Supervision of other normal pregnancy, antepartum on their problem list.  Patient reports no complaints.  Contractions: Not present. Vag. Bleeding: Scant.  Movement: Present. Denies leaking of fluid.   The following portions of the patient's history were reviewed and updated as appropriate: allergies, current medications, past family history, past medical history, past social history, past surgical history and problem list. Problem list updated.  Objective:   Vitals:   11/14/19 0817  Weight: 138 lb (62.6 kg)    Fetal Status: Fetal Heart Rate (bpm): 140   Movement: Present     General:  Alert, oriented and cooperative. Patient is in no acute distress.  Skin: Skin is warm and dry. No rash noted.   Cardiovascular: Normal heart rate noted  Respiratory: Normal respiratory effort, no problems with respiration noted  Abdomen: Soft, gravid, appropriate for gestational age. Pain/Pressure: Present     Pelvic:  Cervical exam deferred        Extremities: Normal range of motion.  Edema: Mild pitting, slight indentation  Mental Status: Normal mood and affect. Normal behavior. Normal judgment and thought content.   Urinalysis:      Assessment and Plan:  Pregnancy: G2P1001 at [redacted]w[redacted]d  1. Supervision of high risk pregnancy, antepartum  2. IUGR (intrauterine growth restriction) in prior pregnancy, pregnant Rx: - HIV antibody - RPR - CBC - Glucose Tolerance, 2 Hours w/1 Hour - Glucose Tolerance, 2 Hours w/1 Hour - HIV antibody - RPR - CBC  3. Gonorrhea affecting pregnancy in  second trimester Rx: - cefTRIAXone (ROCEPHIN) injection 250 mg - azithromycin (ZITHROMAX) tablet 1,000 mg  4. Chlamydia infection affecting pregnancy in second trimester - treated  5. Candida albicans infection Rx: - terconazole (TERAZOL 7) 0.4 % vaginal cream; Place 1 applicator vaginally at bedtime.  Dispense: 45 g; Refill: 0  6. BV (bacterial vaginosis) Rx: - metroNIDAZOLE (FLAGYL) 500 MG tablet; Take 1 tablet (500 mg total) by mouth 2 (two) times daily.  Dispense: 14 tablet; Refill: 2  Preterm labor symptoms and general obstetric precautions including but not limited to vaginal bleeding, contractions, leaking of fluid and fetal movement were reviewed in detail with the patient. Please refer to After Visit Summary for other counseling recommendations.  Return in about 2 weeks (around 11/28/2019) for MyChart [facility]-Faculty Only.   Shelly Bombard, MD  11/20/2019

## 2019-11-28 ENCOUNTER — Telehealth (INDEPENDENT_AMBULATORY_CARE_PROVIDER_SITE_OTHER): Payer: Medicaid Other | Admitting: Medical

## 2019-11-28 ENCOUNTER — Encounter: Payer: Self-pay | Admitting: Medical

## 2019-11-28 ENCOUNTER — Other Ambulatory Visit: Payer: Self-pay

## 2019-11-28 DIAGNOSIS — Z3A30 30 weeks gestation of pregnancy: Secondary | ICD-10-CM

## 2019-11-28 DIAGNOSIS — H53413 Scotoma involving central area, bilateral: Secondary | ICD-10-CM

## 2019-11-28 DIAGNOSIS — O26893 Other specified pregnancy related conditions, third trimester: Secondary | ICD-10-CM

## 2019-11-28 DIAGNOSIS — O98212 Gonorrhea complicating pregnancy, second trimester: Secondary | ICD-10-CM

## 2019-11-28 DIAGNOSIS — Z8759 Personal history of other complications of pregnancy, childbirth and the puerperium: Secondary | ICD-10-CM

## 2019-11-28 DIAGNOSIS — Z3480 Encounter for supervision of other normal pregnancy, unspecified trimester: Secondary | ICD-10-CM

## 2019-11-28 DIAGNOSIS — M5430 Sciatica, unspecified side: Secondary | ICD-10-CM

## 2019-11-28 DIAGNOSIS — O0930 Supervision of pregnancy with insufficient antenatal care, unspecified trimester: Secondary | ICD-10-CM

## 2019-11-28 DIAGNOSIS — O0933 Supervision of pregnancy with insufficient antenatal care, third trimester: Secondary | ICD-10-CM

## 2019-11-28 DIAGNOSIS — O98213 Gonorrhea complicating pregnancy, third trimester: Secondary | ICD-10-CM

## 2019-11-28 NOTE — Progress Notes (Signed)
Pt presents for a mychart visit. Pt identified with 2 patient identifiers. Pt unable to check bp at this time. Pt did complete treatment for GC. She states that she did complete the flagyl or terconazole. Pt has no concerns today.

## 2019-11-28 NOTE — Progress Notes (Signed)
I connected with Catherine Collins on 11/28/19 at 11:15 AM EST by: MyChart and verified that I am speaking with the correct person using two identifiers.  Patient is located at home and provider is located at [facility].     The purpose of this virtual visit is to provide medical care while limiting exposure to the novel coronavirus. I discussed the limitations, risks, security and privacy concerns of performing an evaluation and management service by MyChart and the availability of in person appointments. I also discussed with the patient that there may be a patient responsible charge related to this service. By engaging in this virtual visit, you consent to the provision of healthcare.  Additionally, you authorize for your insurance to be billed for the services provided during this visit.  The patient expressed understanding and agreed to proceed.  The following staff members participated in the virtual visit:  Elyn Peers, CMA    PRENATAL VISIT NOTE  Subjective:  Catherine Collins is a 23 y.o. G2P1001 at [redacted]w[redacted]d  for phone visit for ongoing prenatal care.  She is currently monitored for the following issues for this high-risk pregnancy and has History of depression; Late prenatal care; Gonorrhea affecting pregnancy in second trimester; History of prior pregnancy with IUGR newborn; History of tobacco use; and Supervision of other normal pregnancy, antepartum on their problem list.  Patient reports backache and scotoma.  Contractions: Not present. Vag. Bleeding: None.  Movement: Present. Denies leaking of fluid.   The following portions of the patient's history were reviewed and updated as appropriate: allergies, current medications, past family history, past medical history, past social history, past surgical history and problem list.   Objective:  There were no vitals filed for this visit. Self-Obtained  Fetal Status:     Movement: Present     Assessment and Plan:  Pregnancy: G2P1001 at  [redacted]w[redacted]d 1. Supervision of other normal pregnancy, antepartum - Discussed planned MOC - planning condoms   2. History of prior pregnancy with IUGR newborn - Last [place] EFW 34% - Follow-up [facility] [place] scheduled 12/12/19  3. Late prenatal care  4. Gonorrhea affecting pregnancy in second trimester - Treated 12/15 in the office - TOC at next visit   5. Visual field scotoma of both eyes - Intermittent - Patient states also occasional, mild headaches that resolve with Tylenol - Unchanged peripheral edema - Denies RUQ pain - Does not have BP cuff at home, will come for BP check today or tomorrow - CMA to follow-up on BP cuff from [facility]   6. Sciatic nerve pain, unspecified laterality - Discussed use of abdominal binder and Tylenol for pain relief  - If symptoms worsen, will consider referral to PT   Preterm labor symptoms and general obstetric precautions including but not limited to vaginal bleeding, contractions, leaking of fluid and fetal movement were reviewed in detail with the patient.  Return in about 2 weeks (around 12/12/2019) for LOB, In-Person.  Future Appointments  Date Time Provider [place]  11/28/2019 11:15 AM Luvenia Redden, PA-C [facility] None  12/12/2019 12:45 PM [place] NURSE [facility]  12/12/2019 12:45 PM [place] 2 [facility]  03/06/2020  2:00 PM Melvenia Beam, MD [facility] None     Time spent on virtual visit: 15 minutes  Kerry Hough, PA-C

## 2019-11-28 NOTE — Patient Instructions (Signed)
Fetal Movement Counts Patient Name: ________________________________________________ Patient Due Date: ____________________ What is a fetal movement count?  A fetal movement count is the number of times that you feel your baby move during a certain amount of time. This may also be called a fetal kick count. A fetal movement count is recommended for every pregnant woman. You may be asked to start counting fetal movements as early as week 28 of your pregnancy. Pay attention to when your baby is most active. You may notice your baby's sleep and wake cycles. You may also notice things that make your baby move more. You should do a fetal movement count:  When your baby is normally most active.  At the same time each day. A good time to count movements is while you are resting, after having something to eat and drink. How do I count fetal movements? 1. Find a quiet, comfortable area. Sit, or lie down on your side. 2. Write down the date, the start time and stop time, and the number of movements that you felt between those two times. Take this information with you to your health care visits. 3. For 2 hours, count kicks, flutters, swishes, rolls, and jabs. You should feel at least 10 movements during 2 hours. 4. You may stop counting after you have felt 10 movements. 5. If you do not feel 10 movements in 2 hours, have something to eat and drink. Then, keep resting and counting for 1 hour. If you feel at least 4 movements during that hour, you may stop counting. Contact a health care provider if:  You feel fewer than 4 movements in 2 hours.  Your baby is not moving like he or she usually does. Date: ____________ Start time: ____________ Stop time: ____________ Movements: ____________ Date: ____________ Start time: ____________ Stop time: ____________ Movements: ____________ Date: ____________ Start time: ____________ Stop time: ____________ Movements: ____________ Date: ____________ Start time:  ____________ Stop time: ____________ Movements: ____________ Date: ____________ Start time: ____________ Stop time: ____________ Movements: ____________ Date: ____________ Start time: ____________ Stop time: ____________ Movements: ____________ Date: ____________ Start time: ____________ Stop time: ____________ Movements: ____________ Date: ____________ Start time: ____________ Stop time: ____________ Movements: ____________ Date: ____________ Start time: ____________ Stop time: ____________ Movements: ____________ This information is not intended to replace advice given to you by your health care provider. Make sure you discuss any questions you have with your health care provider. Document Released: 12/16/2006 Document Revised: 12/06/2018 Document Reviewed: 12/26/2015 Elsevier Patient Education  2020 [employer] Braxton Hicks Contractions Contractions of the uterus can occur throughout pregnancy, but they are not always a sign that you are in labor. You may have practice contractions called Braxton Hicks contractions. These false labor contractions are sometimes confused with true labor. What are Braxton Hicks contractions? Braxton Hicks contractions are tightening movements that occur in the muscles of the uterus before labor. Unlike true labor contractions, these contractions do not result in opening (dilation) and thinning of the cervix. Toward the end of pregnancy (32-34 weeks), Braxton Hicks contractions can happen more often and may become stronger. These contractions are sometimes difficult to tell apart from true labor because they can be very uncomfortable. You should not feel embarrassed if you go to the hospital with false labor. Sometimes, the only way to tell if you are in true labor is for your health care provider to look for changes in the cervix. The health care provider will do a physical exam and may monitor your contractions. If you   are not in true labor, the exam should show  that your cervix is not dilating and your water has not broken. If there are no other health problems associated with your pregnancy, it is completely safe for you to be sent home with false labor. You may continue to have Braxton Hicks contractions until you go into true labor. How to tell the difference between true labor and false labor True labor  Contractions last 30-70 seconds.  Contractions become very regular.  Discomfort is usually felt in the top of the uterus, and it spreads to the lower abdomen and low back.  Contractions do not go away with walking.  Contractions usually become more intense and increase in frequency.  The cervix dilates and gets thinner. False labor  Contractions are usually shorter and not as strong as true labor contractions.  Contractions are usually irregular.  Contractions are often felt in the front of the lower abdomen and in the groin.  Contractions may go away when you walk around or change positions while lying down.  Contractions get weaker and are shorter-lasting as time goes on.  The cervix usually does not dilate or become thin. Follow these instructions at home:   Take over-the-counter and prescription medicines only as told by your health care provider.  Keep up with your usual exercises and follow other instructions from your health care provider.  Eat and drink lightly if you think you are going into labor.  If Braxton Hicks contractions are making you uncomfortable: ? Change your position from lying down or resting to walking, or change from walking to resting. ? Sit and rest in a tub of warm water. ? Drink enough fluid to keep your urine pale yellow. Dehydration may cause these contractions. ? Do slow and deep breathing several times an hour.  Keep all follow-up prenatal visits as told by your health care provider. This is important. Contact a health care provider if:  You have a fever.  You have continuous pain in  your abdomen. Get help right away if:  Your contractions become stronger, more regular, and closer together.  You have fluid leaking or gushing from your vagina.  You pass blood-tinged mucus (bloody show).  You have bleeding from your vagina.  You have low back pain that you never had before.  You feel your baby's head pushing down and causing pelvic pressure.  Your baby is not moving inside you as much as it used to. Summary  Contractions that occur before labor are called Braxton Hicks contractions, false labor, or practice contractions.  Braxton Hicks contractions are usually shorter, weaker, farther apart, and less regular than true labor contractions. True labor contractions usually become progressively stronger and regular, and they become more frequent.  Manage discomfort from Braxton Hicks contractions by changing position, resting in a warm bath, drinking plenty of water, or practicing deep breathing. This information is not intended to replace advice given to you by your health care provider. Make sure you discuss any questions you have with your health care provider. Document Released: 04/01/2017 Document Revised: 10/29/2017 Document Reviewed: 04/01/2017 Elsevier Patient Education  2020 Elsevier Inc.  

## 2019-11-29 ENCOUNTER — Ambulatory Visit: Payer: Medicaid Other | Admitting: *Deleted

## 2019-11-29 DIAGNOSIS — Z3480 Encounter for supervision of other normal pregnancy, unspecified trimester: Secondary | ICD-10-CM

## 2019-11-29 MED ORDER — BLOOD PRESSURE MONITOR KIT: 1.0000 | PACK | 0 refills | Status: DC

## 2019-11-29 NOTE — Progress Notes (Signed)
Pt is in office for BP check. Pt did not have cuff to check BP at visit yesterday. Pt states she does have some occ dizziness and sees spots.  Pt BP in office today is normal.  Pt made aware that she should make sure she is staying well hydrated- may include one caffine drink per day - and eating a well balanced diet.  Pt made aware that BP cuff has been reordered to [facility] and advised to follow up with them for pick up.  Pt advised to call if she contact office if she has any problems getting cuff.  BP 112/74   Pulse (!) 108   Wt 143 lb (64.9 kg)   LMP 03/02/2019   BMI 26.16 kg/m    Provider made aware BP today.

## 2019-11-29 NOTE — Progress Notes (Signed)
Patient seen and assessed by nursing staff during this encounter. I have reviewed the chart and agree with the documentation and plan.  BP check appointment was requested as patient did not have access to BP cuff at home and complained of scotoma yesterday. Nursing staff set up with BP cuff through [facility] today.   Kerry Hough, PA-C 11/29/2019 1:48 PM

## 2019-12-01 NOTE — L&D Delivery Note (Addendum)
OB/GYN Faculty Practice Delivery Note  Catherine Collins is a 24 y.o. [id number] s/p nSVD at [redacted]w[redacted]d. She was admitted for SOL.   ROM: 6h 59m with clear fluid GBS Status:  --Theda Sers (02/08 1151) Maximum Maternal Temperature: 98.2 F  Labor Progress: Patient presented to L&D for SOL. Initial SVE: 6. Labor course was uncomplicated. She then progressed spontaneously to complete.   Delivery Date/Time: 01/29/2020 @ 1535 Delivery: Called to room and patient was complete and pushing. Head position was LOP and delivered with ease over the perineum. No nuchal cord present. Shoulder and body delivered in usual fashion. Infant with spontaneous cry, placed on mother's abdomen, dried and stimulated. Cord clamped x 2 after 1-minute delay, and cut by patient. Cord blood drawn. Placenta delivered spontaneously with gentle cord traction. Fundus firm with massage and pitocin started. Labia, perineum, vagina, and cervix inspected and significant for no lacerations.  Baby Weight: 3035g  Cord: central insertion, 3 vessel Placenta: Sent to L&D Complications: None Lacerations: None EBL: 150cc Analgesia: Epidural   Infant: APGAR (1 MIN): 9   APGAR (5 MINS): 9    Aldean Jewett MD, PGY-1 OBGYN Faculty Teaching Service  01/29/2020, 3:49 PM   OB FELLOW ATTESTATION  I was present, gloved, and supervising throughout delivery and have edited the above note to reflect any changes or updates.  Zack Seal, MD/[facility] OB Fellow  01/29/2020, 6:59 PM

## 2019-12-06 ENCOUNTER — Other Ambulatory Visit: Payer: Self-pay

## 2019-12-06 DIAGNOSIS — Z3480 Encounter for supervision of other normal pregnancy, unspecified trimester: Secondary | ICD-10-CM

## 2019-12-06 MED ORDER — BLOOD PRESSURE MONITOR KIT: 1.0000 | PACK | 0 refills | Status: AC

## 2019-12-06 NOTE — Progress Notes (Signed)
B/P Rx resent was printed and not given to pt Rx sent electronically today.

## 2019-12-07 ENCOUNTER — Encounter (HOSPITAL_COMMUNITY): Payer: Self-pay | Admitting: Family Medicine

## 2019-12-07 ENCOUNTER — Inpatient Hospital Stay (HOSPITAL_COMMUNITY)
Admission: AD | Admit: 2019-12-07 | Discharge: 2019-12-07 | Disposition: A | Discharge status: Home / Self Care | Payer: Medicaid Other | Attending: Family Medicine | Admitting: Family Medicine

## 2019-12-07 ENCOUNTER — Other Ambulatory Visit: Payer: Self-pay

## 2019-12-07 DIAGNOSIS — Z79899 Other long term (current) drug therapy: Secondary | ICD-10-CM | POA: Insufficient documentation

## 2019-12-07 DIAGNOSIS — M545 Low back pain: Secondary | ICD-10-CM | POA: Insufficient documentation

## 2019-12-07 DIAGNOSIS — Z87891 Personal history of nicotine dependence: Secondary | ICD-10-CM | POA: Diagnosis not present

## 2019-12-07 DIAGNOSIS — O368130 Decreased fetal movements, third trimester, not applicable or unspecified: Secondary | ICD-10-CM | POA: Insufficient documentation

## 2019-12-07 DIAGNOSIS — R109 Unspecified abdominal pain: Secondary | ICD-10-CM | POA: Diagnosis not present

## 2019-12-07 DIAGNOSIS — D649 Anemia, unspecified: Secondary | ICD-10-CM | POA: Insufficient documentation

## 2019-12-07 DIAGNOSIS — O4693 Antepartum hemorrhage, unspecified, third trimester: Secondary | ICD-10-CM | POA: Diagnosis not present

## 2019-12-07 DIAGNOSIS — R42 Dizziness and giddiness: Secondary | ICD-10-CM | POA: Diagnosis not present

## 2019-12-07 DIAGNOSIS — O4703 False labor before 37 completed weeks of gestation, third trimester: Secondary | ICD-10-CM

## 2019-12-07 DIAGNOSIS — Z3A31 31 weeks gestation of pregnancy: Secondary | ICD-10-CM | POA: Diagnosis not present

## 2019-12-07 DIAGNOSIS — O99891 Other specified diseases and conditions complicating pregnancy: Secondary | ICD-10-CM | POA: Diagnosis not present

## 2019-12-07 DIAGNOSIS — O26893 Other specified pregnancy related conditions, third trimester: Secondary | ICD-10-CM | POA: Diagnosis not present

## 2019-12-07 DIAGNOSIS — O99013 Anemia complicating pregnancy, third trimester: Secondary | ICD-10-CM | POA: Diagnosis not present

## 2019-12-07 DIAGNOSIS — R102 Pelvic and perineal pain: Secondary | ICD-10-CM | POA: Insufficient documentation

## 2019-12-07 DIAGNOSIS — O98213 Gonorrhea complicating pregnancy, third trimester: Secondary | ICD-10-CM | POA: Diagnosis not present

## 2019-12-07 DIAGNOSIS — R110 Nausea: Secondary | ICD-10-CM | POA: Insufficient documentation

## 2019-12-07 LAB — URINALYSIS, ROUTINE W REFLEX MICROSCOPIC
Bilirubin Urine: NEGATIVE
Glucose, UA: NEGATIVE mg/dL
Hgb urine dipstick: NEGATIVE
Ketones, ur: NEGATIVE mg/dL
Leukocytes,Ua: NEGATIVE
Nitrite: NEGATIVE
Protein, ur: NEGATIVE mg/dL
Specific Gravity, Urine: 1.019 (ref 1.005–1.030)
pH: 8 (ref 5.0–8.0)

## 2019-12-07 LAB — WET PREP, GENITAL
Clue Cells Wet Prep HPF POC: NONE SEEN
Sperm: NONE SEEN
Trich, Wet Prep: NONE SEEN
Yeast Wet Prep HPF POC: NONE SEEN

## 2019-12-07 MED ORDER — BETAMETHASONE SOD PHOS & ACET 6 (3-3) MG/ML IJ SUSP
12.0000 mg | Freq: Once | INTRAMUSCULAR | Status: AC
  Administered 2019-12-07: 12 mg via INTRAMUSCULAR
  Filled 2019-12-07: qty 5

## 2019-12-07 MED ORDER — LIDOCAINE HCL (PF) 1 % IJ SOLN
5.0000 mL | Freq: Once | INTRAMUSCULAR | Status: AC
  Administered 2019-12-07: 5 mL
  Filled 2019-12-07: qty 5

## 2019-12-07 MED ORDER — CEFTRIAXONE SODIUM 1 G IJ SOLR
500.0000 mg | Freq: Once | INTRAMUSCULAR | Status: AC
  Administered 2019-12-07: 500 mg via INTRAMUSCULAR
  Filled 2019-12-07: qty 10

## 2019-12-07 MED ORDER — LACTATED RINGERS IV BOLUS
1000.0000 mL | Freq: Once | INTRAVENOUS | Status: AC
  Administered 2019-12-07: 1000 mL via INTRAVENOUS

## 2019-12-07 MED ORDER — NIFEDIPINE 10 MG PO CAPS
10.0000 mg | ORAL_CAPSULE | ORAL | Status: AC | PRN
  Administered 2019-12-07 (×3): 10 mg via ORAL
  Filled 2019-12-07 (×3): qty 1

## 2019-12-07 NOTE — MAU Note (Signed)
Catherine Collins is a 24 y.o. at [redacted]w[redacted]d here in MAU reporting: yesterday was having lower back pain and then noticed some spotting. It was light pink. This AM noticed some more spotting and states it was more then yesterday. Having some back pain still but states it is not as bad as yesterday. No LOF. Decreased FM. Had IC this morning.  Onset of complaint: yesterday  Pain score: 3/10  Vitals:   12/07/19 1344  BP: 116/69  Pulse: 90  Resp: 16  SpO2: 98%  (no temp probe covers in triage, will take once pt is in room)    FHT:142  Lab orders placed from triage: UA

## 2019-12-07 NOTE — Discharge Instructions (Signed)
Expedited Partner Therapy:  °Information Sheet for Patients and Partners  °            ° °You have been offered expedited partner therapy (EPT). This information sheet contains important information and warnings you need to be aware of, so please read it carefully.  ° °Expedited Partner Therapy (EPT) is the clinical practice of treating the sexual partners of persons who receive chlamydia, gonorrhea, or trichomoniasis diagnoses by providing medications or prescriptions to the patient. Patients then provide partners with these therapies without the health-care provider having examined the partner. In other words, EPT is a convenient, fast and private way for patients to help their sexual partners get treated.  ° °Chlamydia and gonorrhea are bacterial infections you get from having sex with a person who is already infected. Trichomoniasis (or “trich”) is a very common sexually transmitted infection (STI) that is caused by infection with a protozoan parasite called Trichomonas vaginalis.  Many people with these infections don’t know it because they feel fine, but without treatment these infections can cause serious health problems, such as pelvic inflammatory disease, ectopic pregnancy, infertility and increased risk of HIV.  ° °It is important to get treated as soon as possible to protect your health, to avoid spreading these infections to others, and to prevent yourself from becoming re-infected. The good news is these infections can be easily cured with proper antibiotic medicine. The best way to take care of your self is to see a doctor or go to your local health department. If you are not able to see a doctor or other medical provider, you should take EPT.  ° ° °Recommended Medication: °EPT for Chlamydia:  Azithromycin (Zithromax) 1 gram orally in a single dose °EPT for Gonorrhea:  Cefixime (Suprax) 400 milligrams orally in a single dose PLUS azithromycin (Zithromax) 1 gram orally in a single dose °EPT for  Trichomoniasis:  Metronidazole (Flagyl) 2 grams orally in a single dose ° ° °These medicines are very safe. However, you should not take them if you have ever had an allergic reaction (like a rash) to any of these medicines: azithromycin (Zithromax), erythromycin, clarithromycin (Biaxin), metronidazole (Flagyl), tinidazole (Tindimax). If you are uncertain about whether you have an allergy, call your medical provider or pharmacist before taking this medicine. If you have a serious, long-term illness like kidney, liver or heart disease, colitis or stomach problems, or you are currently taking other prescription medication, talk to your provider before taking this medication.  ° °Women: If you have lower belly pain, pain during sex, vomiting, or a fever, do not take this medicine. Instead, you should see a medical provider to be certain you do not have pelvic inflammatory disease (PID). PID can be serious and lead to infertility, pregnancy problems or chronic pelvic pain.  ° °Pregnant Women: It is very important for you to see a doctor to get pregnancy services and pre-natal care. These antibiotics for EPT are safe for pregnant women, but you still need to see a medical provider as soon as possible. It is also important to note that Doxycycline is an alternative therapy for chlamydia, but it should not be taken by someone who is pregnant.  ° °Men: If you have pain or swelling in the testicles or a fever, do not take this medicine and see a medical provider.    ° °Men who have sex with men (MSM): MSM in [place] continue to experience high rates of syphilis and HIV. Many MSM with gonorrhea or   chlamydia could also have syphilis and/or HIV and not know it. If you are a man who has sex with other men, it is very important that you see a medical provider and are tested for HIV and syphilis. EPT is not recommended for gonorrhea for MSM.  Recommended treatment for gonorrhea for MSM is Rocephin (shot) AND azithromycin  due to decreased cure rate.  Please see your medical provider if this is the case.   ° °Along with this information sheet is a prescription for the medicine. If you receive a prescription it will be in your name and will indicate your date of birth, or it will be in the name of “Expedited Partner Therapy”.   In either case, you can have the prescription filled at a pharmacy. You will be responsible for the cost of the medicine, unless you have prescription drug coverage. In that case, you could provide your name so the pharmacy could bill your health plan.  ° °Take the medication as directed. Some people will have a mild, upset stomach, which does not last long. AVOID alcohol 24 hours after taking metronidazole (Flagyl) to reduce the possibility of a disulfiram-like reaction (severe vomiting and abdominal pain).  After taking the medicine, do not have sex for 7 days. Do not share this medicine or give it to anyone else. It is important to tell everyone you have had sex with in the last 60 days that they need to go and get tested for sexually transmitted infections.  ° °Ways to prevent these and other sexually transmitted infections (STIs):  ° °• Abstain from sex. This is the only sure way to avoid getting an STI.  °• Use barrier methods, such as condoms, consistently and correctly.  °• Limit the number of sexual partners.  °• Have regular physical exams, including testing for STIs.  ° °For more information about EPT or other issues pertaining to an STI, please contact your medical provider or the [place] [facility] at [phone] or [email or web address]   ° °

## 2019-12-07 NOTE — MAU Provider Note (Signed)
Chief Complaint:  Back Pain, Vaginal Bleeding, and Decreased Fetal Movement   First Provider Initiated Contact with Patient 12/07/19 1410      HPI: Catherine Collins is a 24 y.o. G2P1001 at 42w5dby LMP who presents to maternity admissions reporting lower back pain, abdominal cramping and vaginal bleeding. Patient reports that yesterday morning (12/06/19) she had discharge that felt identical to when she lost her mucous plug during her last pregnancy. Later that afternoon she started having low dull back pain and when she went to the bathroom she noticed blood when she wiped, and started to experience minor abdominal cramping. [facility] this morning, but reports that her cramps and back pain have somewhat subsided.  She reports good fetal movement, denies LOF,  vaginal itching/burning, urinary symptoms, h/a, or fever/chills but endorses some fatigue, lightheadedness and nausea.  Patient was treated for gonorrhea several weeks ago but has not had test to confirm negativity, and is unsure if her partner has been treated.   Past Medical History: Past Medical History:  Diagnosis Date  . Anemia   . Anxiety   . Depression   . Mental disorder     Past obstetric history: OB History  Gravida Para Term Preterm AB Living  _0 SAB TAB Ectopic Multiple Live Births        0 1    # Outcome Date GA Lbr Len/2nd Weight Sex Delivery Anes PTL Lv  2 Current           1 Term 09/14/18 348w1d1:30 / 01:23 2810 g F Vag-Spont EPI  LIV    Past Surgical History: Past Surgical History:  Procedure Laterality Date  . WISDOM TOOTH EXTRACTION      Family History: Family History  Problem Relation Age of Onset  . Drug abuse Maternal Grandfather   . Diabetes Maternal Grandfather   . Hypertension Maternal Grandfather   . Vision loss Maternal Grandfather   . Cancer Maternal Grandmother   . COPD Maternal Grandmother     Social History: Social History    Tobacco Use  . Smoking status: Former Smoker    Packs/day: 0.50    Types: Cigarettes    Quit date: 02/11/2018    Years since quitting: 1.8  . Smokeless tobacco: Never Used  . Tobacco comment: stopped smoking after preg confirm  Substance Use Topics  . Alcohol use: Not Currently    Alcohol/week: 0.0 standard drinks  . Drug use: No    Allergies: No Known Allergies  Meds:  Facility-Administered Medications Prior to Admission  Medication Dose Route Frequency Provider Last Rate Last Admin  . azithromycin (ZITHROMAX) tablet 1,000 mg  1,000 mg Oral Once HaShelly BombardMD      . cefTRIAXone (ROCEPHIN) injection 250 mg  250 mg Intramuscular Once HaShelly BombardMD       Medications Prior to Admission  Medication Sig Dispense Refill Last Dose  . acetaminophen (TYLENOL) 325 MG tablet Take 650 mg by mouth every 6 (six) hours as needed.     . Blood Pressure Monitor KIT 1 kit by Does not apply route once a week. Check BP weekly. Large Cuff DX: Z34.6 1 kit 0   . metroNIDAZOLE (FLAGYL) 500 MG tablet Take 1 tablet (500 mg total) by mouth 2 (two) times daily. (Patient not taking: Reported on 11/28/2019) 14 tablet 2   . ondansetron (ZOFRAN) 4 MG tablet  Take 4 mg by mouth 3 (three) times daily as needed.     . Prenat-FeCbn-FeAspGl-FA-Omega (OB COMPLETE PETITE) 35-5-1-200 MG CAPS Take 1 tablet by mouth daily. 30 capsule 12   . terconazole (TERAZOL 7) 0.4 % vaginal cream Place 1 applicator vaginally at bedtime. (Patient not taking: Reported on 11/28/2019) 45 g 0     ROS:  Review of Systems  Constitutional: Positive for fatigue. Negative for chills and fever.  Respiratory: Negative for shortness of breath.   Cardiovascular: Negative for chest pain.  Gastrointestinal: Positive for abdominal pain and nausea.  Genitourinary: Positive for pelvic pain and vaginal bleeding. Negative for dysuria, flank pain and vaginal pain.  Musculoskeletal: Positive for back pain.  Neurological: Positive for  dizziness, light-headedness and headaches.   I have reviewed patient's Past Medical Hx, Surgical Hx, Family Hx, Social Hx, medications and allergies.   Physical Exam   Patient Vitals for the past 24 hrs:  BP Temp Temp src Pulse Resp SpO2 Height Weight  12/07/19 1804 - - - (!) 104 - - - -  12/07/19 1803 116/66 - - (!) 110 - - - -  12/07/19 1610 113/60 - - - - - - -  12/07/19 1548 132/72 - - - - - - -  12/07/19 1527 123/71 - - - - - - -  12/07/19 1400 - 98.2 F (36.8 C) Oral - - - - -  12/07/19 1344 116/69 - - 90 16 98 % - -  12/07/19 1339 - - - - - - _0  (1.575 m) 65.5 kg   Constitutional: Well-developed, well-nourished female in no acute distress.  Cardiovascular: normal rate Respiratory: normal effort GI: Abd soft, non-tender, gravid appropriate for gestational age.  MS: Extremities nontender, no edema, normal ROM Neurologic: Alert and oriented x 4.  GU: Neg CVAT  PELVIC EXAM: Cervix pink, visually open, without lesion, moderate thick white discharge, vaginal walls and external genitalia normal  CERVICAL EXAM (1457) Dilation: 2.5 Effacement (%): 50 Cervical Position: Posterior Station: Ballotable Presentation: Vertex Exam by:: Fatima Blank, CNM   REPEAT CERVICAL EXAM (  FHT:  Baseline 140 , moderate variability, accelerations present, no decelerations Contractions: q 4-10 mins   Labs: Results for orders placed or performed during the hospital encounter of 12/07/19 (from the past 24 hour(s))  Urinalysis, Routine w reflex microscopic     Status: None   Collection Time: 12/07/19  2:14 PM  Result Value Ref Range   Color, Urine YELLOW YELLOW   APPearance CLEAR CLEAR   Specific Gravity, Urine 1.019 1.005 - 1.030   pH 8.0 5.0 - 8.0   Glucose, UA NEGATIVE NEGATIVE mg/dL   Hgb urine dipstick NEGATIVE NEGATIVE   Bilirubin Urine NEGATIVE NEGATIVE   Ketones, ur NEGATIVE NEGATIVE mg/dL   Protein, ur NEGATIVE NEGATIVE mg/dL   Nitrite NEGATIVE NEGATIVE    Leukocytes,Ua NEGATIVE NEGATIVE  Wet prep, genital     Status: Abnormal   Collection Time: 12/07/19  2:59 PM   Specimen: PATH Cytology Cervicovaginal Ancillary Only; Genital  Result Value Ref Range   Yeast Wet Prep HPF POC NONE SEEN NONE SEEN   Trich, Wet Prep NONE SEEN NONE SEEN   Clue Cells Wet Prep HPF POC NONE SEEN NONE SEEN   WBC, Wet Prep HPF POC MANY (A) NONE SEEN   Sperm NONE SEEN    B/Positive/-- (08/13 1437)  Imaging:  [place] MFM OB FOLLOW UP  Result Date: 11/14/2019 ----------------------------------------------------------------------  OBSTETRICS REPORT                       (  Signed Final 11/14/2019 03:57 pm) ---------------------------------------------------------------------- Patient Info  ID #:       [id number]                          D.O.B.:  [birth year] (23 yrs)  Name:       Catherine Collins                Visit Date: 11/14/2019 11:47 am ---------------------------------------------------------------------- Performed By  Performed By:     Georgie Chard        Ref. Address:     [address] [place] [address] [place]                                                             [place]  Attending:        Sander Nephew      [facility]  Referred By:      [facility] ---------------------------------------------------------------------- Orders   #  Description                          Code         Ordered By   1  [place] MFM OB FOLLOW UP                  10258.52     [place]  ----------------------------------------------------------------------   #  Order #                    Accession #                 Episode #   1  [id number]  [id number]                  [id number]   ---------------------------------------------------------------------- Indications   Encounter for other antenatal screening        Z36.2   follow-up (low risk NIPS)   Poor obstetric history: Previous fetal growth  O09.299   restriction (FGR)   Alcohol use complicating pregnancy, first      O99.311   trimester   [redacted] weeks gestation of pregnancy                Z3A.28  ---------------------------------------------------------------------- Fetal Evaluation  Num Of Fetuses:         1  Fetal Heart Rate(bpm):  150  Cardiac Activity:       Observed  Presentation:           Cephalic  Placenta:               Anterior  P. Cord Insertion:      Visualized  Amniotic Fluid  AFI FV:      Within normal limits  AFI Sum(cm)     %Tile       Largest Pocket(cm)  16.76           62          6.98  RUQ(cm)       RLQ(cm)       LUQ(cm)        LLQ(cm)  6.98          0             4.2            5.58 ---------------------------------------------------------------------- Biometry  BPD:      69.6  mm     G. Age:  28w 0d         24  %    CI:         76.4   %    70 - 86                                                          FL/HC:      21.4   %    18.8 - 20.6  HC:      252.3  mm     G. Age:  27w 3d          4  %    HC/AC:      1.04        1.05 - 1.21  AC:      241.5  mm     G. Age:  28w 3d         42  %    FL/BPD:     77.7   %    71 - 87  FL:       54.1  mm     G. Age:  28w 4d         41  %    FL/AC:      22.4   %    20 - 24  HUM:      49.9  mm     G. Age:  29w 2d         62  %  LV:        2.9  mm  Est. FW:    1215  gm    2 lb 11 oz      34  % ---------------------------------------------------------------------- OB History  Gravidity:    2         Term:   1        Prem:   0        SAB:   0  TOP:          0       Ectopic:  0        Living: 1 ---------------------------------------------------------------------- Gestational Age  LMP:           28w 2d        Date:  04/30/19                 EDD:   02/04/20  U/S Today:     28w 1d                                         EDD:   02/05/20  Best:          28w 3d     Det. ByLoman Chroman         EDD:   02/03/20                                      (07/13/19) ---------------------------------------------------------------------- Anatomy  Cranium:               Appears normal         LVOT:                   Previously seen  Cavum:                 Appears normal         Aortic Arch:            Previously seen  Ventricles:            Appears normal         Ductal Arch:            Previously seen  Choroid Plexus:        Previously seen        Diaphragm:              Appears normal  Cerebellum:            Previously seen        Stomach:                Appears normal, left                                                                        sided  Posterior Fossa:       Previously seen        Abdomen:                Appears normal  Nuchal Fold:           Previously seen  Abdominal Wall:         Previously seen  Face:                  Orbits and profile     Cord Vessels:           Previously seen                         previously seen  Lips:                  Previously seen        Kidneys:                Appear normal  Palate:                Previously seen        Bladder:                Appears normal  Thoracic:              Appears normal         Spine:                  Previously seen                         Appears normal  Heart:                 Previously seen        Upper Extremities:      Previously seen  RVOT:                  Previously seen        Lower Extremities:      Previously seen  Other:  Fetus appears to be a female prev seen. Heels and 5th digit prev          visualized. Technically difficult due to fetal position. ---------------------------------------------------------------------- Impression  Normal interval growth, good fetal movement and amniotic  fluid.  Prior history of growth restriction. ---------------------------------------------------------------------- Recommendations  Follow up  growth in 4 weeks given history of growth  restriction. ----------------------------------------------------------------------               Sander Nephew, MD Electronically Signed Final Report   11/14/2019 03:57 pm ----------------------------------------------------------------------  MAU Course/MDM: Orders Placed This Encounter  Procedures  . Wet prep, genital  . Urinalysis, Routine w reflex microscopic  . Discharge patient    Meds ordered this encounter  Medications  . lactated ringers bolus 1,000 mL  . NIFEdipine (PROCARDIA) capsule 10 mg  . betamethasone acetate-betamethasone sodium phosphate (CELESTONE) injection 12 mg  . cefTRIAXone (ROCEPHIN) injection 500 mg    Order Specific Question:   Antibiotic Indication:    Answer:   STD  . lidocaine (PF) (XYLOCAINE) 1 % injection 5 mL    For IM administration of Rocephin    Assessment:  Royce Sciara is a 24 yo G2P1001 at 40w5dpresenting for lower back pain, abdominal cramping and vaginal bleeding. Given cervical dilation in context of bleeding and abdominal cramping, concern for potential preterm labor vs uteral irritation secondary to STI. Symptoms improved with procardia x3 and fluids and repeat cervical exam did not show progression of dilation or effacement, therefore patient was d/c home with scheduled appointment tomorrow for second dose of betamethasone. Patient was treated for possible gonorrhea infection, and given prescription for expedited partner therapy.  1. Threatened preterm labor, third trimester   2. Gonorrhea affecting pregnancy, antepartum, third trimester    Plan: Return tomorrow for 2nd betamethasone injection  Discharge home with 800 mg cefixime, single oral dose for treatment of partner  Strict precautions to return should symptoms return and/or worsen   Follow-up Information    [facility] up.   Why: As scheduled. Return to MAU as needed for emergencies.  Contact  information: [place] [address] [phone]         Allergies as of 12/07/2019   No Known Allergies     Medication List    STOP taking these medications   terconazole 0.4 % vaginal cream Commonly known as: TERAZOL 7     TAKE these medications   acetaminophen 325 MG tablet Commonly known as: TYLENOL Take 650 mg by mouth every 6 (six) hours as needed.   Blood Pressure Monitor Kit 1 kit by Does not apply route once a week. Check BP weekly. Large Cuff DX: Z34.6   metroNIDAZOLE 500 MG tablet Commonly known as: FLAGYL Take 1 tablet (500 mg total) by mouth 2 (two) times daily.   OB Complete Petite 35-5-1-200 MG Caps Take 1 tablet by mouth daily.   ondansetron 4 MG tablet Commonly known as: ZOFRAN Take 4 mg by mouth 3 (three) times daily as needed.      Chriss Driver, MS3   12/07/2019 6:15 PM

## 2019-12-08 ENCOUNTER — Inpatient Hospital Stay (HOSPITAL_COMMUNITY)
Admission: AD | Admit: 2019-12-08 | Discharge: 2019-12-08 | Disposition: A | Discharge status: Home / Self Care | Payer: Medicaid Other | Attending: Family Medicine | Admitting: Family Medicine

## 2019-12-08 ENCOUNTER — Other Ambulatory Visit: Payer: Self-pay

## 2019-12-08 DIAGNOSIS — Z87891 Personal history of nicotine dependence: Secondary | ICD-10-CM | POA: Insufficient documentation

## 2019-12-08 DIAGNOSIS — Z3689 Encounter for other specified antenatal screening: Secondary | ICD-10-CM | POA: Diagnosis not present

## 2019-12-08 DIAGNOSIS — Z79899 Other long term (current) drug therapy: Secondary | ICD-10-CM | POA: Insufficient documentation

## 2019-12-08 DIAGNOSIS — D649 Anemia, unspecified: Secondary | ICD-10-CM | POA: Diagnosis not present

## 2019-12-08 DIAGNOSIS — O368130 Decreased fetal movements, third trimester, not applicable or unspecified: Secondary | ICD-10-CM | POA: Diagnosis present

## 2019-12-08 DIAGNOSIS — Z3A31 31 weeks gestation of pregnancy: Secondary | ICD-10-CM | POA: Diagnosis not present

## 2019-12-08 DIAGNOSIS — O99013 Anemia complicating pregnancy, third trimester: Secondary | ICD-10-CM | POA: Diagnosis not present

## 2019-12-08 LAB — GC/CHLAMYDIA PROBE AMP (~~LOC~~) NOT AT ARMC
Chlamydia: NEGATIVE
Comment: NEGATIVE
Comment: NORMAL
Neisseria Gonorrhea: NEGATIVE

## 2019-12-08 MED ORDER — BETAMETHASONE SOD PHOS & ACET 6 (3-3) MG/ML IJ SUSP
12.0000 mg | Freq: Once | INTRAMUSCULAR | Status: AC
  Administered 2019-12-08: 12 mg via INTRAMUSCULAR

## 2019-12-08 NOTE — MAU Provider Note (Signed)
History   [id number]   Chief Complaint  Patient presents with  . Injections  . Decreased Fetal Movement    HPI Catherine Collins is a 24 y.o. female  G2P1001 here with report of decreased fetal movement since 4 days ago.  Reports not feeling the baby move since last night. Did feel some movement when the nurse listened to heart tones in triage. Patient presented for her second dose of betamethasone. Was seen in MAU yesterday for preterm contractions. Cervix dilated 2 cm & remained unchanged with prolonged monitoring. Reports some contractions this morning. No contractions since then. Denies vaginal bleeding or leaking of fluid.   Patient's last menstrual period was 03/02/2019.  OB History  Gravida Para Term Preterm AB Living  '2 1 1     1  ' SAB TAB Ectopic Multiple Live Births        0 1    # Outcome Date GA Lbr Len/2nd Weight Sex Delivery Anes PTL Lv  2 Current           1 Term 09/14/18 85w1d01:30 / 01:23 2810 g F Vag-Spont EPI  LIV    Past Medical History:  Diagnosis Date  . Anemia   . Anxiety   . Depression   . Mental disorder     Family History  Problem Relation Age of Onset  . Drug abuse Maternal Grandfather   . Diabetes Maternal Grandfather   . Hypertension Maternal Grandfather   . Vision loss Maternal Grandfather   . Cancer Maternal Grandmother   . COPD Maternal Grandmother     Social History   Socioeconomic History  . Marital status: Single    Spouse name: Not on file  . Number of children: 1  . Years of education: Not on file  . Highest education level: 10th grade  Occupational History  . Not on file  Tobacco Use  . Smoking status: Former Smoker    Packs/day: 0.50    Types: Cigarettes    Quit date: 02/11/2018    Years since quitting: 1.8  . Smokeless tobacco: Never Used  . Tobacco comment: stopped smoking after preg confirm  Substance and Sexual Activity  . Alcohol use: Not Currently    Alcohol/week: 0.0 standard drinks  . Drug use: No  . Sexual  activity: Yes    Birth control/protection: None  Other Topics Concern  . Not on file  Social History Narrative   Lives with child   Social Determinants of Health   Financial Resource Strain:   . Difficulty of Paying Living Expenses: Not on file  Food Insecurity:   . Worried About RCharity fundraiserin the Last Year: Not on file  . Ran Out of Food in the Last Year: Not on file  Transportation Needs:   . Lack of Transportation (Medical): Not on file  . Lack of Transportation (Non-Medical): Not on file  Physical Activity:   . Days of Exercise per Week: Not on file  . Minutes of Exercise per Session: Not on file  Stress:   . Feeling of Stress : Not on file  Social Connections:   . Frequency of Communication with Friends and Family: Not on file  . Frequency of Social Gatherings with Friends and Family: Not on file  . Attends Religious Services: Not on file  . Active Member of Clubs or Organizations: Not on file  . Attends CArchivistMeetings: Not on file  . Marital Status: Not on file  No Known Allergies  No current facility-administered medications on file prior to encounter.   Current Outpatient Medications on File Prior to Encounter  Medication Sig Dispense Refill  . acetaminophen (TYLENOL) 325 MG tablet Take 650 mg by mouth every 6 (six) hours as needed.    . Blood Pressure Monitor KIT 1 kit by Does not apply route once a week. Check BP weekly. Large Cuff DX: Z34.6 1 kit 0  . metroNIDAZOLE (FLAGYL) 500 MG tablet Take 1 tablet (500 mg total) by mouth 2 (two) times daily. (Patient not taking: Reported on 11/28/2019) 14 tablet 2  . ondansetron (ZOFRAN) 4 MG tablet Take 4 mg by mouth 3 (three) times daily as needed.    . Prenat-FeCbn-FeAspGl-FA-Omega (OB COMPLETE PETITE) 35-5-1-200 MG CAPS Take 1 tablet by mouth daily. 30 capsule 12     Review of Systems  Constitutional: Negative.   Gastrointestinal: Negative.   Genitourinary: Negative.    Physical Exam    Vitals:   12/08/19 1504 12/08/19 1553  BP: 125/61 114/67  Pulse: (!) 110 91  Resp: 18 16  Temp: 98.6 F (37 C)     Physical Exam  Nursing note and vitals reviewed. Constitutional: She appears well-developed and well-nourished. No distress.  Respiratory: Effort normal. No respiratory distress.  Skin: She is not diaphoretic.  Psychiatric: She has a normal mood and affect. Her behavior is normal. Judgment and thought content normal.   NST:  Baseline: 150 bpm, Variability: Good {> 6 bpm), Accelerations: Reactive and Decelerations: Absent  MAU Course  Procedures  MDM Reactive NST with active movement  Received BMZ without complication  Reports fetal movement since being in MAU  Assessment and Plan  A: 1. Decreased fetal movements in third trimester, single or unspecified fetus   2. NST (non-stress test) reactive   3. [redacted] weeks gestation of pregnancy    P: Discharge home Discussed fetal movement count Discussed PTL signs & reasons to return to MAU Keep appt at [facility] on Monday   Jorje Guild, NP 12/08/2019 4:00 PM

## 2019-12-08 NOTE — MAU Note (Signed)
Pt reports not feeling baby move much since last night. Brought to room 121 for NST.

## 2019-12-08 NOTE — Discharge Instructions (Signed)
Fetal Movement Counts Patient Name: ________________________________________________ Patient Due Date: ____________________ What is a fetal movement count?  A fetal movement count is the number of times that you feel your baby move during a certain amount of time. This may also be called a fetal kick count. A fetal movement count is recommended for every pregnant woman. You may be asked to start counting fetal movements as early as week 28 of your pregnancy. Pay attention to when your baby is most active. You may notice your baby's sleep and wake cycles. You may also notice things that make your baby move more. You should do a fetal movement count:  When your baby is normally most active.  At the same time each day. A good time to count movements is while you are resting, after having something to eat and drink. How do I count fetal movements? 1. Find a quiet, comfortable area. Sit, or lie down on your side. 2. Write down the date, the start time and stop time, and the number of movements that you felt between those two times. Take this information with you to your health care visits. 3. Write down your start time when you feel the first movement. 4. Count kicks, flutters, swishes, rolls, and jabs. You should feel at least 10 movements. 5. You may stop counting after you have felt 10 movements, or if you have been counting for 2 hours. Write down the stop time. 6. If you do not feel 10 movements in 2 hours, contact your health care provider for further instructions. Your health care provider may want to do additional tests to assess your baby's well-being. Contact a health care provider if:  You feel fewer than 10 movements in 2 hours.  Your baby is not moving like he or she usually does. Date: ____________ Start time: ____________ Stop time: ____________ Movements: ____________ Date: ____________ Start time: ____________ Stop time: ____________ Movements: ____________ Date: ____________  Start time: ____________ Stop time: ____________ Movements: ____________ Date: ____________ Start time: ____________ Stop time: ____________ Movements: ____________ Date: ____________ Start time: ____________ Stop time: ____________ Movements: ____________ Date: ____________ Start time: ____________ Stop time: ____________ Movements: ____________ Date: ____________ Start time: ____________ Stop time: ____________ Movements: ____________ Date: ____________ Start time: ____________ Stop time: ____________ Movements: ____________ Date: ____________ Start time: ____________ Stop time: ____________ Movements: ____________ This information is not intended to replace advice given to you by your health care provider. Make sure you discuss any questions you have with your health care provider. Document Revised: 07/06/2019 Document Reviewed: 07/06/2019 Elsevier Patient Education  2020 [employer] Braxton Hicks Contractions Contractions of the uterus can occur throughout pregnancy, but they are not always a sign that you are in labor. You may have practice contractions called Braxton Hicks contractions. These false labor contractions are sometimes confused with true labor. What are Braxton Hicks contractions? Braxton Hicks contractions are tightening movements that occur in the muscles of the uterus before labor. Unlike true labor contractions, these contractions do not result in opening (dilation) and thinning of the cervix. Toward the end of pregnancy (32-34 weeks), Braxton Hicks contractions can happen more often and may become stronger. These contractions are sometimes difficult to tell apart from true labor because they can be very uncomfortable. You should not feel embarrassed if you go to the hospital with false labor. Sometimes, the only way to tell if you are in true labor is for your health care provider to look for changes in the cervix. The health care provider   will do a physical exam and may  monitor your contractions. If you are not in true labor, the exam should show that your cervix is not dilating and your water has not broken. If there are no other health problems associated with your pregnancy, it is completely safe for you to be sent home with false labor. You may continue to have Braxton Hicks contractions until you go into true labor. How to tell the difference between true labor and false labor True labor  Contractions last 30-70 seconds.  Contractions become very regular.  Discomfort is usually felt in the top of the uterus, and it spreads to the lower abdomen and low back.  Contractions do not go away with walking.  Contractions usually become more intense and increase in frequency.  The cervix dilates and gets thinner. False labor  Contractions are usually shorter and not as strong as true labor contractions.  Contractions are usually irregular.  Contractions are often felt in the front of the lower abdomen and in the groin.  Contractions may go away when you walk around or change positions while lying down.  Contractions get weaker and are shorter-lasting as time goes on.  The cervix usually does not dilate or become thin. Follow these instructions at home:   Take over-the-counter and prescription medicines only as told by your health care provider.  Keep up with your usual exercises and follow other instructions from your health care provider.  Eat and drink lightly if you think you are going into labor.  If Braxton Hicks contractions are making you uncomfortable: ? Change your position from lying down or resting to walking, or change from walking to resting. ? Sit and rest in a tub of warm water. ? Drink enough fluid to keep your urine pale yellow. Dehydration may cause these contractions. ? Do slow and deep breathing several times an hour.  Keep all follow-up prenatal visits as told by your health care provider. This is important. Contact a  health care provider if:  You have a fever.  You have continuous pain in your abdomen. Get help right away if:  Your contractions become stronger, more regular, and closer together.  You have fluid leaking or gushing from your vagina.  You pass blood-tinged mucus (bloody show).  You have bleeding from your vagina.  You have low back pain that you never had before.  You feel your baby's head pushing down and causing pelvic pressure.  Your baby is not moving inside you as much as it used to. Summary  Contractions that occur before labor are called Braxton Hicks contractions, false labor, or practice contractions.  Braxton Hicks contractions are usually shorter, weaker, farther apart, and less regular than true labor contractions. True labor contractions usually become progressively stronger and regular, and they become more frequent.  Manage discomfort from Braxton Hicks contractions by changing position, resting in a warm bath, drinking plenty of water, or practicing deep breathing. This information is not intended to replace advice given to you by your health care provider. Make sure you discuss any questions you have with your health care provider. Document Revised: 10/29/2017 Document Reviewed: 04/01/2017 Elsevier Patient Education  2020 Elsevier Inc.  

## 2019-12-08 NOTE — MAU Note (Signed)
Pt here for second dose of BTX. Stated she is still feeling ctx a few an hour. They were stronger this morning but not some much now.

## 2019-12-11 ENCOUNTER — Ambulatory Visit (INDEPENDENT_AMBULATORY_CARE_PROVIDER_SITE_OTHER): Payer: Medicaid Other | Admitting: Obstetrics and Gynecology

## 2019-12-11 ENCOUNTER — Encounter: Payer: Self-pay | Admitting: Obstetrics and Gynecology

## 2019-12-11 ENCOUNTER — Other Ambulatory Visit: Payer: Self-pay

## 2019-12-11 VITALS — BP 102/65 | HR 102 | Wt 146.9 lb

## 2019-12-11 DIAGNOSIS — Z3A32 32 weeks gestation of pregnancy: Secondary | ICD-10-CM

## 2019-12-11 DIAGNOSIS — O98212 Gonorrhea complicating pregnancy, second trimester: Secondary | ICD-10-CM

## 2019-12-11 DIAGNOSIS — Z8759 Personal history of other complications of pregnancy, childbirth and the puerperium: Secondary | ICD-10-CM

## 2019-12-11 DIAGNOSIS — Z3480 Encounter for supervision of other normal pregnancy, unspecified trimester: Secondary | ICD-10-CM

## 2019-12-11 NOTE — Progress Notes (Signed)
Patient reports not picking up b/p monitor.

## 2019-12-11 NOTE — Progress Notes (Signed)
Patient c/o  Itching at injection site.

## 2019-12-11 NOTE — Progress Notes (Signed)
   PRENATAL VISIT NOTE  Subjective:  Catherine Collins is a 24 y.o. G2P1001 at [redacted]w[redacted]d being seen today for ongoing prenatal care.  She is currently monitored for the following issues for this low-risk pregnancy and has History of depression; Late prenatal care; Gonorrhea affecting pregnancy in second trimester; History of prior pregnancy with IUGR newborn; History of tobacco use; and Supervision of other normal pregnancy, antepartum on their problem list.  Patient reports no complaints.   .  .  Movement: Present. Denies leaking of fluid.   The following portions of the patient's history were reviewed and updated as appropriate: allergies, current medications, past family history, past medical history, past social history, past surgical history and problem list.   Objective:   Vitals:   12/11/19 1338  BP: 102/65  Pulse: (!) 102  Weight: 146 lb 14.4 oz (66.6 kg)    Fetal Status: Fetal Heart Rate (bpm): 142 Fundal Height: 31 cm Movement: Present     General:  Alert, oriented and cooperative. Patient is in no acute distress.  Skin: Skin is warm and dry. No rash noted.   Cardiovascular: Normal heart rate noted  Respiratory: Normal respiratory effort, no problems with respiration noted  Abdomen: Soft, gravid, appropriate for gestational age.  Pain/Pressure: Absent     Pelvic: Cervical exam deferred        Extremities: Normal range of motion.  Edema: None  Mental Status: Normal mood and affect. Normal behavior. Normal judgment and thought content.   Assessment and Plan:  Pregnancy: G2P1001 at [redacted]w[redacted]d 1. Supervision of other normal pregnancy, antepartum Patient is doing well without complaints Patient is not interested in contraception at this time. LARCs discussed  2. History of prior pregnancy with IUGR newborn Follow up growth ultrasound 12/12/19  3. Gonorrhea affecting pregnancy in second trimester Negative TOC 12/07/19. Patient was also retreated on that day  Preterm labor symptoms  and general obstetric precautions including but not limited to vaginal bleeding, contractions, leaking of fluid and fetal movement were reviewed in detail with the patient. Please refer to After Visit Summary for other counseling recommendations.   Return in about 2 weeks (around 12/25/2019) for Virtual, ROB, Low risk.  Future Appointments  Date Time Provider Department Center  12/12/2019 12:45 PM [facility] NURSE [facility]  12/12/2019 12:45 PM [facility] [place] 2 [facility]  12/25/2019  2:30 PM Brock Bad, MD [facility] None  03/06/2020  2:00 PM Anson Fret, MD [facility] None    Catalina Antigua, MD

## 2019-12-12 ENCOUNTER — Encounter (HOSPITAL_COMMUNITY): Payer: Self-pay

## 2019-12-12 ENCOUNTER — Ambulatory Visit (HOSPITAL_COMMUNITY): Payer: Medicaid Other | Admitting: *Deleted

## 2019-12-12 ENCOUNTER — Other Ambulatory Visit (HOSPITAL_COMMUNITY): Payer: Self-pay | Admitting: *Deleted

## 2019-12-12 ENCOUNTER — Ambulatory Visit (HOSPITAL_COMMUNITY)
Admission: RE | Admit: 2019-12-12 | Discharge: 2019-12-12 | Disposition: A | Discharge status: Home / Self Care | Payer: Medicaid Other | Source: Ambulatory Visit | Attending: Maternal & Fetal Medicine | Admitting: Maternal & Fetal Medicine

## 2019-12-12 VITALS — BP 120/57 | HR 97 | Temp 97.8°F

## 2019-12-12 DIAGNOSIS — O0990 Supervision of high risk pregnancy, unspecified, unspecified trimester: Secondary | ICD-10-CM | POA: Diagnosis present

## 2019-12-12 DIAGNOSIS — O09293 Supervision of pregnancy with other poor reproductive or obstetric history, third trimester: Secondary | ICD-10-CM

## 2019-12-12 DIAGNOSIS — Z3A32 32 weeks gestation of pregnancy: Secondary | ICD-10-CM

## 2019-12-12 DIAGNOSIS — O99313 Alcohol use complicating pregnancy, third trimester: Secondary | ICD-10-CM

## 2019-12-12 DIAGNOSIS — Z362 Encounter for other antenatal screening follow-up: Secondary | ICD-10-CM | POA: Diagnosis present

## 2019-12-12 DIAGNOSIS — O4103X0 Oligohydramnios, third trimester, not applicable or unspecified: Secondary | ICD-10-CM

## 2019-12-25 ENCOUNTER — Telehealth (INDEPENDENT_AMBULATORY_CARE_PROVIDER_SITE_OTHER): Payer: Medicaid Other | Admitting: Obstetrics

## 2019-12-25 ENCOUNTER — Encounter: Payer: Self-pay | Admitting: Obstetrics

## 2019-12-25 VITALS — BP 127/77 | HR 98

## 2019-12-25 DIAGNOSIS — O0990 Supervision of high risk pregnancy, unspecified, unspecified trimester: Secondary | ICD-10-CM

## 2019-12-25 DIAGNOSIS — O365990 Maternal care for other known or suspected poor fetal growth, unspecified trimester, not applicable or unspecified: Secondary | ICD-10-CM

## 2019-12-25 DIAGNOSIS — Z3A39 39 weeks gestation of pregnancy: Secondary | ICD-10-CM

## 2019-12-25 DIAGNOSIS — M549 Dorsalgia, unspecified: Secondary | ICD-10-CM

## 2019-12-25 DIAGNOSIS — Z8759 Personal history of other complications of pregnancy, childbirth and the puerperium: Secondary | ICD-10-CM

## 2019-12-25 MED ORDER — COMFORT FIT MATERNITY SUPP SM MISC: 0 refills | Status: DC

## 2019-12-25 NOTE — Progress Notes (Signed)
TELEHEALTH OBSTETRICS PRENATAL VIRTUAL VIDEO VISIT ENCOUNTER NOTE  [facility]   I connected with Catherine BimlerMakayla E Sheller on 12/25/19 at  2:30 PM EST by [facility] Encounter at home and verified that I am speaking with the correct person using two identifiers.   I discussed the limitations, risks, security and privacy concerns of performing an evaluation and management service virtually and the availability of in person appointments. I also discussed with the patient that there may be a patient responsible charge related to this service. The patient expressed understanding and agreed to proceed. Subjective:  Catherine BimlerMakayla E Collins is a 24 y.o. G2P1001 at 4172w2d being seen today for ongoing prenatal care.  She is currently monitored for the following issues for this high-risk pregnancy and has History of depression; Late prenatal care; Gonorrhea affecting pregnancy in second trimester; History of prior pregnancy with IUGR newborn; History of tobacco use; and Supervision of other normal pregnancy, antepartum on their problem list.  Patient reports backache.  Contractions: Irregular. Vag. Bleeding: None.  Movement: Present. Denies any leaking of fluid.   The following portions of the patient's history were reviewed and updated as appropriate: allergies, current medications, past family history, past medical history, past social history, past surgical history and problem list.   Objective:   Vitals:   12/25/19 1412  BP: 127/77  Pulse: 98    Fetal Status:     Movement: Present     General:  Alert, oriented and cooperative. Patient is in no acute distress.  Respiratory: Normal respiratory effort, no problems with respiration noted  Mental Status: Normal mood and affect. Normal behavior. Normal judgment and thought content.  Rest of physical exam deferred due to type of encounter  Imaging: US MFM OB FOLLOW UP  Result Date:  12/12/2019 ----------------------------------------------------------------------  OBSTETRICS REPORT                       (Signed Final 12/12/2019 02:45 pm) ---------------------------------------------------------------------- Patient Info  ID #:       [id number]                          D.O.B.:  13-Nov-1996 (23 yrs)  Name:       Catherine BimlerMAKAYLA E Collins                Visit Date: 12/12/2019 12:53 pm ---------------------------------------------------------------------- Performed By  Performed By:     Lenise ArenaHannah Bazemore        Ref. Address:     [address]                    [address]                                                             [address]                                                             [phone]  [place]                                                             [address]  Attending:        Ma Rings MD         [facility]  Referred By:      [facility] ---------------------------------------------------------------------- Orders   #  Description                          Code         Ordered By   1  [place] MFM OB FOLLOW UP                  72536.64     Lin Landsman  ----------------------------------------------------------------------   #  Order #                    Accession #                 Episode #   1  [id number]                  [id number]                  [id number]  ---------------------------------------------------------------------- Indications   Poor obstetric history: Previous fetal growth  O09.299   restriction (FGR)   Encounter for other antenatal screening        Z36.2   follow-up (low risk NIPS)   Alcohol use complicating pregnancy, first      O99.311   trimester   [redacted] weeks gestation of pregnancy                Z3A.32   ---------------------------------------------------------------------- Vital Signs  Weight (lb): 146                               Height:        5'2"  BMI:         26.7 ---------------------------------------------------------------------- Fetal Evaluation  Num Of Fetuses:         1  Fetal Heart Rate(bpm):  135  Cardiac Activity:       Observed  Presentation:           Cephalic  Placenta:               Anterior  P. Cord Insertion:      Previously Visualized  Amniotic Fluid  AFI  FV:      Subjectively decreased  AFI Sum(cm)     %Tile       Largest Pocket(cm)  8.36            5           4.58  RUQ(cm)       RLQ(cm)       LUQ(cm)        LLQ(cm)  4.58          3.78          0              0 ---------------------------------------------------------------------- Biometry  BPD:        76  mm     G. Age:  30w 3d          4  %    CI:        78.41   %    70 - 86                                                          FL/HC:      22.7   %    19.1 - 21.3  HC:      271.5  mm     G. Age:  29w 4d        < 1  %    HC/AC:      0.99        0.96 - 1.17  AC:      274.7  mm     G. Age:  31w 4d         24  %    FL/BPD:     80.9   %    71 - 87  FL:       61.5  mm     G. Age:  31w 6d         24  %    FL/AC:      22.4   %    20 - 24  Est. FW:    1752  gm    3 lb 14 oz      13  % ---------------------------------------------------------------------- OB History  Gravidity:    2         Term:   1        Prem:   0        SAB:   0  TOP:          0       Ectopic:  0        Living: 1 ---------------------------------------------------------------------- Gestational Age  LMP:           32w 2d        Date:  04/30/19                 EDD:   02/04/20  U/S Today:     30w 6d                                        EDD:   02/14/20  Best:          Armida Sans 3d     Det.  ByMarcella Dubs         EDD:   02/03/20                                      (07/13/19) ---------------------------------------------------------------------- Anatomy  Cranium:                Appears normal         LVOT:                   Previously seen  Cavum:                 Appears normal         Aortic Arch:            Previously seen  Ventricles:            Appears normal         Ductal Arch:            Previously seen  Choroid Plexus:        Previously seen        Diaphragm:              Appears normal  Cerebellum:            Previously seen        Stomach:                Appears normal, left                                                                        sided  Posterior Fossa:       Previously seen        Abdomen:                Appears normal  Nuchal Fold:           Previously seen        Abdominal Wall:         Previously seen  Face:                  Orbits and profile     Cord Vessels:           Previously seen                         previously seen  Lips:                  Previously seen        Kidneys:                Appear normal  Palate:                Previously seen        Bladder:                Appears normal  Thoracic:              Appears normal         Spine:  Previously seen                         Appears normal  Heart:                 Previously seen        Upper Extremities:      Previously seen  RVOT:                  Previously seen        Lower Extremities:      Previously seen  Other:  Fetus appears to be a female prev seen. Heels and 5th digit prev          visualized. Technically difficult due to fetal position. ---------------------------------------------------------------------- Cervix Uterus Adnexa  Cervix  Not visualized (advanced GA >24wks) ---------------------------------------------------------------------- Comments  This patient was seen for a follow up growth scan due to a  prior pregnancy that was complicated by a growth restricted  fetus.  She denies any problems since her last exam.  She was informed that the fetal growth measures in the lower  normal range today.  The amniotic fluid level was also in the  lower normal range today.  Due  to her history and today's ultrasound findings, a follow-up  exam was scheduled in 3 weeks.  Should fetal growth  restriction be noted at her next exam, we will continue to  follow her with weekly fetal testing and weekly umbilical artery  Doppler studies. ----------------------------------------------------------------------                   Johnell Comings, MD Electronically Signed Final Report   12/12/2019 02:45 pm ----------------------------------------------------------------------   Assessment and Plan:  Pregnancy: G2P1001 at [redacted]w[redacted]d 1. Supervision of high risk pregnancy, antepartum  2. History of prior pregnancy with IUGR newborn  3. Poor fetal growth affecting management of mother, antepartum, single or unspecified fetus - fetal growth and AFI are both low normal - follow up fetal assessment for growth and AFI scheduled by MFM in 3 weeks, along with weekly fetal testing and weekly UA Dopplers.  4. Backache symptom Rx: - Elastic Bandages & Supports (COMFORT FIT MATERNITY SUPP SM) MISC; Wear as directed.  Dispense: 1 each; Refill: 0   Preterm labor symptoms and general obstetric precautions including but not limited to vaginal bleeding, contractions, leaking of fluid and fetal movement were reviewed in detail with the patient. I discussed the assessment and treatment plan with the patient. The patient was provided an opportunity to ask questions and all were answered. The patient agreed with the plan and demonstrated an understanding of the instructions. The patient was advised to call back or seek an in-person office evaluation/go to [facility]U at [facility] for any urgent or concerning symptoms. Please refer to After Visit Summary for other counseling recommendations.   I provided 10 minutes of face-to-face time during this encounter.  Return in about 2 weeks (around 01/08/2020) for [facility].  Faculty only..  Future Appointments  Date Time Provider [place]  12/25/2019   2:30 PM Shelly Bombard, MD [place] None  01/02/2020 12:30 PM [place] [facility]  01/02/2020 12:30 PM [place] 1 [facility]  03/06/2020  2:00 PM Melvenia Beam, MD [facility] None    Baltazar Najjar, [place] [facility] 12/25/2019

## 2019-12-26 ENCOUNTER — Other Ambulatory Visit: Payer: Self-pay

## 2019-12-26 ENCOUNTER — Inpatient Hospital Stay (HOSPITAL_COMMUNITY)
Admission: AD | Admit: 2019-12-26 | Discharge: 2019-12-27 | Disposition: A | Discharge status: Home / Self Care | Payer: Medicaid Other | Attending: Family Medicine | Admitting: Family Medicine

## 2019-12-26 ENCOUNTER — Encounter (HOSPITAL_COMMUNITY): Payer: Self-pay | Admitting: Family Medicine

## 2019-12-26 DIAGNOSIS — Z888 Allergy status to other drugs, medicaments and biological substances status: Secondary | ICD-10-CM | POA: Diagnosis not present

## 2019-12-26 DIAGNOSIS — Z3A34 34 weeks gestation of pregnancy: Secondary | ICD-10-CM | POA: Diagnosis not present

## 2019-12-26 DIAGNOSIS — Z809 Family history of malignant neoplasm, unspecified: Secondary | ICD-10-CM | POA: Diagnosis not present

## 2019-12-26 DIAGNOSIS — Z833 Family history of diabetes mellitus: Secondary | ICD-10-CM | POA: Insufficient documentation

## 2019-12-26 DIAGNOSIS — O4703 False labor before 37 completed weeks of gestation, third trimester: Secondary | ICD-10-CM | POA: Insufficient documentation

## 2019-12-26 DIAGNOSIS — Z87891 Personal history of nicotine dependence: Secondary | ICD-10-CM | POA: Insufficient documentation

## 2019-12-26 LAB — URINALYSIS, ROUTINE W REFLEX MICROSCOPIC
Bilirubin Urine: NEGATIVE
Glucose, UA: NEGATIVE mg/dL
Hgb urine dipstick: NEGATIVE
Ketones, ur: NEGATIVE mg/dL
Leukocytes,Ua: NEGATIVE
Nitrite: NEGATIVE
Protein, ur: 30 mg/dL — AB
Specific Gravity, Urine: 1.027 (ref 1.005–1.030)
pH: 5 (ref 5.0–8.0)

## 2019-12-26 MED ORDER — NIFEDIPINE 10 MG PO CAPS
10.0000 mg | ORAL_CAPSULE | ORAL | Status: AC | PRN
  Administered 2019-12-26 (×4): 10 mg via ORAL
  Filled 2019-12-26 (×4): qty 1

## 2019-12-26 NOTE — MAU Provider Note (Signed)
Chief Complaint:  Contractions   First Provider Initiated Contact with Patient 12/26/19 2217     HPI: Catherine Collins is a 24 y.o. G2P1001 at 66w3dho presents to maternity admissions reporting contractions since 7pm.  Was seen here 3 weeks ago for the same thing.  Cervix was found to be 2cm and betamethasone series was given along with tocolysis.   . She reports good fetal movement, denies LOF, vaginal bleeding, vaginal itching/burning, urinary symptoms, h/a, dizziness, n/v, diarrhea, constipation or fever/chills.    Abdominal Pain This is a recurrent problem. The current episode started 1 to 4 weeks ago. The onset quality is gradual. The problem occurs intermittently. The problem has been unchanged. The pain is located in the suprapubic region, LLQ and RLQ. The quality of the pain is cramping. The abdominal pain does not radiate. Pertinent negatives include no constipation, diarrhea, dysuria, fever, frequency, myalgias, nausea or vomiting. Nothing aggravates the pain. The pain is relieved by nothing. She has tried nothing for the symptoms.    RN Note: Pt states that she started having ctx's at 7 pm.  Denies vaginal bleeding or LOF.  Reports decreased fetal movement since 1300.   Past Medical History: Past Medical History:  Diagnosis Date  . Anemia   . Anxiety   . Depression   . Mental disorder     Past obstetric history: OB History  Gravida Para Term Preterm AB Living  '2 1 1     1  ' SAB TAB Ectopic Multiple Live Births        0 1    # Outcome Date GA Lbr Len/2nd Weight Sex Delivery Anes PTL Lv  2 Current           1 Term 09/14/18 375w1d1:30 / 01:23 2810 g F Vag-Spont EPI  LIV    Past Surgical History: Past Surgical History:  Procedure Laterality Date  . WISDOM TOOTH EXTRACTION      Family History: Family History  Problem Relation Age of Onset  . Drug abuse Maternal Grandfather   . Diabetes Maternal Grandfather   . Hypertension Maternal Grandfather   . Vision loss  Maternal Grandfather   . Cancer Maternal Grandmother   . COPD Maternal Grandmother     Social History: Social History   Tobacco Use  . Smoking status: Former Smoker    Packs/day: 0.50    Types: Cigarettes    Quit date: 02/11/2018    Years since quitting: 1.8  . Smokeless tobacco: Never Used  . Tobacco comment: stopped smoking after preg confirm  Substance Use Topics  . Alcohol use: Not Currently    Alcohol/week: 0.0 standard drinks  . Drug use: No    Allergies:  Allergies  Allergen Reactions  . Betamethasone Rash    Meds:  Medications Prior to Admission  Medication Sig Dispense Refill Last Dose  . acetaminophen (TYLENOL) 325 MG tablet Take 650 mg by mouth every 6 (six) hours as needed.   Past Month at Unknown time  . Blood Pressure Monitor KIT 1 kit by Does not apply route once a week. Check BP weekly. Large Cuff DX: Z34.6 1 kit 0 Past Week at Unknown time  . Prenat-FeCbn-FeAspGl-FA-Omega (OB COMPLETE PETITE) 35-5-1-200 MG CAPS Take 1 tablet by mouth daily. 30 capsule 12 12/26/2019 at Unknown time  . Elastic Bandages & Supports (COMFORT FIT MATERNITY SUPP SM) MISC Wear as directed. 1 each 0   . ondansetron (ZOFRAN) 4 MG tablet Take 4 mg by mouth 3 (three)  times daily as needed.       I have reviewed patient's Past Medical Hx, Surgical Hx, Family Hx, Social Hx, medications and allergies.   ROS:  Review of Systems  Constitutional: Negative for fever.  Gastrointestinal: Positive for abdominal pain. Negative for constipation, diarrhea, nausea and vomiting.  Genitourinary: Negative for dysuria and frequency.  Musculoskeletal: Negative for myalgias.   Other systems negative  Physical Exam   Patient Vitals for the past 24 hrs:  BP Temp Pulse Resp SpO2 Weight  12/26/19 2205 128/68 98.5 F (36.9 C) 92 18 97 % --  12/26/19 2159 -- -- -- -- -- 70 kg   Constitutional: Well-developed, well-nourished female in no acute distress.  Cardiovascular: normal rate and  rhythm Respiratory: normal effort, clear to auscultation bilaterally GI: Abd soft, non-tender, gravid appropriate for gestational age.   No rebound or guarding. MS: Extremities nontender, no edema, normal ROM Neurologic: Alert and oriented x 4.  GU: Neg CVAT.  PELVIC EXAM:    Dilation: 2 Effacement (%): 80 Cervical Position: Middle Station: -2 Presentation: Vertex Exam by:: Hansel Feinstein, CNM  FHT:  Baseline 145 , moderate variability, accelerations present, no decelerations Contractions: q 3 mins Irregular     Labs: Results for orders placed or performed during the hospital encounter of 12/26/19 (from the past 24 hour(s))  Urinalysis, Routine w reflex microscopic     Status: Abnormal   Collection Time: 12/26/19  9:58 PM  Result Value Ref Range   Color, Urine YELLOW YELLOW   APPearance HAZY (A) CLEAR   Specific Gravity, Urine 1.027 1.005 - 1.030   pH 5.0 5.0 - 8.0   Glucose, UA NEGATIVE NEGATIVE mg/dL   Hgb urine dipstick NEGATIVE NEGATIVE   Bilirubin Urine NEGATIVE NEGATIVE   Ketones, ur NEGATIVE NEGATIVE mg/dL   Protein, ur 30 (A) NEGATIVE mg/dL   Nitrite NEGATIVE NEGATIVE   Leukocytes,Ua NEGATIVE NEGATIVE   RBC / HPF 0-5 0 - 5 RBC/hpf   WBC, UA 0-5 0 - 5 WBC/hpf   Bacteria, UA RARE (A) NONE SEEN   Squamous Epithelial / LPF 0-5 0 - 5   Mucus PRESENT     B/Positive/-- (08/13 1437)  Imaging:   MAU Course/MDM: I have ordered labs and reviewed results.   UA is clear NST reviewed and has been reactive throughout time on unit.  Contractions were initially frequent but irregular at beginning. Received 4 doses of procardia with improvement but still had some irritability We pushed PO fluids and the irritability resolved Will give her some Procardia for PRN use at home until she nears term.    Assessment: Single intrauterine pregnancy at 67w4dPreterm uterine contractions, resolved S/P betamethasone series  Plan: Discharge home Preterm Labor precautions and fetal  kick counts Rx Procardia 171mq6 hrs prn more than 5 contractions per hour Follow up in Office for prenatal visits   Encouraged to return here or to other Urgent Care/ED if she develops worsening of symptoms, increase in pain, fever, or other concerning symptoms.   Pt stable at time of discharge.  MaHansel FeinsteinNM, MSN Certified Nurse-Midwife 12/26/2019 10:18 PM

## 2019-12-26 NOTE — MAU Note (Signed)
Pt states that she started having ctx's at 7 pm.   Denies vaginal bleeding or LOF.   Reports decreased fetal movement since 1300.

## 2019-12-27 DIAGNOSIS — Z3A34 34 weeks gestation of pregnancy: Secondary | ICD-10-CM

## 2019-12-27 DIAGNOSIS — O4703 False labor before 37 completed weeks of gestation, third trimester: Secondary | ICD-10-CM

## 2019-12-27 MED ORDER — NIFEDIPINE 10 MG PO CAPS: 10.0000 mg | ORAL_CAPSULE | Freq: Four times a day (QID) | ORAL | 0 refills | Status: DC | PRN

## 2019-12-27 NOTE — Discharge Instructions (Signed)

## 2019-12-28 ENCOUNTER — Other Ambulatory Visit: Payer: Self-pay

## 2019-12-28 DIAGNOSIS — M549 Dorsalgia, unspecified: Secondary | ICD-10-CM

## 2019-12-28 MED ORDER — COMFORT FIT MATERNITY SUPP SM MISC: 0 refills | Status: DC

## 2020-01-02 ENCOUNTER — Encounter (HOSPITAL_COMMUNITY): Payer: Self-pay | Admitting: *Deleted

## 2020-01-02 ENCOUNTER — Other Ambulatory Visit: Payer: Self-pay

## 2020-01-02 ENCOUNTER — Ambulatory Visit (HOSPITAL_COMMUNITY): Payer: Medicaid Other | Admitting: *Deleted

## 2020-01-02 ENCOUNTER — Ambulatory Visit (HOSPITAL_COMMUNITY)
Admission: RE | Admit: 2020-01-02 | Discharge: 2020-01-02 | Disposition: A | Discharge status: Home / Self Care | Payer: Medicaid Other | Source: Ambulatory Visit | Attending: Obstetrics and Gynecology | Admitting: Obstetrics and Gynecology

## 2020-01-02 VITALS — BP 128/70 | HR 86 | Temp 97.1°F

## 2020-01-02 DIAGNOSIS — Z362 Encounter for other antenatal screening follow-up: Secondary | ICD-10-CM | POA: Diagnosis not present

## 2020-01-02 DIAGNOSIS — Z8759 Personal history of other complications of pregnancy, childbirth and the puerperium: Secondary | ICD-10-CM

## 2020-01-02 DIAGNOSIS — O99313 Alcohol use complicating pregnancy, third trimester: Secondary | ICD-10-CM

## 2020-01-02 DIAGNOSIS — O4103X0 Oligohydramnios, third trimester, not applicable or unspecified: Secondary | ICD-10-CM | POA: Diagnosis not present

## 2020-01-02 DIAGNOSIS — Z3A35 35 weeks gestation of pregnancy: Secondary | ICD-10-CM

## 2020-01-02 DIAGNOSIS — O09293 Supervision of pregnancy with other poor reproductive or obstetric history, third trimester: Secondary | ICD-10-CM | POA: Diagnosis not present

## 2020-01-08 ENCOUNTER — Other Ambulatory Visit (HOSPITAL_COMMUNITY)
Admission: RE | Admit: 2020-01-08 | Discharge: 2020-01-08 | Disposition: A | Discharge status: Home / Self Care | Payer: Medicaid Other | Source: Ambulatory Visit | Attending: Obstetrics and Gynecology | Admitting: Obstetrics and Gynecology

## 2020-01-08 ENCOUNTER — Other Ambulatory Visit: Payer: Self-pay

## 2020-01-08 ENCOUNTER — Ambulatory Visit (INDEPENDENT_AMBULATORY_CARE_PROVIDER_SITE_OTHER): Payer: Medicaid Other | Admitting: Obstetrics and Gynecology

## 2020-01-08 ENCOUNTER — Encounter: Payer: Self-pay | Admitting: Obstetrics and Gynecology

## 2020-01-08 ENCOUNTER — Telehealth: Payer: Self-pay | Admitting: *Deleted

## 2020-01-08 VITALS — BP 128/79 | HR 103 | Wt 153.5 lb

## 2020-01-08 DIAGNOSIS — Z3A36 36 weeks gestation of pregnancy: Secondary | ICD-10-CM

## 2020-01-08 DIAGNOSIS — Z3480 Encounter for supervision of other normal pregnancy, unspecified trimester: Secondary | ICD-10-CM | POA: Diagnosis present

## 2020-01-08 DIAGNOSIS — O98212 Gonorrhea complicating pregnancy, second trimester: Secondary | ICD-10-CM

## 2020-01-08 DIAGNOSIS — O98213 Gonorrhea complicating pregnancy, third trimester: Secondary | ICD-10-CM

## 2020-01-08 DIAGNOSIS — Z8759 Personal history of other complications of pregnancy, childbirth and the puerperium: Secondary | ICD-10-CM

## 2020-01-08 NOTE — Progress Notes (Signed)
Patient reports fetal movement with irregular contractions. 

## 2020-01-08 NOTE — Progress Notes (Signed)
   PRENATAL VISIT NOTE  Subjective:  Catherine Collins is a 24 y.o. G2P1001 at [redacted]w[redacted]d being seen today for ongoing prenatal care.  She is currently monitored for the following issues for this high-risk pregnancy and has History of depression; Late prenatal care; Gonorrhea affecting pregnancy in second trimester; History of prior pregnancy with IUGR newborn; History of tobacco use; and Supervision of other normal pregnancy, antepartum on their problem list.  Patient reports contractions since last night.  Contractions: Irregular. Vag. Bleeding: None.  Movement: Present. Denies leaking of fluid.   The following portions of the patient's history were reviewed and updated as appropriate: allergies, current medications, past family history, past medical history, past social history, past surgical history and problem list.   Objective:   Vitals:   01/08/20 1115  BP: 128/79  Pulse: (!) 103  Weight: 153 lb 8 oz (69.6 kg)    Fetal Status: Fetal Heart Rate (bpm): 148 Fundal Height: 35 cm Movement: Present  Presentation: Vertex  General:  Alert, oriented and cooperative. Patient is in no acute distress.  Skin: Skin is warm and dry. No rash noted.   Cardiovascular: Normal heart rate noted  Respiratory: Normal respiratory effort, no problems with respiration noted  Abdomen: Soft, gravid, appropriate for gestational age.  Pain/Pressure: Present     Pelvic: Cervical exam performed Dilation: 2.5 Effacement (%): 50 Station: -3  Extremities: Normal range of motion.  Edema: Trace  Mental Status: Normal mood and affect. Normal behavior. Normal judgment and thought content.   Assessment and Plan:  Pregnancy: G2P1001 at [redacted]w[redacted]d 1. Supervision of other normal pregnancy, antepartum Patient is doing well Labor precautions reviewed Cervical exam unchanged since 1/26 MAU visit Cultures today  2. Gonorrhea affecting pregnancy in second trimester Cultures today  3. History of prior pregnancy with IUGR  newborn 01/02/20 EFW 26%tile  Preterm labor symptoms and general obstetric precautions including but not limited to vaginal bleeding, contractions, leaking of fluid and fetal movement were reviewed in detail with the patient. Please refer to After Visit Summary for other counseling recommendations.   No follow-ups on file.  Future Appointments  Date Time Provider Department Center  03/06/2020  2:00 PM Anson Fret, MD [facility] None    Catalina Antigua, MD

## 2020-01-08 NOTE — Telephone Encounter (Signed)
Pt called to office stating she has been having ctx and would like to know when she needs to be seen.  Pt states she was having some ctx last night and has continued throughout the day.  Pt states she discussed with provider at today's visit but is unsure when she needs to be seen. Pt was made aware is she is ctxing every 3-5 min for 1-2 hours, she should be evaluated.  Pt advised she may take Tylenol and Hydrate well and see if ctx may decrease or stop.  Pt states she has been trying to hydrate. Pt advised to do what her body is telling her, if ctx are stronger and more frequent she needs to be evaluated. Pt states she is at work and will have to go when she gets off. Again, advised pt that if ctx are worse than before and more frequent she may need to leave work and be seen sooner. Advised she may call office anytime with questions.    Pt states understanding.

## 2020-01-09 LAB — CERVICOVAGINAL ANCILLARY ONLY
Chlamydia: NEGATIVE
Comment: NEGATIVE
Comment: NORMAL
Neisseria Gonorrhea: NEGATIVE

## 2020-01-10 LAB — STREP GP B NAA: Strep Gp B NAA: NEGATIVE

## 2020-01-13 ENCOUNTER — Inpatient Hospital Stay (HOSPITAL_COMMUNITY)
Admission: AD | Admit: 2020-01-13 | Discharge: 2020-01-14 | Disposition: A | Discharge status: Home / Self Care | Payer: Medicaid Other | Attending: Obstetrics and Gynecology | Admitting: Obstetrics and Gynecology

## 2020-01-13 ENCOUNTER — Encounter (HOSPITAL_COMMUNITY): Payer: Self-pay | Admitting: Obstetrics and Gynecology

## 2020-01-13 ENCOUNTER — Other Ambulatory Visit: Payer: Self-pay

## 2020-01-13 DIAGNOSIS — Z3689 Encounter for other specified antenatal screening: Secondary | ICD-10-CM

## 2020-01-13 DIAGNOSIS — O479 False labor, unspecified: Secondary | ICD-10-CM

## 2020-01-13 DIAGNOSIS — Z3A37 37 weeks gestation of pregnancy: Secondary | ICD-10-CM | POA: Insufficient documentation

## 2020-01-13 DIAGNOSIS — O471 False labor at or after 37 completed weeks of gestation: Secondary | ICD-10-CM | POA: Insufficient documentation

## 2020-01-13 NOTE — MAU Note (Signed)
Patient presents to MAU c/o ctx q4-70m. Patient states she just got off work and her ctx started around 1600.  +FM, denies vaginal bleeding or LOF.

## 2020-01-14 DIAGNOSIS — O471 False labor at or after 37 completed weeks of gestation: Secondary | ICD-10-CM

## 2020-01-14 DIAGNOSIS — Z3A37 37 weeks gestation of pregnancy: Secondary | ICD-10-CM | POA: Diagnosis not present

## 2020-01-14 DIAGNOSIS — Z3689 Encounter for other specified antenatal screening: Secondary | ICD-10-CM | POA: Diagnosis not present

## 2020-01-14 NOTE — MAU Provider Note (Signed)
S: [name] is a 24 y.o. G2P1001 at [redacted]w[redacted]d  who presents to MAU today for labor evaluation.     Cervical exam by RN:  Dilation: 2 Effacement (%): 80 Station: -3 Exam by:: Ralene Bathe, RN  Fetal Monitoring: Baseline: 120 Variability: Moderate Accelerations: Present Decelerations: None Contractions: Irregular  MDM Discussed patient with RN. NST reviewed.   A: [facility]UP at [redacted]w[redacted]d  False labor NST Reactive  P: Discharge home Labor precautions and kick counts included in AVS Patient to follow-up with [facility] as scheduled  Patient may return to MAU as needed or when in labor   Gerrit Heck, [place] 01/14/2020 12:28 AM

## 2020-01-14 NOTE — Discharge Instructions (Signed)
Contraception Choices Contraception, also called birth control, refers to methods or devices that prevent pregnancy. Hormonal methods Contraceptive implant  A contraceptive implant is a thin, plastic tube that contains a hormone. It is inserted into the upper part of the arm. It can remain in place for up to 3 years. Progestin-only injections Progestin-only injections are injections of progestin, a synthetic form of the hormone progesterone. They are given every 3 months by a health care provider. Birth control pills  Birth control pills are pills that contain hormones that prevent pregnancy. They must be taken once a day, preferably at the same time each day. Birth control patch  The birth control patch contains hormones that prevent pregnancy. It is placed on the skin and must be changed once a week for three weeks and removed on the fourth week. A prescription is needed to use this method of contraception. Vaginal ring  A vaginal ring contains hormones that prevent pregnancy. It is placed in the vagina for three weeks and removed on the fourth week. After that, the process is repeated with a new ring. A prescription is needed to use this method of contraception. Emergency contraceptive Emergency contraceptives prevent pregnancy after unprotected sex. They come in pill form and can be taken up to 5 days after sex. They work best the sooner they are taken after having sex. Most emergency contraceptives are available without a prescription. This method should not be used as your only form of birth control. Barrier methods Female condom  A female condom is a thin sheath that is worn over the penis during sex. Condoms keep sperm from going inside a woman's body. They can be used with a spermicide to increase their effectiveness. They should be disposed after a single use. Female condom  A female condom is a soft, loose-fitting sheath that is put into the vagina before sex. The condom keeps sperm  from going inside a woman's body. They should be disposed after a single use. Diaphragm  A diaphragm is a soft, dome-shaped barrier. It is inserted into the vagina before sex, along with a spermicide. The diaphragm blocks sperm from entering the uterus, and the spermicide kills sperm. A diaphragm should be left in the vagina for 6-8 hours after sex and removed within 24 hours. A diaphragm is prescribed and fitted by a health care provider. A diaphragm should be replaced every 1-2 years, after giving birth, after gaining more than 15 lb (6.8 kg), and after pelvic surgery. Cervical cap  A cervical cap is a round, soft latex or plastic cup that fits over the cervix. It is inserted into the vagina before sex, along with spermicide. It blocks sperm from entering the uterus. The cap should be left in place for 6-8 hours after sex and removed within 48 hours. A cervical cap must be prescribed and fitted by a health care provider. It should be replaced every 2 years. Sponge  A sponge is a soft, circular piece of polyurethane foam with spermicide on it. The sponge helps block sperm from entering the uterus, and the spermicide kills sperm. To use it, you make it wet and then insert it into the vagina. It should be inserted before sex, left in for at least 6 hours after sex, and removed and thrown away within 30 hours. Spermicides Spermicides are chemicals that kill or block sperm from entering the cervix and uterus. They can come as a cream, jelly, suppository, foam, or tablet. A spermicide should be inserted into the   vagina with an applicator at least 10-15 minutes before sex to allow time for it to work. The process must be repeated every time you have sex. Spermicides do not require a prescription. Intrauterine contraception Intrauterine device (IUD) An IUD is a T-shaped device that is put in a woman's uterus. There are two types:  Hormone IUD.This type contains progestin, a synthetic form of the hormone  progesterone. This type can stay in place for 3-5 years.  Copper IUD.This type is wrapped in copper wire. It can stay in place for 10 years.  Permanent methods of contraception Female tubal ligation In this method, a woman's fallopian tubes are sealed, tied, or blocked during surgery to prevent eggs from traveling to the uterus. Hysteroscopic sterilization In this method, a small, flexible insert is placed into each fallopian tube. The inserts cause scar tissue to form in the fallopian tubes and block them, so sperm cannot reach an egg. The procedure takes about 3 months to be effective. Another form of birth control must be used during those 3 months. Female sterilization This is a procedure to tie off the tubes that carry sperm (vasectomy). After the procedure, the man can still ejaculate fluid (semen). Natural planning methods Natural family planning In this method, a couple does not have sex on days when the woman could become pregnant. Calendar method This means keeping track of the length of each menstrual cycle, identifying the days when pregnancy can happen, and not having sex on those days. Ovulation method In this method, a couple avoids sex during ovulation. Symptothermal method This method involves not having sex during ovulation. The woman typically checks for ovulation by watching changes in her temperature and in the consistency of cervical mucus. Post-ovulation method In this method, a couple waits to have sex until after ovulation. Summary  Contraception, also called birth control, means methods or devices that prevent pregnancy.  Hormonal methods of contraception include implants, injections, pills, patches, vaginal rings, and emergency contraceptives.  Barrier methods of contraception can include female condoms, female condoms, diaphragms, cervical caps, sponges, and spermicides.  There are two types of IUDs (intrauterine devices). An IUD can be put in a woman's uterus to  prevent pregnancy for 3-5 years.  Permanent sterilization can be done through a procedure for males, females, or both.  Natural family planning methods involve not having sex on days when the woman could become pregnant. This information is not intended to replace advice given to you by your health care provider. Make sure you discuss any questions you have with your health care provider. Document Revised: 11/18/2017 Document Reviewed: 12/19/2016 Elsevier Patient Education  2020 [employer]  

## 2020-01-15 ENCOUNTER — Telehealth (INDEPENDENT_AMBULATORY_CARE_PROVIDER_SITE_OTHER): Payer: Medicaid Other | Admitting: Obstetrics

## 2020-01-15 ENCOUNTER — Encounter: Payer: Self-pay | Admitting: Obstetrics

## 2020-01-15 VITALS — BP 124/80

## 2020-01-15 DIAGNOSIS — O365990 Maternal care for other known or suspected poor fetal growth, unspecified trimester, not applicable or unspecified: Secondary | ICD-10-CM

## 2020-01-15 DIAGNOSIS — Z8759 Personal history of other complications of pregnancy, childbirth and the puerperium: Secondary | ICD-10-CM

## 2020-01-15 DIAGNOSIS — O0993 Supervision of high risk pregnancy, unspecified, third trimester: Secondary | ICD-10-CM

## 2020-01-15 DIAGNOSIS — O98213 Gonorrhea complicating pregnancy, third trimester: Secondary | ICD-10-CM

## 2020-01-15 DIAGNOSIS — O98212 Gonorrhea complicating pregnancy, second trimester: Secondary | ICD-10-CM

## 2020-01-15 DIAGNOSIS — O365930 Maternal care for other known or suspected poor fetal growth, third trimester, not applicable or unspecified: Secondary | ICD-10-CM

## 2020-01-15 DIAGNOSIS — Z3A37 37 weeks gestation of pregnancy: Secondary | ICD-10-CM

## 2020-01-15 DIAGNOSIS — O0990 Supervision of high risk pregnancy, unspecified, unspecified trimester: Secondary | ICD-10-CM

## 2020-01-15 NOTE — Progress Notes (Signed)
TELEHEALTH OBSTETRICS PRENATAL VIRTUAL VIDEO VISIT ENCOUNTER NOTE  Provider location: [facility] for [facility] [place]   I connected with Catherine Collins on 01/15/20 at 10:15 AM EST by OB MyChart Video Encounter at home and verified that I am speaking with the correct person using two identifiers.   I discussed the limitations, risks, security and privacy concerns of performing an evaluation and management service virtually and the availability of in person appointments. I also discussed with the patient that there may be a patient responsible charge related to this service. The patient expressed understanding and agreed to proceed. Subjective:  Catherine Collins is a 24 y.o. G2P1001 at [redacted]w[redacted]d being seen today for ongoing prenatal care.  She is currently monitored for the following issues for this high-risk pregnancy and has History of depression; Late prenatal care; Gonorrhea affecting pregnancy in second trimester; History of prior pregnancy with IUGR newborn; History of tobacco use; and Supervision of other normal pregnancy, antepartum on their problem list.  Patient reports no complaints.  Contractions: Not present. Vag. Bleeding: None.  Movement: Present. Denies any leaking of fluid.   The following portions of the patient's history were reviewed and updated as appropriate: allergies, current medications, past family history, past medical history, past social history, past surgical history and problem list.   Objective:   Vitals:   01/15/20 0953  BP: 124/80    Fetal Status:     Movement: Present     General:  Alert, oriented and cooperative. Patient is in no acute distress.  Respiratory: Normal respiratory effort, no problems with respiration noted  Mental Status: Normal mood and affect. Normal behavior. Normal judgment and thought content.  Rest of physical exam deferred due to type of encounter  Imaging: [place] MFM OB FOLLOW UP  Result Date:  01/02/2020 ----------------------------------------------------------------------  OBSTETRICS REPORT                       (Signed Final 01/02/2020 09:24 pm) ---------------------------------------------------------------------- Patient Info  ID #:       [id number]                          D.O.B.:  Dec 17, 1995 (23 yrs)  Name:       Catherine Collins                Visit Date: 01/02/2020 12:55 pm ---------------------------------------------------------------------- Performed By  Performed By:     Berlinda Last          Ref. Address:      [address]  [place]                                                              [address]  Attending:        Lin Landsman      [facility]  Referred By:      [facility] ---------------------------------------------------------------------- Orders   #  Description                          Code         Ordered By   1  [place] MFM OB FOLLOW UP                  [phone]     YU FANG  ----------------------------------------------------------------------   #  Order #                    Accession #                 Episode #   1  [id number]                  [id number]                  [id number]  ---------------------------------------------------------------------- Indications   Oligohydraminios, third trimester,             O41.03X0   unspecified (on previous ultrasound)   [redacted] weeks gestation of pregnancy                Z3A.35   Poor obstetric history: Previous fetal growth  O09.299   restriction (FGR)   Alcohol use complicating pregnancy, first      O99.311   trimester  ---------------------------------------------------------------------- Vital Signs                                                  Height:        5'2" ---------------------------------------------------------------------- Fetal Evaluation  Num Of Fetuses:          1  Fetal Heart Rate(bpm):   137  Cardiac Activity:        Observed  Presentation:            Cephalic  Placenta:                Anterior  P. Cord Insertion:       Visualized  Amniotic Fluid  AFI FV:      Within normal limits  AFI Sum(cm)     %Tile       Largest Pocket(cm)  13.9            50  4.01  RUQ(cm)       RLQ(cm)       LUQ(cm)        LLQ(cm)  4.01          2.73          3.63           3.53 ---------------------------------------------------------------------- Biometry  BPD:      83.1  mm     G. Age:  33w 3d          8  %    CI:        78.06   %    70 - 86                                                          FL/HC:       22.5  %    20.1 - 22.3  HC:      297.6  mm     G. Age:  33w 0d        < 1  %    HC/AC:       0.95       0.93 - 1.11  AC:      313.5  mm     G. Age:  35w 2d         54  %    FL/BPD:      80.6  %    71 - 87  FL:         67  mm     G. Age:  34w 3d         20  %    FL/AC:       21.4  %    20 - 24  Est. FW:    2473   gm     5 lb 7 oz     26  % ---------------------------------------------------------------------- OB History  Gravidity:    2         Term:   1        Prem:   0        SAB:   0  TOP:          0       Ectopic:  0        Living: 1 ---------------------------------------------------------------------- Gestational Age  LMP:           35w 2d        Date:  04/30/19                 EDD:   02/04/20  U/S Today:     34w 0d                                        EDD:   02/13/20  Best:          35w 3d     Det. ByMarcella Dubs         EDD:   02/03/20                                      (  07/13/19) ---------------------------------------------------------------------- Anatomy  Cranium:               Appears normal         LVOT:                   Appears normal  Cavum:                 Appears normal         Aortic Arch:             Previously seen  Ventricles:            Appears normal         Ductal Arch:            Previously seen  Choroid Plexus:        Appears normal         Diaphragm:              Appears normal  Cerebellum:            Previously seen        Stomach:                Appears normal, left                                                                        sided  Posterior Fossa:       Previously seen        Abdomen:                Appears normal  Nuchal Fold:           Previously seen        Abdominal Wall:         Previously seen  Face:                  Orbits and profile     Cord Vessels:           Previously seen                         previously seen  Lips:                  Previously seen        Kidneys:                Appear normal  Palate:                Previously seen        Bladder:                Appears normal  Thoracic:              Appears normal         Spine:                  Previously seen  Heart:                 Previously seen        Upper Extremities:      Previously seen  RVOT:  Previously seen        Lower Extremities:      Previously seen  Other:  Fetus appears to be a female prev seen. Heels and 5th digit prev          visualized. Technically difficult due to fetal position. ---------------------------------------------------------------------- Cervix Uterus Adnexa  Cervix  Not visualized (advanced GA >24wks) ---------------------------------------------------------------------- Impression  Ms. Bastedo was seen in follow up due to low normal fluid in  the prior examination.  Today there was normal interval growth and good amniotic  fluid volume.  Good fetal movement was also observed. ---------------------------------------------------------------------- Recommendations  Follow up growth suggested in 3-4 weeks. ----------------------------------------------------------------------               Lin Landsman, MD Electronically Signed Final Report   01/02/2020 09:24 pm  ----------------------------------------------------------------------   Assessment and Plan:  Pregnancy: G2P1001 at [redacted]w[redacted]d 1. Supervision of high risk pregnancy, antepartum  2. Poor fetal growth affecting management of mother, antepartum, single or unspecified fetus  3. History of prior pregnancy with IUGR newborn Rx: - [place] MFM OB FOLLOW UP; Future  4. Gonorrhea affecting pregnancy in second trimester, treated, negative TOC   Term labor symptoms and general obstetric precautions including but not limited to vaginal bleeding, contractions, leaking of fluid and fetal movement were reviewed in detail with the patient. I discussed the assessment and treatment plan with the patient. The patient was provided an opportunity to ask questions and all were answered. The patient agreed with the plan and demonstrated an understanding of the instructions. The patient was advised to call back or seek an in-person office evaluation/go to MAU at [facility] for any urgent or concerning symptoms. Please refer to After Visit Summary for other counseling recommendations.   I provided 10 minutes of face-to-face time during this encounter.  Return in about 1 week (around 01/22/2020) for MyChart HOB-Faculty Only.  Future Appointments  Date Time Provider Department Center  03/06/2020  2:00 PM Anson Fret, MD [facility] None    Coral Ceo, MD Center for [facility], [facility] 01/15/2020

## 2020-01-22 ENCOUNTER — Telehealth (INDEPENDENT_AMBULATORY_CARE_PROVIDER_SITE_OTHER): Payer: Medicaid Other | Admitting: Obstetrics and Gynecology

## 2020-01-22 ENCOUNTER — Encounter: Payer: Self-pay | Admitting: Obstetrics and Gynecology

## 2020-01-22 DIAGNOSIS — O98213 Gonorrhea complicating pregnancy, third trimester: Secondary | ICD-10-CM

## 2020-01-22 DIAGNOSIS — Z8759 Personal history of other complications of pregnancy, childbirth and the puerperium: Secondary | ICD-10-CM

## 2020-01-22 DIAGNOSIS — O98212 Gonorrhea complicating pregnancy, second trimester: Secondary | ICD-10-CM

## 2020-01-22 DIAGNOSIS — Z3A38 38 weeks gestation of pregnancy: Secondary | ICD-10-CM

## 2020-01-22 DIAGNOSIS — Z3480 Encounter for supervision of other normal pregnancy, unspecified trimester: Secondary | ICD-10-CM

## 2020-01-22 NOTE — Progress Notes (Signed)
TELEHEALTH OBSTETRICS PRENATAL VIRTUAL VIDEO VISIT ENCOUNTER NOTE  Provider location: [facility] for [facility] [place]   I connected with Catherine Collins on 01/22/20 at  3:00 PM EST by MyChart Video Encounter at home and verified that I am speaking with the correct person using two identifiers.   I discussed the limitations, risks, security and privacy concerns of performing an evaluation and management service virtually and the availability of in person appointments. I also discussed with the patient that there may be a patient responsible charge related to this service. The patient expressed understanding and agreed to proceed. Subjective:  Catherine Collins is a 24 y.o. G2P1001 at [redacted]w[redacted]d being seen today for ongoing prenatal care.  She is currently monitored for the following issues for this high-risk pregnancy and has History of depression; Late prenatal care; Gonorrhea affecting pregnancy in second trimester; History of prior pregnancy with IUGR newborn; History of tobacco use; and Supervision of other normal pregnancy, antepartum on their problem list.  Patient reports no complaints.  Contractions: Irregular. Vag. Bleeding: None.  Movement: Present. Denies any leaking of fluid.   The following portions of the patient's history were reviewed and updated as appropriate: allergies, current medications, past family history, past medical history, past social history, past surgical history and problem list.   Objective:  There were no vitals filed for this visit.  Fetal Status:     Movement: Present     General:  Alert, oriented and cooperative. Patient is in no acute distress.  Respiratory: Normal respiratory effort, no problems with respiration noted  Mental Status: Normal mood and affect. Normal behavior. Normal judgment and thought content.  Rest of physical exam deferred due to type of encounter  Imaging: [place] MFM OB FOLLOW UP  Result Date:  01/02/2020 ----------------------------------------------------------------------  OBSTETRICS REPORT                       (Signed Final 01/02/2020 09:24 pm) ---------------------------------------------------------------------- Patient Info  ID #:       [id number]                          D.O.B.:  03/14/1996 (23 yrs)  Name:       Catherine Collins                Visit Date: 01/02/2020 12:55 pm ---------------------------------------------------------------------- Performed By  Performed By:     Marcellina Millin          Ref. Address:      [address]                                                              [phone]  [place]                                                              [address]  Attending:        Lin Landsman      [facility]                                        [facility]  Referred By:      [facility] ---------------------------------------------------------------------- Orders   #  Description                          Code         Ordered By   1  [place] MFM OB FOLLOW UP                  [phone]     YU FANG  ----------------------------------------------------------------------   #  Order #                    Accession #                 Episode #   1  [id number]                  [id number]                  [id number]  ---------------------------------------------------------------------- Indications   Oligohydraminios, third trimester,             O41.03X0   unspecified (on previous ultrasound)   [redacted] weeks gestation of pregnancy                Z3A.35   Poor obstetric history: Previous fetal growth  O09.299   restriction (FGR)   Alcohol use complicating pregnancy, first      O99.311   trimester  ---------------------------------------------------------------------- Vital Signs                                                  Height:        5'2" ---------------------------------------------------------------------- Fetal Evaluation  Num Of Fetuses:          1  Fetal Heart Rate(bpm):   137  Cardiac Activity:        Observed  Presentation:            Cephalic  Placenta:                Anterior  P. Cord Insertion:       Visualized  Amniotic Fluid  AFI FV:      Within normal limits  AFI Sum(cm)     %Tile       Largest Pocket(cm)  13.9            50  4.01  RUQ(cm)       RLQ(cm)       LUQ(cm)        LLQ(cm)  4.01          2.73          3.63           3.53 ---------------------------------------------------------------------- Biometry  BPD:      83.1  mm     G. Age:  33w 3d          8  %    CI:        78.06   %    70 - 86                                                          FL/HC:       22.5  %    20.1 - 22.3  HC:      297.6  mm     G. Age:  33w 0d        < 1  %    HC/AC:       0.95       0.93 - 1.11  AC:      313.5  mm     G. Age:  35w 2d         54  %    FL/BPD:      80.6  %    71 - 87  FL:         67  mm     G. Age:  34w 3d         20  %    FL/AC:       21.4  %    20 - 24  Est. FW:    2473   gm     5 lb 7 oz     26  % ---------------------------------------------------------------------- OB History  Gravidity:    2         Term:   1        Prem:   0        SAB:   0  TOP:          0       Ectopic:  0        Living: 1 ---------------------------------------------------------------------- Gestational Age  LMP:           35w 2d        Date:  04/30/19                 EDD:   02/04/20  U/S Today:     34w 0d                                        EDD:   02/13/20  Best:          35w 3d     Det. ByMarcella Dubs         EDD:   02/03/20                                      (  07/13/19) ---------------------------------------------------------------------- Anatomy  Cranium:               Appears normal         LVOT:                   Appears normal  Cavum:                 Appears normal         Aortic Arch:             Previously seen  Ventricles:            Appears normal         Ductal Arch:            Previously seen  Choroid Plexus:        Appears normal         Diaphragm:              Appears normal  Cerebellum:            Previously seen        Stomach:                Appears normal, left                                                                        sided  Posterior Fossa:       Previously seen        Abdomen:                Appears normal  Nuchal Fold:           Previously seen        Abdominal Wall:         Previously seen  Face:                  Orbits and profile     Cord Vessels:           Previously seen                         previously seen  Lips:                  Previously seen        Kidneys:                Appear normal  Palate:                Previously seen        Bladder:                Appears normal  Thoracic:              Appears normal         Spine:                  Previously seen  Heart:                 Previously seen        Upper Extremities:      Previously seen  RVOT:  Previously seen        Lower Extremities:      Previously seen  Other:  Fetus appears to be a female prev seen. Heels and 5th digit prev          visualized. Technically difficult due to fetal position. ---------------------------------------------------------------------- Cervix Uterus Adnexa  Cervix  Not visualized (advanced GA >24wks) ---------------------------------------------------------------------- Impression  Ms. Surges was seen in follow up due to low normal fluid in  the prior examination.  Today there was normal interval growth and good amniotic  fluid volume.  Good fetal movement was also observed. ---------------------------------------------------------------------- Recommendations  Follow up growth suggested in 3-4 weeks. ----------------------------------------------------------------------               Lin Landsman, MD Electronically Signed Final Report   01/02/2020 09:24 pm  ----------------------------------------------------------------------   Assessment and Plan:  Pregnancy: G2P1001 at [redacted]w[redacted]d 1. Supervision of other normal pregnancy, antepartum Patient is doing well without complaints  2. History of prior pregnancy with IUGR newborn Follow up growth scan on 2/23  3. Gonorrhea affecting pregnancy in second trimester TOC negative  Preterm labor symptoms and general obstetric precautions including but not limited to vaginal bleeding, contractions, leaking of fluid and fetal movement were reviewed in detail with the patient. I discussed the assessment and treatment plan with the patient. The patient was provided an opportunity to ask questions and all were answered. The patient agreed with the plan and demonstrated an understanding of the instructions. The patient was advised to call back or seek an in-person office evaluation/go to MAU at [facility] [place] for any urgent or concerning symptoms. Please refer to After Visit Summary for other counseling recommendations.   I provided 11 minutes of face-to-face time during this encounter.  No follow-ups on file.  Future Appointments  Date Time Provider Department Center  01/22/2020  3:00 PM Sugey Trevathan, Gigi Gin, MD [facility] None  01/23/2020  2:45 PM [facility] NURSE [facility]  01/23/2020  2:45 PM [facility] [place] 2 [facility]-US  03/06/2020  2:00 PM Anson Fret, MD [facility] None    Catalina Antigua, MD Center for [facility], [facility]

## 2020-01-22 NOTE — Progress Notes (Signed)
Pt is unable to check BP today.

## 2020-01-23 ENCOUNTER — Encounter (HOSPITAL_COMMUNITY): Payer: Self-pay | Admitting: *Deleted

## 2020-01-23 ENCOUNTER — Other Ambulatory Visit: Payer: Self-pay

## 2020-01-23 ENCOUNTER — Ambulatory Visit (HOSPITAL_COMMUNITY): Payer: Medicaid Other | Admitting: *Deleted

## 2020-01-23 ENCOUNTER — Ambulatory Visit (HOSPITAL_COMMUNITY)
Admission: RE | Admit: 2020-01-23 | Discharge: 2020-01-23 | Disposition: A | Discharge status: Home / Self Care | Payer: Medicaid Other | Source: Ambulatory Visit | Attending: Obstetrics | Admitting: Obstetrics

## 2020-01-23 VITALS — BP 115/65 | HR 96 | Temp 97.5°F

## 2020-01-23 DIAGNOSIS — Z8759 Personal history of other complications of pregnancy, childbirth and the puerperium: Secondary | ICD-10-CM

## 2020-01-23 DIAGNOSIS — Z3A38 38 weeks gestation of pregnancy: Secondary | ICD-10-CM | POA: Diagnosis not present

## 2020-01-23 DIAGNOSIS — Z362 Encounter for other antenatal screening follow-up: Secondary | ICD-10-CM

## 2020-01-23 DIAGNOSIS — O09293 Supervision of pregnancy with other poor reproductive or obstetric history, third trimester: Secondary | ICD-10-CM | POA: Diagnosis not present

## 2020-01-29 ENCOUNTER — Inpatient Hospital Stay (HOSPITAL_COMMUNITY): Payer: Medicaid Other | Admitting: Anesthesiology

## 2020-01-29 ENCOUNTER — Encounter (HOSPITAL_COMMUNITY): Payer: Self-pay | Admitting: Obstetrics & Gynecology

## 2020-01-29 ENCOUNTER — Inpatient Hospital Stay (HOSPITAL_COMMUNITY)
Admission: AD | Admit: 2020-01-29 | Discharge: 2020-01-31 | DRG: 807 | Disposition: A | Discharge status: Home / Self Care | Payer: Medicaid Other | Attending: Obstetrics and Gynecology | Admitting: Obstetrics and Gynecology

## 2020-01-29 ENCOUNTER — Other Ambulatory Visit: Payer: Self-pay

## 2020-01-29 ENCOUNTER — Telehealth: Payer: Medicaid Other | Admitting: Obstetrics

## 2020-01-29 DIAGNOSIS — O365990 Maternal care for other known or suspected poor fetal growth, unspecified trimester, not applicable or unspecified: Secondary | ICD-10-CM

## 2020-01-29 DIAGNOSIS — O98212 Gonorrhea complicating pregnancy, second trimester: Secondary | ICD-10-CM

## 2020-01-29 DIAGNOSIS — Z8659 Personal history of other mental and behavioral disorders: Secondary | ICD-10-CM

## 2020-01-29 DIAGNOSIS — Z20822 Contact with and (suspected) exposure to covid-19: Secondary | ICD-10-CM | POA: Diagnosis present

## 2020-01-29 DIAGNOSIS — Z3A39 39 weeks gestation of pregnancy: Secondary | ICD-10-CM

## 2020-01-29 DIAGNOSIS — Z8759 Personal history of other complications of pregnancy, childbirth and the puerperium: Secondary | ICD-10-CM

## 2020-01-29 DIAGNOSIS — O26893 Other specified pregnancy related conditions, third trimester: Secondary | ICD-10-CM | POA: Diagnosis present

## 2020-01-29 DIAGNOSIS — O0990 Supervision of high risk pregnancy, unspecified, unspecified trimester: Secondary | ICD-10-CM

## 2020-01-29 DIAGNOSIS — Z3480 Encounter for supervision of other normal pregnancy, unspecified trimester: Secondary | ICD-10-CM

## 2020-01-29 DIAGNOSIS — Z87891 Personal history of nicotine dependence: Secondary | ICD-10-CM | POA: Diagnosis not present

## 2020-01-29 DIAGNOSIS — O0930 Supervision of pregnancy with insufficient antenatal care, unspecified trimester: Secondary | ICD-10-CM

## 2020-01-29 LAB — TYPE AND SCREEN
ABO/RH(D): B POS
Antibody Screen: NEGATIVE

## 2020-01-29 LAB — RPR: RPR Ser Ql: NONREACTIVE

## 2020-01-29 LAB — RESPIRATORY PANEL BY RT PCR (FLU A&B, COVID)
Influenza A by PCR: NEGATIVE
Influenza B by PCR: NEGATIVE
SARS Coronavirus 2 by RT PCR: NEGATIVE

## 2020-01-29 LAB — CBC
HCT: 34.3 % — ABNORMAL LOW (ref 36.0–46.0)
Hemoglobin: 10.9 g/dL — ABNORMAL LOW (ref 12.0–15.0)
MCH: 30.9 pg (ref 26.0–34.0)
MCHC: 31.8 g/dL (ref 30.0–36.0)
MCV: 97.2 fL (ref 80.0–100.0)
Platelets: 250 10*3/uL (ref 150–400)
RBC: 3.53 MIL/uL — ABNORMAL LOW (ref 3.87–5.11)
RDW: 13.3 % (ref 11.5–15.5)
WBC: 13.5 10*3/uL — ABNORMAL HIGH (ref 4.0–10.5)
nRBC: 0 % (ref 0.0–0.2)

## 2020-01-29 LAB — ABO/RH: ABO/RH(D): B POS

## 2020-01-29 MED ORDER — OXYTOCIN 40 UNITS IN NORMAL SALINE INFUSION - SIMPLE MED: 2.5000 [IU]/h | INTRAVENOUS | Status: DC | PRN

## 2020-01-29 MED ORDER — SOD CITRATE-CITRIC ACID 500-334 MG/5ML PO SOLN: 30.0000 mL | ORAL | Status: DC | PRN

## 2020-01-29 MED ORDER — WITCH HAZEL-GLYCERIN EX PADS: 1.0000 "application " | MEDICATED_PAD | CUTANEOUS | Status: DC | PRN

## 2020-01-29 MED ORDER — FENTANYL-BUPIVACAINE-NACL 0.5-0.125-0.9 MG/250ML-% EP SOLN: 12.0000 mL/h | EPIDURAL | Status: DC | PRN

## 2020-01-29 MED ORDER — EPHEDRINE 5 MG/ML INJ: 10.0000 mg | INTRAVENOUS | Status: DC | PRN

## 2020-01-29 MED ORDER — OXYCODONE HCL 5 MG PO TABS
5.0000 mg | ORAL_TABLET | ORAL | Status: DC | PRN
  Administered 2020-01-30 (×4): 5 mg via ORAL
  Filled 2020-01-29 (×4): qty 1

## 2020-01-29 MED ORDER — PHENYLEPHRINE 40 MCG/ML (10ML) SYRINGE FOR IV PUSH (FOR BLOOD PRESSURE SUPPORT): 80.0000 ug | PREFILLED_SYRINGE | INTRAVENOUS | Status: DC | PRN

## 2020-01-29 MED ORDER — DIPHENHYDRAMINE HCL 25 MG PO CAPS: 25.0000 mg | ORAL_CAPSULE | Freq: Four times a day (QID) | ORAL | Status: DC | PRN

## 2020-01-29 MED ORDER — METHYLERGONOVINE MALEATE 0.2 MG/ML IJ SOLN: 0.2000 mg | INTRAMUSCULAR | Status: DC | PRN

## 2020-01-29 MED ORDER — TETANUS-DIPHTH-ACELL PERTUSSIS 5-2.5-18.5 LF-MCG/0.5 IM SUSP: 0.5000 mL | Freq: Once | INTRAMUSCULAR | Status: DC

## 2020-01-29 MED ORDER — ONDANSETRON HCL 4 MG/2ML IJ SOLN
4.0000 mg | Freq: Four times a day (QID) | INTRAMUSCULAR | Status: DC | PRN
  Administered 2020-01-29: 4 mg via INTRAVENOUS
  Filled 2020-01-29: qty 2

## 2020-01-29 MED ORDER — SODIUM CHLORIDE (PF) 0.9 % IJ SOLN
INTRAMUSCULAR | Status: DC | PRN
  Administered 2020-01-29: 12 mL/h via EPIDURAL

## 2020-01-29 MED ORDER — METHYLERGONOVINE MALEATE 0.2 MG PO TABS: 0.2000 mg | ORAL_TABLET | ORAL | Status: DC | PRN

## 2020-01-29 MED ORDER — LIDOCAINE HCL (PF) 1 % IJ SOLN
INTRAMUSCULAR | Status: DC | PRN
  Administered 2020-01-29: 4 mL via EPIDURAL
  Administered 2020-01-29: 5 mL via EPIDURAL

## 2020-01-29 MED ORDER — LACTATED RINGERS IV SOLN: 500.0000 mL | Freq: Once | INTRAVENOUS | Status: DC

## 2020-01-29 MED ORDER — ACETAMINOPHEN 325 MG PO TABS
650.0000 mg | ORAL_TABLET | ORAL | Status: DC | PRN
  Administered 2020-01-29 – 2020-01-30 (×3): 650 mg via ORAL
  Filled 2020-01-29 (×3): qty 2

## 2020-01-29 MED ORDER — ONDANSETRON HCL 4 MG PO TABS: 4.0000 mg | ORAL_TABLET | ORAL | Status: DC | PRN

## 2020-01-29 MED ORDER — LIDOCAINE HCL (PF) 1 % IJ SOLN: 30.0000 mL | INTRAMUSCULAR | Status: DC | PRN

## 2020-01-29 MED ORDER — ACETAMINOPHEN 325 MG PO TABS: 650.0000 mg | ORAL_TABLET | ORAL | Status: DC | PRN

## 2020-01-29 MED ORDER — OXYTOCIN 40 UNITS IN NORMAL SALINE INFUSION - SIMPLE MED
2.5000 [IU]/h | INTRAVENOUS | Status: DC
  Administered 2020-01-29: 2.5 [IU]/h via INTRAVENOUS
  Filled 2020-01-29: qty 1000

## 2020-01-29 MED ORDER — IBUPROFEN 600 MG PO TABS
600.0000 mg | ORAL_TABLET | Freq: Four times a day (QID) | ORAL | Status: DC
  Administered 2020-01-29 – 2020-01-31 (×6): 600 mg via ORAL
  Filled 2020-01-29 (×7): qty 1

## 2020-01-29 MED ORDER — OXYCODONE HCL 5 MG PO TABS
10.0000 mg | ORAL_TABLET | ORAL | Status: DC | PRN
  Administered 2020-01-31: 10 mg via ORAL
  Filled 2020-01-29: qty 2

## 2020-01-29 MED ORDER — LACTATED RINGERS IV SOLN: INTRAVENOUS | Status: DC

## 2020-01-29 MED ORDER — FENTANYL CITRATE (PF) 100 MCG/2ML IJ SOLN: 100.0000 ug | INTRAMUSCULAR | Status: DC | PRN

## 2020-01-29 MED ORDER — BENZOCAINE-MENTHOL 20-0.5 % EX AERO
1.0000 "application " | INHALATION_SPRAY | CUTANEOUS | Status: DC | PRN
  Administered 2020-01-29: 1 via TOPICAL
  Filled 2020-01-29: qty 56

## 2020-01-29 MED ORDER — FENTANYL-BUPIVACAINE-NACL 0.5-0.125-0.9 MG/250ML-% EP SOLN
EPIDURAL | Status: AC
  Filled 2020-01-29: qty 250

## 2020-01-29 MED ORDER — PRENATAL MULTIVITAMIN CH
1.0000 | ORAL_TABLET | Freq: Every day | ORAL | Status: DC
  Administered 2020-01-30 – 2020-01-31 (×2): 1 via ORAL
  Filled 2020-01-29 (×2): qty 1

## 2020-01-29 MED ORDER — DIBUCAINE (PERIANAL) 1 % EX OINT: 1.0000 "application " | TOPICAL_OINTMENT | CUTANEOUS | Status: DC | PRN

## 2020-01-29 MED ORDER — OXYTOCIN BOLUS FROM INFUSION
500.0000 mL | Freq: Once | INTRAVENOUS | Status: AC
  Administered 2020-01-29: 500 mL via INTRAVENOUS

## 2020-01-29 MED ORDER — COCONUT OIL OIL: 1.0000 "application " | TOPICAL_OIL | Status: DC | PRN

## 2020-01-29 MED ORDER — LACTATED RINGERS IV SOLN: 500.0000 mL | INTRAVENOUS | Status: DC | PRN

## 2020-01-29 MED ORDER — DIPHENHYDRAMINE HCL 50 MG/ML IJ SOLN: 12.5000 mg | INTRAMUSCULAR | Status: DC | PRN

## 2020-01-29 MED ORDER — SIMETHICONE 80 MG PO CHEW: 80.0000 mg | CHEWABLE_TABLET | ORAL | Status: DC | PRN

## 2020-01-29 MED ORDER — ONDANSETRON HCL 4 MG/2ML IJ SOLN: 4.0000 mg | INTRAMUSCULAR | Status: DC | PRN

## 2020-01-29 NOTE — Anesthesia Procedure Notes (Signed)
Epidural Patient location during procedure: OB Start time: 01/29/2020 6:28 AM End time: 01/29/2020 6:31 AM  Staffing Anesthesiologist: Beryle Lathe, MD Performed: anesthesiologist   Preanesthetic Checklist Completed: patient identified, IV checked, risks and benefits discussed, monitors and equipment checked, pre-op evaluation and timeout performed  Epidural Patient position: sitting Prep: DuraPrep Patient monitoring: continuous pulse ox and blood pressure Approach: midline Location: L3-L4 Injection technique: LOR saline  Needle:  Needle type: Tuohy  Needle gauge: 17 G Needle length: 9 cm Needle insertion depth: 5 cm Catheter size: 19 Gauge Catheter at skin depth: 10 cm Test dose: negative and Other (1% lidocaine)  Assessment Events: blood not aspirated  Additional Notes Patient identified. Risks including, but not limited to, bleeding, infection, nerve damage, paralysis, inadequate analgesia, blood pressure changes, nausea, vomiting, allergic reaction, postpartum back pain, itching, and headache were discussed. Patient expressed understanding and wished to proceed. Sterile prep and drape, including hand hygiene, mask, and sterile gloves were used. The patient was positioned and the spine was prepped. The skin was anesthetized with lidocaine. No paraesthesia or other complication noted. The patient did not experience any signs of intravascular injection such as tinnitus or metallic taste in mouth, nor signs of intrathecal spread such as rapid motor block. Please see nursing notes for vital signs. The patient tolerated the procedure well.   Leslye Peer, MDReason for block:procedure for pain

## 2020-01-29 NOTE — H&P (Signed)
OBSTETRIC ADMISSION HISTORY AND PHYSICAL  Catherine Collins is a 24 y.o. female G2P1001 with IUP at 4w2dby 18 wk [place]resenting for SOL. Reports ctx q2-310mShe reports +FMs, No LOF, no VB, no blurry vision, headaches or peripheral edema, and RUQ pain.  She plans on breast and bottle feeding. She requests condoms for birth control. [place] [facility] [place]  Estimated Date of Delivery: 02/03/20  Sono: 2/23  '@[redacted]w[redacted]d' , CWD, normal anatomy, cephalic presentation, anterior placental lie, 3061g, 27% EFW  Prenatal History/Complications: Hx of IUGR with first pregnancy Alcohol use in first trimester Gonorrhea in pregnancy  Tobacco use  Past Medical History: Past Medical History:  Diagnosis Date  . Anemia   . Anxiety   . Depression   . Mental disorder     Past Surgical History: Past Surgical History:  Procedure Laterality Date  . WISDOM TOOTH EXTRACTION      Obstetrical History: OB History    Gravida  2   Para  1   Term  1   Preterm      AB      Living  1     SAB      TAB      Ectopic      Multiple  0   Live Births  1           Social History: Social History   Socioeconomic History  . Marital status: Single    Spouse name: Not on file  . Number of children: 1  . Years of education: Not on file  . Highest education level: 10th grade  Occupational History  . Not on file  Tobacco Use  . Smoking status: Former Smoker    Packs/day: 0.50    Types: Cigarettes    Quit date: 02/11/2018    Years since quitting: 1.9  . Smokeless tobacco: Never Used  . Tobacco comment: stopped smoking after preg confirm  Substance and Sexual Activity  . Alcohol use: Not Currently    Alcohol/week: 0.0 standard drinks  . Drug use: No  . Sexual activity: Yes    Birth control/protection: None  Other Topics Concern  . Not on file  Social History Narrative   Lives with child   Social Determinants of Health   Financial Resource  Strain:   . Difficulty of Paying Living Expenses: Not on file  Food Insecurity:   . Worried About RuCharity fundraisern the Last Year: Not on file  . Ran Out of Food in the Last Year: Not on file  Transportation Needs:   . Lack of Transportation (Medical): Not on file  . Lack of Transportation (Non-Medical): Not on file  Physical Activity:   . Days of Exercise per Week: Not on file  . Minutes of Exercise per Session: Not on file  Stress:   . Feeling of Stress : Not on file  Social Connections:   . Frequency of Communication with Friends and Family: Not on file  . Frequency of Social Gatherings with Friends and Family: Not on file  . Attends Religious Services: Not on file  . Active Member of Clubs or Organizations: Not on file  . Attends ClArchivisteetings: Not on file  . Marital Status: Not on file    Family History: Family History  Problem Relation Age of Onset  . Drug abuse Maternal Grandfather   . Diabetes Maternal Grandfather   .  Hypertension Maternal Grandfather   . Vision loss Maternal Grandfather   . Cancer Maternal Grandmother   . COPD Maternal Grandmother     Allergies: Allergies  Allergen Reactions  . Betamethasone Rash    Medications Prior to Admission  Medication Sig Dispense Refill Last Dose  . acetaminophen (TYLENOL) 325 MG tablet Take 650 mg by mouth every 6 (six) hours as needed.    at Not taking  . Blood Pressure Monitor KIT 1 kit by Does not apply route once a week. Check BP weekly. Large Cuff DX: Z34.6 1 kit 0   . Elastic Bandages & Supports (COMFORT FIT MATERNITY SUPP SM) MISC Wear as directed. 1 each 0   . ondansetron (ZOFRAN) 4 MG tablet Take 4 mg by mouth 3 (three) times daily as needed.    at Not taking  . Prenat-FeCbn-FeAspGl-FA-Omega (OB COMPLETE PETITE) 35-5-1-200 MG CAPS Take 1 tablet by mouth daily. 30 capsule 12      Review of Systems   All systems reviewed and negative except as stated in HPI  Blood pressure 137/80,  pulse 100, temperature 97.8 F (36.6 C), temperature source Oral, resp. rate 20, last menstrual period 03/02/2019, not currently breastfeeding. General appearance: alert, cooperative and appears stated age Lungs: normal effort Heart: regular rate  Abdomen: soft, non-tender; bowel sounds normal Pelvic: gravid uterus Extremities: Homans sign is negative, no sign of DVT Presentation: cephalic Fetal monitoringBaseline: 130 bpm, Variability: Good {> 6 bpm), Accelerations: Reactive and Decelerations: Absent Uterine activity: Frequency: Every 2-3 minutes Dilation: 6 Effacement (%): 90 Station: -2 Exam by:: Esau Grew, RN   Prenatal labs: ABO, Rh: B/Positive/-- (08/13 1437) Antibody: Negative (08/13 1437) Rubella: 1.44 (08/13 1437) RPR: Non Reactive (12/15 0931)  HBsAg: Negative (08/13 1437)  HIV: Non Reactive (12/15 0931)  GBS: --Henderson Cloud (02/08 1151)  2 hr Glucola WNL Genetic screening  Low risk female Anatomy US WNL  Prenatal Transfer Tool  Maternal Diabetes: No Genetic Screening: Normal Maternal Ultrasounds/Referrals: Normal Fetal Ultrasounds or other Referrals:  None Maternal Substance Abuse:  Yes:  Type: Smoker only in early pregnancy  Significant Maternal Medications:  None Significant Maternal Lab Results: Group B Strep negative  No results found for this or any previous visit (from the past 24 hour(s)).  Patient Active Problem List   Diagnosis Date Noted  . Supervision of other normal pregnancy, antepartum 07/13/2019  . History of prior pregnancy with IUGR newborn 09/13/2018  . History of tobacco use 09/13/2018  . Gonorrhea affecting pregnancy in second trimester 05/24/2018  . History of depression 03/29/2018  . Late prenatal care 03/29/2018    Assessment/Plan:  Catherine Collins is a 24 y.o. G2P1001 at 66w2dhere for SOL.  #Labor: Vertex by exam. Expectant management. Anticipate SVD.  #Pain: Per patient request #FWB: Cat I; EFW: 3000g #ID:  GBS neg #MOF:  Both #MOC: Condoms #Circ:  Outpatient  #Tobacco Use: only in early pregnancy; denies currently   CBarrington Ellison MD [facility] Fellow, [facility] 01/29/2020, 6:00 AM

## 2020-01-29 NOTE — Lactation Note (Addendum)
This note was copied from a baby's chart. Lactation Consultation Note  Patient Name: Catherine Collins [id number] Date: 01/29/2020 Reason for consult: Initial assessment;Term P2, 6 hour female infant. Mom with hx: close space pregnancies  Mom is active on the [facility] program in [facility] she doesn't have DEBP at home. LC gave mom harmony hand pump for prn (short term) mom will need DEBP for home use if she is not latching infant at breast. Mom is breast and formula feeding infant. Per mom, she briefly latch her one year old daughter to breast for only few days she found breastfeeding to be frustrating she knows it is healthy for her baby that is why she pumped and gave infant EBM for 3 months. Mom plans to pump and formula fed infant only, mom  doesn't want to latch infant to breast. DEBP has been set up and explained how to be used by RN, per mom she plans to start using it after she feeds infant at next feed. Mom feels she will have good milk supply she started leaking in her pregnancy at 28 weeks all the way until delivery of infant . When she pumped with her daughter her milk came in quickly. Mom will use DEBP every 3 hours for 15 minutes on initial setting. Mom shown how to use DEBP & how to disassemble, clean, & reassemble parts. Mom knows to call [facility] services if she has any breastfeeding questions, concerns or if she decides to latch infant at breast while in hospital.  Mom made aware of O/P services, breastfeeding support groups, community resources, and our phone # for post-discharge questions.    Maternal Data Formula Feeding for Exclusion: Yes Reason for exclusion: Mother's choice to formula and breast feed on admission Has patient been taught Hand Expression?: Yes Does the patient have breastfeeding experience prior to this delivery?: Yes  Feeding Feeding Type: Formula  LATCH Score                   Interventions Interventions: Breast feeding basics reviewed;Hand  pump;DEBP;Skin to skin;Hand express  Lactation Tools Discussed/Used Tools: Pump Breast pump type: Double-Electric Breast Pump;Manual WIC Program: Yes Pump Review: Setup, frequency, and cleaning;Milk Storage Initiated by:: Pollyann Kennedy ,RN  Date initiated:: 01/29/20   Consult Status Consult Status: Follow-up Date: 01/30/20 Follow-up type: In-patient    Danelle Earthly 01/29/2020, 9:42 PM

## 2020-01-29 NOTE — Progress Notes (Signed)
LABOR PROGRESS NOTE  Catherine Collins is a 25 y.o. G2P1001 at [redacted]w[redacted]d  admitted for spontaneous labor.  Subjective: Comfortable w epidural  Objective: BP 110/75   Pulse 88   Temp (!) 97.5 F (36.4 C) (Oral)   Resp 17   LMP 03/02/2019   SpO2 99%  or  Vitals:   01/29/20 0932 01/29/20 1034 01/29/20 1106 01/29/20 1132  BP: 117/79 (!) 105/56 (!) 99/51 110/75  Pulse: (!) 113 81 77 88  Resp:  17 17   Temp:   (!) 97.5 F (36.4 C)   TempSrc:   Oral   SpO2:         Dilation: Lip/rim Effacement (%): 100 Cervical Position: Anterior Station: Plus 1 Presentation: Vertex Exam by:: stone rnc FHT: baseline rate 130, moderate varibility, +acel, -decel Toco: q2-3 min  Labs: Lab Results  Component Value Date   WBC 13.5 (H) 01/29/2020   HGB 10.9 (L) 01/29/2020   HCT 34.3 (L) 01/29/2020   MCV 97.2 01/29/2020   PLT 250 01/29/2020    Patient Active Problem List   Diagnosis Date Noted  . Supervision of other normal pregnancy, antepartum 07/13/2019  . History of prior pregnancy with IUGR newborn 09/13/2018  . History of tobacco use 09/13/2018  . Gonorrhea affecting pregnancy in second trimester 05/24/2018  . History of depression 03/29/2018  . Late prenatal care 03/29/2018    Assessment / Plan: 24 y.o. G2P1001 at [redacted]w[redacted]d here for spontaneous labor.  Labor: s/p AROM for clear @0900 , now with rim/lip at +1 station, will allow further hour for dilation/descent.  Fetal Wellbeing:  Cat I Pain Control:  epidural GBS: neg Anticipated MOD:  SVD   , MD/MP[facility] OB Fellow  01/29/2020, 11:39 AM

## 2020-01-29 NOTE — Progress Notes (Signed)
LABOR PROGRESS NOTE  Catherine Collins is a 24 y.o. G2P1001 at [redacted]w[redacted]d  admitted for spontaneous labor.  Subjective: Comfortable w epidural  Objective: BP 117/79   Pulse (!) 113   Temp 97.7 F (36.5 C) (Oral)   Resp 18   LMP 03/02/2019   SpO2 99%  or  Vitals:   01/29/20 0800 01/29/20 0833 01/29/20 0835 01/29/20 0932  BP: 116/72  (!) 98/49 117/79  Pulse: (!) 103  81 (!) 113  Resp:  18    Temp: 97.7 F (36.5 C)     TempSrc: Oral     SpO2:         Dilation: 8.5 Effacement (%): 90 Cervical Position: Anterior Station: 0 Presentation: Vertex Exam by:: Aziza Stuckert md FHT: baseline rate 135, moderate varibility, +acel, variable decel Toco: tracing difficult to interpret  Labs: Lab Results  Component Value Date   WBC 13.5 (H) 01/29/2020   HGB 10.9 (L) 01/29/2020   HCT 34.3 (L) 01/29/2020   MCV 97.2 01/29/2020   PLT 250 01/29/2020    Patient Active Problem List   Diagnosis Date Noted  . Supervision of other normal pregnancy, antepartum 07/13/2019  . History of prior pregnancy with IUGR newborn 09/13/2018  . History of tobacco use 09/13/2018  . Gonorrhea affecting pregnancy in second trimester 05/24/2018  . History of depression 03/29/2018  . Late prenatal care 03/29/2018    Assessment / Plan: 24 y.o. G2P1001 at [redacted]w[redacted]d here for spontaneous labor.  Labor: AROM for clear with descent from -2 to 0 station, anticipate delivery soon Fetal Wellbeing:  Cat II for variables but overall reassuring w moderate variability and accels Pain Control:  epidural GBS: neg Anticipated MOD:  SVD   Zack Seal, MD/MP[facility] OB Fellow  01/29/2020, 9:40 AM

## 2020-01-29 NOTE — MAU Note (Signed)
Pt reports to MAU complaining of CTX every 2-3 mins and the urge to push. +FM. No LOF. No vaginal bleeding. RN exam 4cm.

## 2020-01-29 NOTE — Anesthesia Preprocedure Evaluation (Signed)
Anesthesia Evaluation  Patient identified by MRN, date of birth, ID band Patient awake    Reviewed: Allergy & Precautions, NPO status , Patient's Chart, lab work & pertinent test results  History of Anesthesia Complications Negative for: history of anesthetic complications  Airway Mallampati: II   Neck ROM: Full    Dental   Pulmonary former smoker,    Pulmonary exam normal        Cardiovascular negative cardio ROS Normal cardiovascular exam     Neuro/Psych PSYCHIATRIC DISORDERS Anxiety Depression negative neurological ROS     GI/Hepatic negative GI ROS, Neg liver ROS,   Endo/Other  negative endocrine ROS  Renal/GU negative Renal ROS     Musculoskeletal negative musculoskeletal ROS (+)   Abdominal   Peds  Hematology  (+) anemia ,  Plt 250k    Anesthesia Other Findings Covid pending   Reproductive/Obstetrics (+) Pregnancy                             Anesthesia Physical Anesthesia Plan  ASA: II  Anesthesia Plan: Epidural   Post-op Pain Management:    Induction:   PONV Risk Score and Plan: 2 and Treatment may vary due to age or medical condition  Airway Management Planned: Natural Airway  Additional Equipment: None  Intra-op Plan:   Post-operative Plan:   Informed Consent: I have reviewed the patients History and Physical, chart, labs and discussed the procedure including the risks, benefits and alternatives for the proposed anesthesia with the patient or authorized representative who has indicated his/her understanding and acceptance.       Plan Discussed with: Anesthesiologist  Anesthesia Plan Comments: (Labs reviewed. Platelets acceptable, patient not taking any blood thinning medications. Per RN, FHR tracing reported to be stable enough for sitting procedure. Risks and benefits discussed with patient, including PDPH, backache, epidural hematoma, failed epidural,  blood pressure changes, allergic reaction, and nerve injury. Patient expressed understanding and wished to proceed.)        Anesthesia Quick Evaluation

## 2020-01-29 NOTE — Discharge Summary (Addendum)
Postpartum Discharge Summary     Patient Name: Catherine Collins DOB: [birth year] MRN: [id number]  Date of admission: 01/29/2020 Delivering Provider: Clarnce Flock   Date of discharge: 01/31/2020  Admitting diagnosis: Labor and delivery, indication for care [O75.9] Intrauterine pregnancy: [redacted]w[redacted]d    Secondary diagnosis:  Active Problems:   History of depression   Late prenatal care   Gonorrhea affecting pregnancy in second trimester   History of prior pregnancy with IUGR newborn   History of tobacco use   Supervision of other normal pregnancy, antepartum  Additional problems: none     Discharge diagnosis: Term Pregnancy Delivered                                                                                                Post partum procedures: none  Augmentation: AROM  Complications: None  Hospital course:  Onset of Labor With Vaginal Delivery     24y.o. yo G2P1001 at 365w2das admitted in Active Labor on 01/29/2020. Patient had an uncomplicated labor course as follows: arrived at 6cm, progressed to 8cm and then had AROM for clear, progressed to complete and pushed for approximately 2 hours due to OP positioning of baby, achieving uncomplicated NSVD.  Membrane Rupture Time/Date: 9:09 AM ,01/29/2020   Intrapartum Procedures: Episiotomy: None [1]                                         Lacerations:  None [1]  Patient had a delivery of a Viable infant. 01/29/2020  Information for the patient's newborn:  FrZahava, Quant0[[id number]]Delivery Method: Vag-Spont     Pateint had an uncomplicated postpartum course.  She is ambulating, tolerating a regular diet, passing flatus, and urinating well. Patient is discharged home in stable condition on 01/31/20.  Delivery time: 3:35 PM    Magnesium Sulfate received: No BMZ received: No Rhophylac:N/A MMR:N/A Transfusion:No  Physical exam  Vitals:   01/30/20 0438 01/30/20 1355 01/30/20 2124 01/31/20 0511  BP: 109/70 119/61 (!)  95/53 112/73  Pulse: 77 97 84 75  Resp: '16 16 16 16  ' Temp: 98 F (36.7 C) 97.9 F (36.6 C) 98.5 F (36.9 C) 98.2 F (36.8 C)  TempSrc: Oral Axillary Oral Oral  SpO2: 99% 99%  99%   General: alert, cooperative and no distress Lochia: appropriate Uterine Fundus: firm Incision: N/A DVT Evaluation: No evidence of DVT seen on physical exam. No cords or calf tenderness. No significant calf/ankle edema. Labs: Lab Results  Component Value Date   WBC 20.8 (H) 01/30/2020   HGB 10.1 (L) 01/30/2020   HCT 30.5 (L) 01/30/2020   MCV 93.0 01/30/2020   PLT 224 01/30/2020   No flowsheet data found. Edinburgh Score: Edinburgh Postnatal Depression Scale Screening Tool 01/30/2020  I have been able to laugh and see the funny side of things. 0  I have looked forward with enjoyment to things. 1  I have blamed myself unnecessarily when things went wrong. 2  I have been  anxious or worried for no good reason. 3  I have felt scared or panicky for no good reason. 0  Things have been getting on top of me. 2  I have been so unhappy that I have had difficulty sleeping. 1  I have felt sad or miserable. 1  I have been so unhappy that I have been crying. 1  The thought of harming myself has occurred to me. 0  Edinburgh Postnatal Depression Scale Total 11    Discharge instruction: per After Visit Summary and "Baby and Me Booklet".  After visit meds:  Allergies as of 01/31/2020       Reactions   Betamethasone Rash        Medication List     TAKE these medications    acetaminophen 325 MG tablet Commonly known as: Tylenol Take 2 tablets (650 mg total) by mouth every 4 (four) hours as needed (for pain scale < 4).   Blood Pressure Monitor Kit 1 kit by Does not apply route once a week. Check BP weekly. Large Cuff DX: Z34.6   Comfort Fit Maternity Supp Sm Misc Wear as directed.   ibuprofen 600 MG tablet Commonly known as: ADVIL Take 1 tablet (600 mg total) by mouth every 6 (six) hours.   OB  Complete Petite 35-5-1-200 MG Caps Take 1 tablet by mouth daily.        Diet: routine diet  Activity: Advance as tolerated. Pelvic rest for 6 weeks.   Outpatient follow up:6 weeks Follow up Appt: Future Appointments  Date Time Provider [place]  02/26/2020  2:20 PM Leftwich-Kirby, Kathie Dike, CNM [facility] None  03/06/2020  2:00 PM Melvenia Beam, MD [facility] None   Follow up Visit:  Please schedule this patient for Postpartum visit in: 6 weeks with the following provider: Any provider Virtual For C/S patients schedule nurse incision check in weeks 2 weeks: no Low risk pregnancy complicated by: n/a Delivery mode:  SVD Anticipated Birth Control:  Condoms PP Procedures needed: n/a  Schedule Integrated [place] visit: no  Newborn Data: Live born female  Birth Weight:  3035g APGAR: 57, 9  Newborn Delivery   Birth date/time: 01/29/2020 15:35:00 Delivery type: Vaginal, Spontaneous      Baby Feeding: Bottle and Breast Disposition:home with mother   01/31/2020 Paulla Dolly, MD  GME ATTESTATION:  I saw and evaluated the patient. I agree with the findings and the plan of care as documented in the resident's note.  Merilyn Baba, DO OB Fellow, [place] for [place] 01/31/2020 8:27 AM

## 2020-01-29 NOTE — Progress Notes (Signed)
Patient did not answer nurse's telephone call. 

## 2020-01-30 LAB — CBC
HCT: 30.5 % — ABNORMAL LOW (ref 36.0–46.0)
Hemoglobin: 10.1 g/dL — ABNORMAL LOW (ref 12.0–15.0)
MCH: 30.8 pg (ref 26.0–34.0)
MCHC: 33.1 g/dL (ref 30.0–36.0)
MCV: 93 fL (ref 80.0–100.0)
Platelets: 224 10*3/uL (ref 150–400)
RBC: 3.28 MIL/uL — ABNORMAL LOW (ref 3.87–5.11)
RDW: 13.2 % (ref 11.5–15.5)
WBC: 20.8 10*3/uL — ABNORMAL HIGH (ref 4.0–10.5)
nRBC: 0 % (ref 0.0–0.2)

## 2020-01-30 NOTE — Progress Notes (Signed)
[name] was referred for history of depression/anxiety.  * Referral screened out by Clinical Social Worker because none of the following criteria appear to apply:  ~ History of anxiety/depression during this pregnancy, or of post-partum depression following prior delivery. ~ Diagnosis of anxiety and/or depression within last 3 years OR * [name]'s symptoms currently being treated with medication and/or therapy.  Please contact the Clinical Social Worker if needs arise, by [name] request, or if [name] scores greater than 9/yes to question 10 on Edinburgh Postpartum Depression Screen.  Desiderio Dolata, LCSW [facility] [phone]  

## 2020-01-30 NOTE — Progress Notes (Addendum)
Patient ID: SHAENA PARKERSON, female   DOB: September 15, 1996, 24 y.o.   MRN: [id number]  POSTPARTUM PROGRESS NOTE  Post Partum Day 1  Subjective:  YURIDIA COUTS is a 24 y.o. [id number] s/p NSVD at [redacted]w[redacted]d.  She reports she is doing well. No acute events overnight. She denies any problems with ambulating, voiding or po intake. No flatulence or BM yet. Denies nausea or vomiting.  Pain is well controlled.  Lochia is appropriate.  Objective: Blood pressure 109/70, pulse 77, temperature 98 F (36.7 C), temperature source Oral, resp. rate 16, last menstrual period 03/02/2019, SpO2 99 %, unknown if currently breastfeeding.  Physical Exam:  General: alert, cooperative and no distress Chest: no respiratory distress Heart:regular rate, distal pulses intact Abdomen: soft, nontender Uterine Fundus: firm, appropriately tender DVT Evaluation: No calf swelling or tenderness Extremities: No edema Skin: warm, dry  Recent Labs    01/29/20 0550 01/30/20 0602  HGB 10.9* 10.1*  HCT 34.3* 30.5*    Assessment/Plan: TECORA EUSTACHE is a 24 y.o. [id number] s/p NSVD at [redacted]w[redacted]d   PPD#1 - Doing well. Continue routine postpartum care.  Contraception: condoms/undecided Feeding: bottle with plan to pump Dispo: Plan for discharge tomorrow.   LOS: 1 day    Aldean Jewett MD, PGY-1 OBGYN Faculty Teaching Service  01/30/2020, 7:27 AM   I saw and evaluated the patient. I agree with the findings and the plan of care as documented in the resident's note.  Jerilynn Birkenhead, MD [facility] Family Medicine Fellow, [facility] for [employer], [facility]

## 2020-01-30 NOTE — Anesthesia Postprocedure Evaluation (Signed)
Anesthesia Post Note  Patient: Catherine Collins  Procedure(s) Performed: AN AD HOC LABOR EPIDURAL     Patient location during evaluation: Mother Baby Anesthesia Type: Epidural Level of consciousness: awake Pain management: satisfactory to patient Vital Signs Assessment: post-procedure vital signs reviewed and stable Respiratory status: spontaneous breathing Cardiovascular status: stable Anesthetic complications: no    Last Vitals:  Vitals:   01/29/20 2327 01/30/20 0438  BP: (!) 106/58 109/70  Pulse: 85 77  Resp: 16 16  Temp: 36.7 C 36.7 C  SpO2: 99% 99%    Last Pain:  Vitals:   01/30/20 0703  TempSrc:   PainSc: Asleep   Pain Goal: Patients Stated Pain Goal: 2 (01/29/20 1855)                 Cephus Shelling

## 2020-01-30 NOTE — Lactation Note (Signed)
This note was copied from a baby's chart. Lactation Consultation Note  Patient Name: Catherine Collins [id number]'N Date: 01/30/2020 Reason for consult: Follow-up assessment  P2 mother whose infant is now 52 hours old.  This is a term baby.  Mother breast fed her first child(now one year old) for a few days and then continued to pump and bottle feed for three months.  Mother does not wish to latch this baby to the breast.  Her feeding preference is to pump and bottle feed using formula until she has enough EBM to feed baby.  Mother had recently pumped 10 mls of EBM and bottle fed baby.  He was asleep on her chest when I arrived.  Mother had no questions/concerns related to pumping.  She knows to pump every three hours.  Mother does not have a DEBP for home use.  She is a [facility] participant in [employer].  I suggested she call now to facilitate obtaining a DEBP.  Referral sent.  Mother was planning on buying a $40 pump from [employer], however, I highly encouraged her not to do this citing reasons why and suggested she obtain the [facility] pump instead.  Mother will follow through with this.  Support person not present at this time.   Maternal Data    Feeding Feeding Type: Bottle Fed - Formula Nipple Type: Slow - flow  LATCH Score                   Interventions    Lactation Tools Discussed/Used     Consult Status Consult Status: Follow-up Date: 01/31/20 Follow-up type: In-patient    Dora Sims 01/30/2020, 12:38 PM

## 2020-01-31 MED ORDER — IBUPROFEN 600 MG PO TABS: 600.0000 mg | ORAL_TABLET | Freq: Four times a day (QID) | ORAL | 0 refills | Status: DC

## 2020-01-31 MED ORDER — SERTRALINE HCL 50 MG PO TABS: 50.0000 mg | ORAL_TABLET | Freq: Every day | ORAL | 2 refills | Status: DC

## 2020-01-31 MED ORDER — ACETAMINOPHEN 325 MG PO TABS: 650.0000 mg | ORAL_TABLET | ORAL | 0 refills | Status: DC | PRN

## 2020-01-31 NOTE — Clinical Social Work Maternal (Signed)
CLINICAL SOCIAL WORK MATERNAL/CHILD NOTE  Patient Details  Name: Catherine Collins MRN: [id number] Date of Birth: 14-Jan-1996  Date:  01/31/2020  Clinical Social Worker Initiating Note:  Elijio Miles Date/Time: Initiated:  01/31/20/0924     Child's Name:  Catherine Collins   Biological Parents:  Mother, Father(Rhaya Tondreau and Zollie Scale DOB: 10/16/1995)   Need for Interpreter:  None   Reason for Referral:  [place] Concerns, Other (Comment)(Edinburgh 11)   Address:  [address] [place] [address]    Phone number:  [phone] (home)     Additional phone number:   Household Members/Support Persons (HM/SP):   Household Member/Support Person 1, Household Member/Support Person 2   HM/SP Name Relationship DOB or Age  HM/SP -1   Grandma    HM/SP -2   Grandpa    HM/SP -3        HM/SP -4        HM/SP -5        HM/SP -6        HM/SP -7        HM/SP -8          Natural Supports (not living in the home):  Extended Family   Professional Supports: None   Employment: Full-time   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)  [place]      Pediatrician Fax Number:    Risk Factors/Current Problems:      Cognitive State:  Able to Concentrate , Alert , Linear Thinking    Mood/Affect:  Calm , Comfortable , Interested    CSW Assessment:  CSW received consult for history of anxiety and depression. Consult  initially screened out as diagnosis dates back more than 3 years ago. CSW received new consult for Edinburgh of 11. CSW met with [name] to offer support and complete assessment.    [name] resting in bed holding infant to chest, when CSW entered the room. CSW introduced self and explained reason for consult to which [name] expressed understanding. [name] pleasant and engaged throughout assessment. [name] reported she currently lives with her grandma and grandpa and is employed at both [employer] and [employer]. [name] confirmed she receives [facility] and has already updated them of her delivery. CSW inquired about [name]'s mental health history and [name] acknowledged first noticing symptoms of anxiety around the age of 17. [name] stated she was later formally diagnosed with bipolar depression and anxiety in 2016. [name] stated she was on medications for about two years before she became pregnant with her first child and has not been restarted on medications since. [name] acknowledged feelings of anxiety throughout her pregnancy and shared an interest in being restarted on medications. Per [name], she was previously on Zoloft and then was switched to Effexor. CSW received verbal permission from [name] to reach out to MD regarding getting [name] started on medications prior  to discharge. CSW also stated she would provide [name] with a list of resources for local psychiatry services so that [name] could follow up for further medication management. [name] also acknowledged some PPD/A after her first child was born. CSW provided education regarding the baby blues period vs. perinatal mood disorders, discussed treatment and gave resources for mental health follow up if concerns arise. CSW recommends self-evaluation during the postpartum time period using the New Mom Checklist from Postpartum Progress and encouraged [name] to contact a medical professional if symptoms are noted at any time. [name] did not appear to be displaying any acute mental health symptoms and denied any  current SI, HI or DV. When asked how [name] was currently feeling mentally and emotionally, [name] reported she was "all right" and that her mood was "neutral". CSW addressed score of 11 on Edinburgh Depression Screen and [name] stated she based her answers on how she was feeling in general and during her pregnancy. [name] reported having good support from her grandmother.  [name] confirmed having all essential items for infant once discharged and stated infant would be sleeping in a bassinet or crib once home. CSW provided review of Sudden Infant Death Syndrome (SIDS) precautions and safe sleeping habits.   CSW Plan/Description:  No Further Intervention Required/No Barriers to Discharge, Sudden Infant Death Syndrome (SIDS) Education, Perinatal Mood and Anxiety Disorder (PMADs) Education    [place], LCSW 01/31/2020, 9:49 AM

## 2020-01-31 NOTE — Discharge Instructions (Signed)

## 2020-01-31 NOTE — Lactation Note (Signed)
This note was copied from a baby's chart. Lactation Consultation Note  Patient Name: Catherine Collins NOTRR'N Date: 01/31/2020 Reason for consult: Follow-up assessment;Infant weight loss;Term Baby is 104 hours old  As LC entered the room baby latched with shallow latch and mom released the suction.  LC assisted to latch with depth in the side lying position and per mom comfortable.  LC showed mom how to compress breast to enhance flow. Baby fed for 15 mins and then re- latched on the other breast for 10 mins .  After wet diaper changed still hungry and latched for the 3rd time and was still feeding . Per mom comfortable at the breast , just some cramping.  LC recommended to mom prior to feeding for the next 72 hours empty bladder 1st and the cramping should improve.  Mom denies soreness. Sore nipple and engorgement prevention and tx reviewed.  LC instructed mom on the use of the hand pump and she has the DEBP kit if needing to call [facility] .  Per mom active with [facility] .  Mom seemed excited this baby is feeding so much better than her 1st baby.  Mom has the [facility] pamphlet with phone numbers.   Maternal Data Has patient been taught Hand Expression?: Yes  Feeding Feeding Type: Breast Fed  LATCH Score Latch: Grasps breast easily, tongue down, lips flanged, rhythmical sucking.  Audible Swallowing: Spontaneous and intermittent  Type of Nipple: Everted at rest and after stimulation  Comfort (Breast/Nipple): Filling, red/small blisters or bruises, mild/mod discomfort  Hold (Positioning): No assistance needed to correctly position infant at breast.  LATCH Score: 9  Interventions Interventions: Breast feeding basics reviewed;Skin to skin;Breast compression;Adjust position;Support pillows;Position options  Lactation Tools Discussed/Used Tools: Pump Breast pump type: Manual;Double-Electric Breast Pump WIC Program: Yes Pump Review: Milk Storage;Setup, frequency, and cleaning   Consult  Status Consult Status: Complete Date: 01/31/20    Myer Haff 01/31/2020, 10:51 AM

## 2020-01-31 NOTE — Progress Notes (Signed)
SW called saying the patient has had a history of depression. She has not been previously diagnosed with Bipolar Disorder. She would like to be started on something again. She had been on Zoloft and Effexor in the past. Sent a prescription for Zoloft 50mg  QHS. She will be set up with a [facility] appointment by the clinic.  [facility], MD 9:58 AM 01/31/20

## 2020-02-01 DIAGNOSIS — Z3483 Encounter for supervision of other normal pregnancy, third trimester: Secondary | ICD-10-CM

## 2020-02-13 ENCOUNTER — Ambulatory Visit (INDEPENDENT_AMBULATORY_CARE_PROVIDER_SITE_OTHER): Payer: Medicaid Other | Admitting: Licensed Clinical Social Worker

## 2020-02-13 DIAGNOSIS — F419 Anxiety disorder, unspecified: Secondary | ICD-10-CM

## 2020-02-13 DIAGNOSIS — O99340 Other mental disorders complicating pregnancy, unspecified trimester: Secondary | ICD-10-CM | POA: Diagnosis not present

## 2020-02-13 NOTE — BH Specialist Note (Signed)
Integrated Behavioral Health Initial Visit  MRN: [id number] Name: Catherine Collins  Number of Integrated Behavioral Health Clinician visits:: 1 Session Start time: 10:18am  Session End time: 10:48am Total time: 30 minutes   Type of Service: Integrated Behavioral Health- Individual Interpretor:no  Interpretor Name and Language: none    Warm Hand Off Completed.       SUBJECTIVE: Catherine Collins is a 24 y.o. female accompanied by  Patient was referred by H. Sparacino MD for mood check high edinburgh score 11 Patient reports history of anxiety Duration of problem: since age 58 ; Severity of problem: mild   OBJECTIVE: Mood: good  and Affect: Euthymic Risk of harm to self or others: No risk of harm to self or others   LIFE CONTEXT: Family and Social: Limited family support according to Catherine Collins  School/Work: Catherine Collins  Self-Care: n/a Life Changes: Newborn   GOALS ADDRESSED: Patient will: 1. Reduce symptoms of: worry, feelings of guilt  2. Increase knowledge and/or ability of:  anxiety 3. Demonstrate ability to: self manage and identify triggering symptoms   INTERVENTIONS: Interventions utilized: brief supportive counseling   Standardized Assessments completed: edinburgh completed 01/30/2020  ASSESSMENT: Patient currently experiencing anxiety affecting pregnancy   Patient may benefit from integrated behavioral health   PLAN: 1. Follow up with behavioral health clinician on : 3 weeks  2. Behavioral recommendations: prioritize sleep, increase social interaction, practice positive affirmations, and collaborate with Catherine Collins behavioral health 3. Referral(s): none  4. "From scale of 1-10, how likely are you to follow plan?":   Catherine Saxon, LCSW

## 2020-02-26 ENCOUNTER — Telehealth: Payer: Medicaid Other | Admitting: Advanced Practice Midwife

## 2020-02-28 ENCOUNTER — Telehealth: Payer: Medicaid Other | Admitting: Obstetrics

## 2020-02-28 ENCOUNTER — Encounter: Payer: Self-pay | Admitting: Obstetrics

## 2020-02-28 DIAGNOSIS — O99345 Other mental disorders complicating the puerperium: Secondary | ICD-10-CM

## 2020-02-28 DIAGNOSIS — F53 Postpartum depression: Secondary | ICD-10-CM

## 2020-02-28 NOTE — Progress Notes (Signed)
    Post Partum Visit Note  Catherine Collins is a 24 y.o. G52P2002 female who presents for a postpartum visit. She is 4 weeks postpartum following a normal spontaneous vaginal delivery.  I have fully reviewed the prenatal and intrapartum course. The delivery was at 39 gestational weeks.  Anesthesia: epidural. Postpartum course has been unremarkable. Baby is doing well  Baby is feeding by Bottle Gerber Gentle . Bleeding staining only. Bowel function is normal. Bladder function is normal. Patient is not sexually active. Contraception method is none. Postpartum depression screening: positive. EPDS : 13  The following portions of the patient's history were reviewed and updated as appropriate: allergies, current medications, past family history, past medical history, past social history, past surgical history and problem list.  Review of Systems Behavioral/Psych: positive for depression    Objective:  Last menstrual period 03/02/2019, unknown if currently breastfeeding. PE:         General: Alert and no distress         Respiratory:  Normal respiratory effort         Mental:  Normal affect and mood  The remainder of the physical exam was deferred because of the nature of the encounter.           Assessment:   1. Postpartum care following vaginal delivery - doing well  2. Postpartum depression - Zoloft Rx.  Counseling offered, but declined.   Plan:   Essential components of care per ACOG recommendations:  1.  Mood and well being: Patient with positive depression screening today. Reviewed local resources for support.  - Patient does not use tobacco. If using tobacco we discussed reduction and for recently cessation risk of relapse - hx of drug use? No   If yes, discussed support systems  2. Infant care and feeding:  -Patient currently breastmilk feeding? No If breastmilk feeding discussed return to work and pumping. If needed, patient was provided letter for work to allow for every 2-3  hr pumping breaks, and to be granted a private location to express breastmilk and refrigerated area to store breastmilk. Reviewed importance of draining breast regularly to support lactation. -Social determinants of health (SDOH) reviewed in EPIC. No concerns The following needs were identified  3. Sexuality, contraception and birth spacing - Patient does not want a pregnancy in the next year.  Desired family size is 2 children.  - Reviewed forms of contraception in tiered fashion. Patient desired no method today.   - Discussed birth spacing of 18 months  4. Sleep and fatigue -Encouraged family/partner/community support of 4 hrs of uninterrupted sleep to help with mood and fatigue  5. Physical Recovery  - Discussed patients delivery vaginally and complications - Patient had no lacerations - Patient has urinary incontinence? No  - Patient is safe to resume physical and sexual activity  6.  Health Maintenance - Last pap smear done N/a Due to age  and was Not done due to age       Kennon Portela, [facility] [employer], [facility]  Brock Bad, MD 02/28/2020 2:08 PM
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
## 2020-03-06 ENCOUNTER — Ambulatory Visit: Payer: Medicaid Other | Admitting: Neurology

## 2020-03-07 ENCOUNTER — Encounter: Payer: Self-pay | Admitting: Neurology

## 2020-03-12 ENCOUNTER — Ambulatory Visit (INDEPENDENT_AMBULATORY_CARE_PROVIDER_SITE_OTHER): Payer: Medicaid Other

## 2020-03-12 ENCOUNTER — Other Ambulatory Visit: Payer: Self-pay

## 2020-03-12 DIAGNOSIS — R3915 Urgency of urination: Secondary | ICD-10-CM | POA: Diagnosis not present

## 2020-03-12 LAB — POCT URINALYSIS DIPSTICK
Bilirubin, UA: NEGATIVE
Blood, UA: NEGATIVE
Glucose, UA: NEGATIVE
Ketones, UA: NEGATIVE
Nitrite, UA: POSITIVE
Protein, UA: NEGATIVE
Spec Grav, UA: 1.02 (ref 1.010–1.025)
Urobilinogen, UA: 0.2 E.U./dL
pH, UA: 6.5 (ref 5.0–8.0)

## 2020-03-12 MED ORDER — PHENAZOPYRIDINE HCL 200 MG PO TABS: 200.0000 mg | ORAL_TABLET | Freq: Three times a day (TID) | ORAL | 0 refills | Status: DC | PRN

## 2020-03-12 MED ORDER — NITROFURANTOIN MONOHYD MACRO 100 MG PO CAPS: 100.0000 mg | ORAL_CAPSULE | Freq: Two times a day (BID) | ORAL | 0 refills | Status: DC

## 2020-03-12 NOTE — Progress Notes (Addendum)
Catherine Collins is here to leave a urine sample.  Pt reports having urinary urgency and frequency with odor for the past 2 days.  She stated that she thought she was starting a period due to abdominal cramping.  Urinalysis showed small leuk's. Results pending. -EH/RMA  Will treat dysuria with pyridium, urine Cx obtained, will treat w/ abx depending on result.   Macrobid started based on Urine Cx.

## 2020-03-14 LAB — URINE CULTURE

## 2020-03-15 ENCOUNTER — Other Ambulatory Visit: Payer: Self-pay | Admitting: Obstetrics & Gynecology

## 2020-03-15 MED ORDER — SULFAMETHOXAZOLE-TRIMETHOPRIM 800-160 MG PO TABS: 1.0000 | ORAL_TABLET | Freq: Two times a day (BID) | ORAL | 0 refills | Status: AC

## 2020-03-27 ENCOUNTER — Other Ambulatory Visit: Payer: Self-pay

## 2020-03-27 MED ORDER — SULFAMETHOXAZOLE-TRIMETHOPRIM 400-80 MG PO TABS: 1.0000 | ORAL_TABLET | Freq: Two times a day (BID) | ORAL | 0 refills | Status: DC

## 2020-03-28 ENCOUNTER — Ambulatory Visit: Payer: Medicaid Other

## 2020-03-29 IMAGING — US US MFM OB FOLLOW-UP
1 series · 14 of 28 positions shown · non-contrast
Comparison: none

[Series 1: us mfm ob follow-up · 63 acquisitions, 14 frames shown]
[im 3/63]
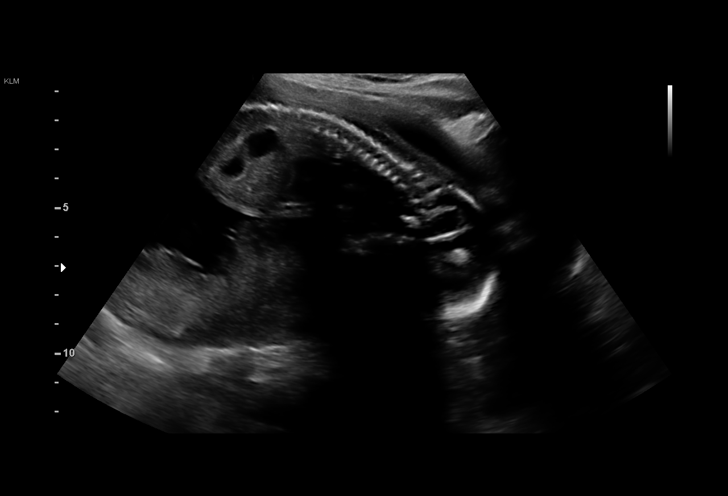
[im 7/63]
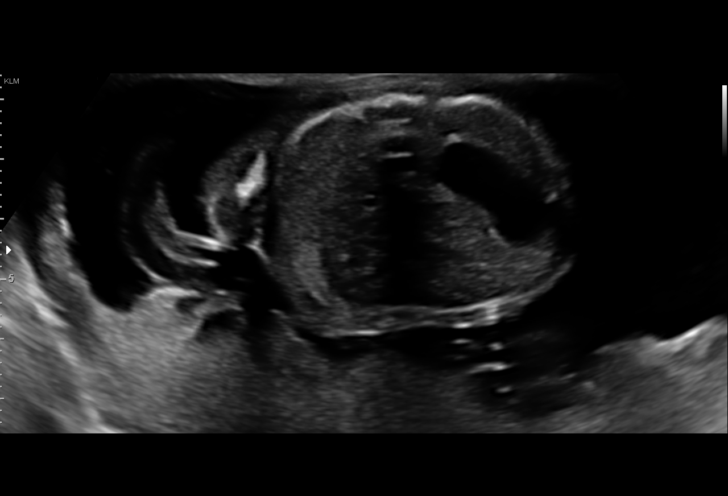
[im 12/63]
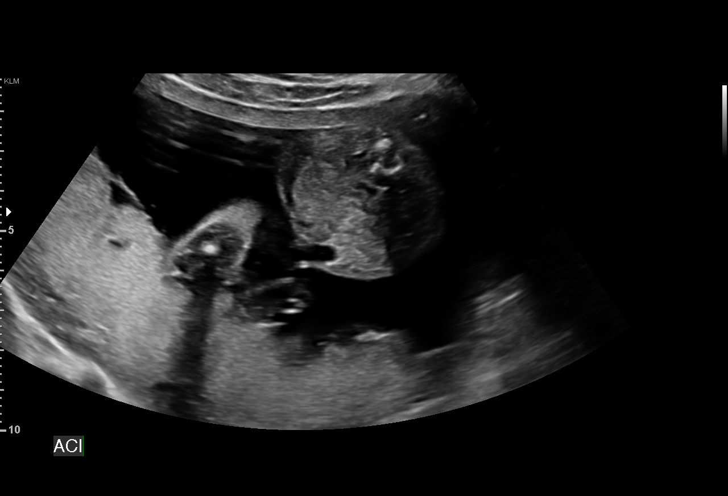
[im 17/63]
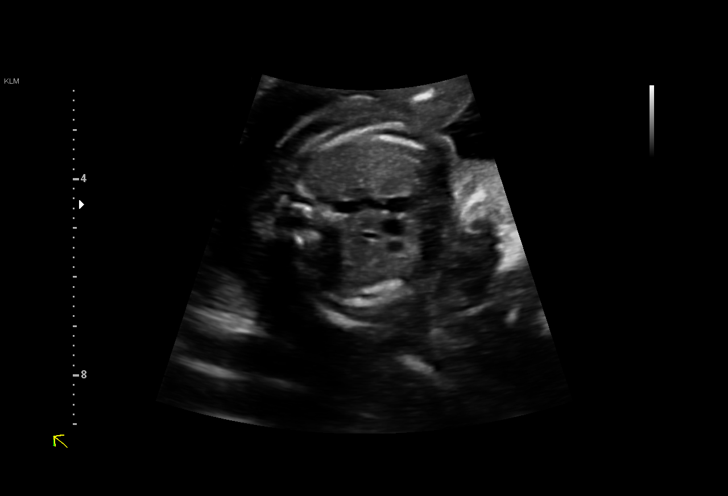
[im 21/63]
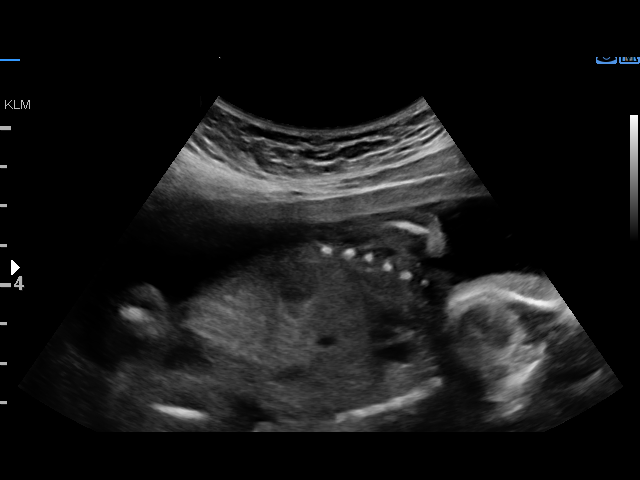
[im 26/63]
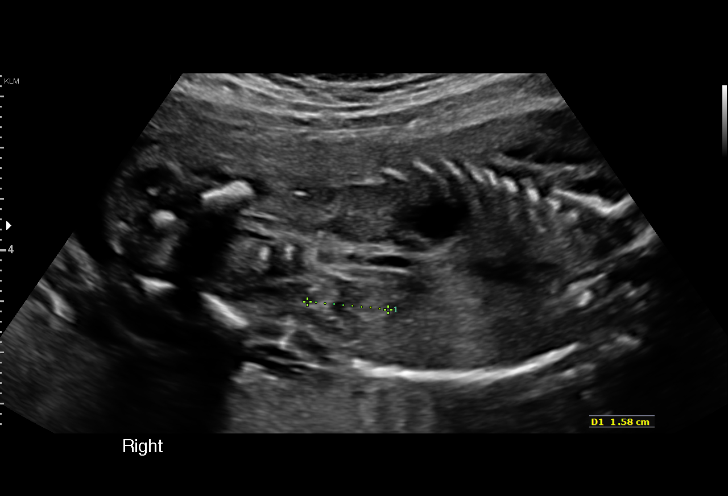
[im 30/63]
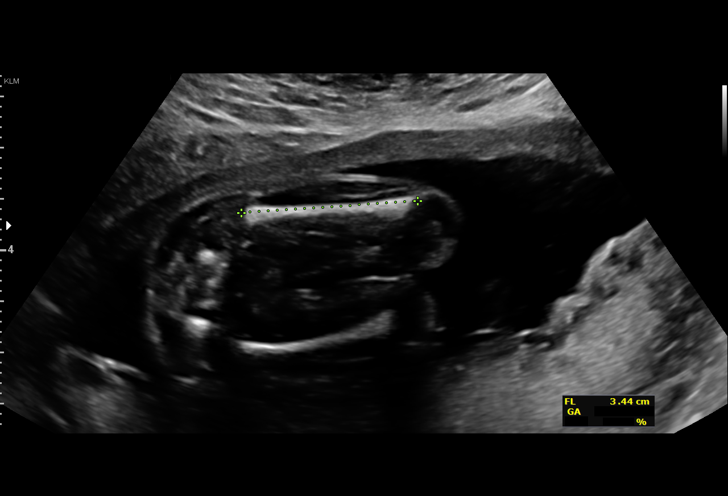
[im 35/63]
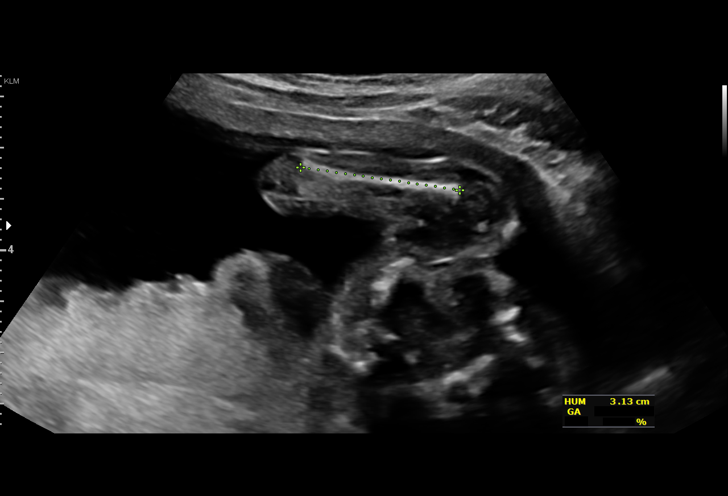
[im 40/63]
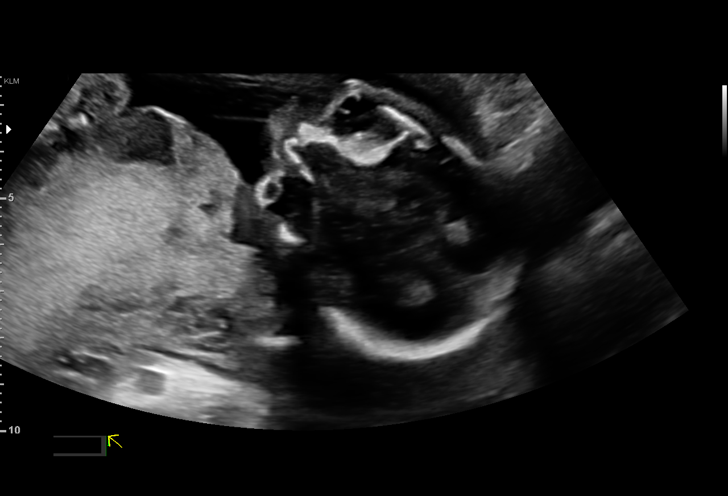
[im 44/63]
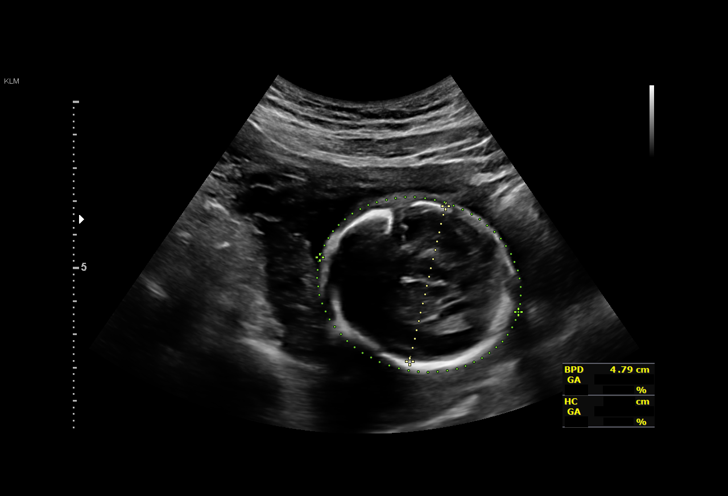
[im 49/63]
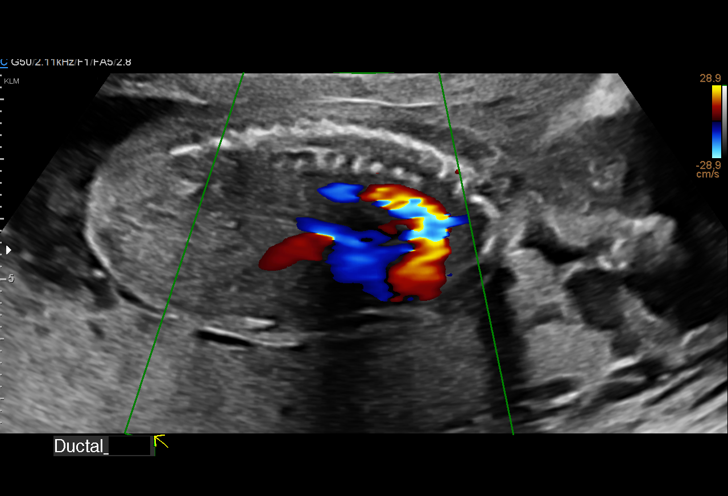
[im 53/63]
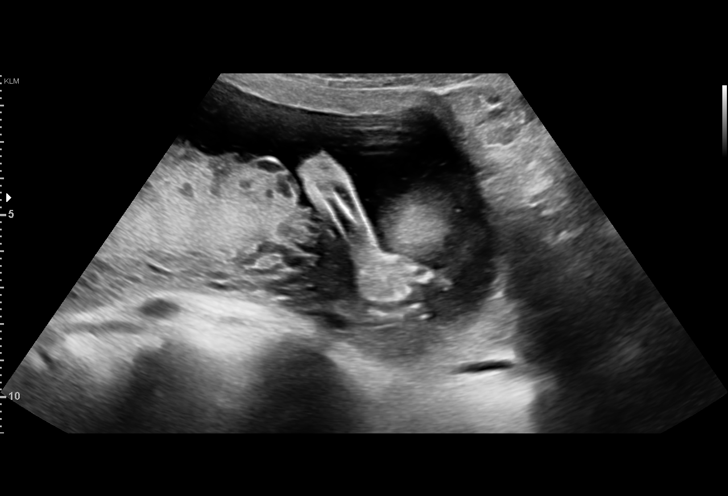
[im 58/63]
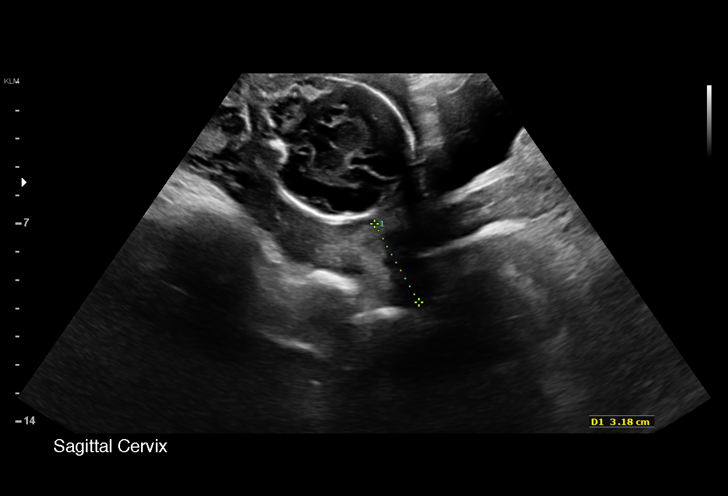
[im 63/63]
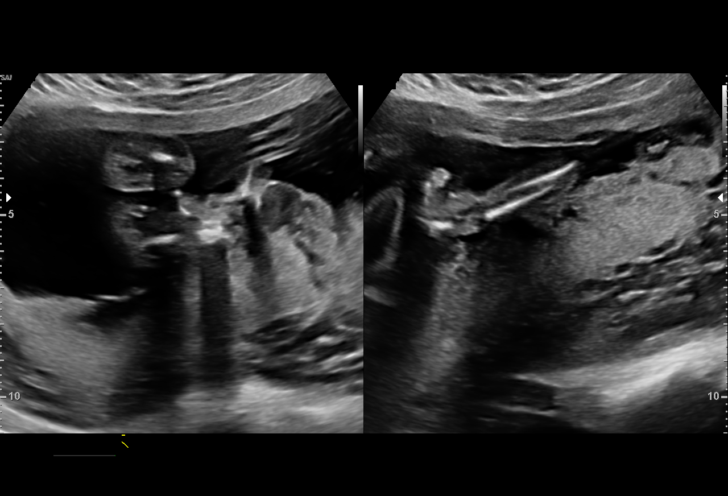

[14 of 28 positions shown; findings below may reference images not displayed]

Road [[email or web address]]

Indications

20 weeks gestation of pregnancy
Antenatal follow-up for nonvisualized fetal
anatomy
OB History

Gravidity:    1
Fetal Evaluation

Num Of Fetuses:     1
Fetal Heart         146
Rate(bpm):
Cardiac Activity:   Observed
Presentation:       Cephalic

Amniotic Fluid
AFI FV:      Subjectively within normal limits
Biometry

BPD:      48.4  mm     G. Age:  20w 4d         40  %    CI:         75.9   %    70 - 86
FL/HC:      19.4   %    15.9 -
HC:      176.1  mm     G. Age:  20w 1d         14  %    HC/AC:      1.17        1.06 -
AC:      149.9  mm     G. Age:  20w 1d         24  %    FL/BPD:     70.7   %
FL:       34.2  mm     G. Age:  20w 5d         38  %    FL/AC:      22.8   %    20 - 24
HUM:        32  mm     G. Age:  20w 5d         44  %
CER:      21.1  mm     G. Age:  20w 1d         33  %

Est. FW:     354  gm    0 lb 12 oz      36  %
Gestational Age
LMP:           24w 5d        Date:  11/24/17                 EDD:   08/31/18
U/S Today:     20w 3d                                        EDD:   09/30/18
Best:          20w 6d     Det. By:  U/S  (04/08/18)          EDD:   09/27/18
Anatomy

Cranium:               Appears normal         Aortic Arch:            Appears normal
Cavum:                 Appears normal         Ductal Arch:            Appears normal
Ventricles:            Appears normal         Diaphragm:              Appears normal
Choroid Plexus:        Appears normal         Stomach:                Appears normal, left
sided
Cerebellum:            Appears normal         Abdomen:                Appears normal
Posterior Fossa:       Appears normal         Abdominal Wall:         Appears nml (cord
insert, abd wall)
Nuchal Fold:           Not applicable (>20    Cord Vessels:           Appears normal (3
wks GA)                                        vessel cord)
Face:                  Profile previously     Kidneys:                Appear normal
seen, orbits NL
Lips:                  Appears normal         Bladder:                Appears normal
Thoracic:              Appears normal         Spine:                  Appears normal
Heart:                 Appears normal         Upper Extremities:      Previously seen
(4CH, axis, and situs
RVOT:                  Appears normal         Lower Extremities:      Previously seen
LVOT:                  Appears normal

Other:  Heels visualized. Hands visualized.
Cervix Uterus Adnexa

Cervix
Length:            3.2  cm.
Normal appearance by transabdominal scan.

Left Ovary
Size(cm)       2.5  x   1.5    x  2.2       Vol(ml):
Within normal limits.

Right Ovary
Size(cm)        3   x    2     x  2         Vol(ml):
Dermoid visualize.

Cul De Sac:   No free fluid seen.

Adnexa:       No abnormality visualized.
Impression

Singleton intrauterine pregnancy at 20+6 weeks here for
completion of anatomic survey
Interval review of the anatomy shows no sonographic
markers for aneuploidy or structural anomalies
All relevant fetal anatomy has been visualized
Amniotic fluid volume is normal
Estimated fetal weight shows growth in the 36th percentile
Recommendations

Follow-up ultrasounds as clinically indicated.

## 2020-04-02 ENCOUNTER — Encounter: Payer: Medicaid Other | Admitting: Licensed Clinical Social Worker

## 2020-04-17 ENCOUNTER — Ambulatory Visit: Payer: Medicaid Other | Admitting: Certified Nurse Midwife

## 2020-04-19 ENCOUNTER — Telehealth: Payer: Self-pay

## 2020-04-19 MED ORDER — SULFAMETHOXAZOLE-TRIMETHOPRIM 400-80 MG PO TABS: 1.0000 | ORAL_TABLET | Freq: Two times a day (BID) | ORAL | 0 refills | Status: DC

## 2020-04-19 NOTE — Telephone Encounter (Signed)
Patient called and stated that she did not finish previous antibiotics for uti and symptoms are worse. Sent rx per provider approval.

## 2020-04-24 ENCOUNTER — Ambulatory Visit: Payer: Medicaid Other | Admitting: Advanced Practice Midwife

## 2020-06-11 ENCOUNTER — Other Ambulatory Visit: Payer: Self-pay

## 2020-06-11 ENCOUNTER — Telehealth: Payer: Self-pay

## 2020-06-11 DIAGNOSIS — N898 Other specified noninflammatory disorders of vagina: Secondary | ICD-10-CM

## 2020-06-11 MED ORDER — METRONIDAZOLE 500 MG PO TABS: 500.0000 mg | ORAL_TABLET | Freq: Two times a day (BID) | ORAL | 0 refills | Status: DC

## 2020-06-11 NOTE — Telephone Encounter (Signed)
Rx sent for pt with c/o BV sx's Per protocol

## 2020-07-31 ENCOUNTER — Encounter: Payer: Self-pay | Admitting: Women's Health

## 2020-07-31 ENCOUNTER — Ambulatory Visit (INDEPENDENT_AMBULATORY_CARE_PROVIDER_SITE_OTHER): Payer: Medicaid Other | Admitting: Women's Health

## 2020-07-31 ENCOUNTER — Other Ambulatory Visit (HOSPITAL_COMMUNITY)
Admission: RE | Admit: 2020-07-31 | Discharge: 2020-07-31 | Disposition: A | Discharge status: Home / Self Care | Payer: Medicaid Other | Source: Ambulatory Visit | Attending: Women's Health | Admitting: Women's Health

## 2020-07-31 ENCOUNTER — Other Ambulatory Visit: Payer: Self-pay

## 2020-07-31 VITALS — BP 120/84 | HR 75 | Ht 62.0 in | Wt 118.0 lb

## 2020-07-31 DIAGNOSIS — N926 Irregular menstruation, unspecified: Secondary | ICD-10-CM

## 2020-07-31 DIAGNOSIS — Z113 Encounter for screening for infections with a predominantly sexual mode of transmission: Secondary | ICD-10-CM | POA: Insufficient documentation

## 2020-07-31 DIAGNOSIS — Z01419 Encounter for gynecological examination (general) (routine) without abnormal findings: Secondary | ICD-10-CM | POA: Diagnosis present

## 2020-07-31 LAB — POCT URINE PREGNANCY: Preg Test, Ur: NEGATIVE

## 2020-07-31 NOTE — Progress Notes (Signed)
GYNECOLOGY ANNUAL PREVENTATIVE CARE ENCOUNTER NOTE  History:     Catherine Collins is a 24 y.o. G49P2002 female here for a routine annual gynecologic exam.  Current complaints: none.   Denies abnormal vaginal bleeding, discharge, pelvic pain, problems with intercourse or other gynecologic concerns. Pt requests STD testing today, but declines blood work. Pt reports she does perform SBE. Pt denies bowel or bladder concerns. No family hx of breast, colon, endometrial or ovarian cancer. Pt does not use drugs. Pt does smoke cigarettes and drinks socially. Patient reports she smokes about one pack per week. Last dental exam: unsure. Last eye exam: recent.      Gynecologic History Patient's last menstrual period was 06/09/2020 (exact date). Menstruation: every 2 months, 5-7 days, moderate bleeding, mild dysmenorrhea. Contraception: condoms sometimes. Pt reports she does not desire pregnancy in the next year. Patient reports she has used the Depo-Provera injection in the past, [name] IUD, OCPs, NuvaRing. Patient did not like IUD and repots it got imbedded, pt repots irregular bleeding with Depo, reports OCPs made her feel like she was on her period all the time but reports she was only on them for one month. Pt reports she did like the NuvaRing, but was atraid it would get lost in her vagina. Pt reports she will have to think about going back on the NuvaRing. Last Pap: n/a   Obstetric History OB History  Gravida Para Term Preterm AB Living  '2 2 2     2  ' SAB TAB Ectopic Multiple Live Births        0 2    # Outcome Date GA Lbr Len/2nd Weight Sex Delivery Anes PTL Lv  2 Term 01/29/20 [redacted]w[redacted]d/ 03:10 6 lb 11.1 oz (3.035 kg) M Vag-Spont EPI  LIV  1 Term 09/14/18 364w1d1:30 / 01:23 6 lb 3.1 oz (2.81 kg) F Vag-Spont EPI  LIV    Past Medical History:  Diagnosis Date  . Anemia   . Anxiety   . Depression   . Mental disorder   . Supervision of other normal pregnancy, antepartum 07/13/2019     Nursing Staff Provider Office Location  [facility] Dating  Uncertain [facility]/03anguage   ENGLISH Anatomy [place] Flu Vaccine   Genetic Screen  NIPS: Low risk Female  AFP:    TDaP vaccine    Hgb A1C or  GTT Early  Third trimester  Rhogam  NA   LAB RESULTS  Feeding Plan  both Blood Type B/Positive/-- (08/13 1437)  Contraception  Condoms Antibody Negative (08/13 1437) Circumcision  yes if boy Rubella    Past Surgical History:  Procedure Laterality Date  . WISDOM TOOTH EXTRACTION      Current Outpatient Medications on File Prior to Visit  Medication Sig Dispense Refill  . acetaminophen (TYLENOL) 325 MG tablet Take 2 tablets (650 mg total) by mouth every 4 (four) hours as needed (for pain scale < 4). (Patient not taking: Reported on 07/31/2020) 30 tablet 0  . Blood Pressure Monitor KIT 1 kit by Does not apply route once a week. Check BP weekly. Large Cuff DX: Z34.6 1 kit 0  . Elastic Bandages & Supports (COMFORT FIT MATERNITY SUPP SM) MISC Wear as directed. 1 each 0  . ibuprofen (ADVIL) 600 MG tablet Take 1 tablet (600 mg total) by mouth every 6 (six) hours. (Patient not taking: Reported on 07/31/2020) 30 tablet 0  . phenazopyridine (PYRIDIUM) 200 MG tablet Take 1 tablet (200 mg total)  by mouth 3 (three) times daily as needed for pain. (Patient not taking: Reported on 07/31/2020) 9 tablet 0  . sertraline (ZOLOFT) 50 MG tablet Take 1 tablet (50 mg total) by mouth at bedtime. (Patient not taking: Reported on 07/31/2020) 30 tablet 2  . sulfamethoxazole-trimethoprim (BACTRIM) 400-80 MG tablet Take 1 tablet by mouth 2 (two) times daily. (Patient not taking: Reported on 07/31/2020) 14 tablet 0   No current facility-administered medications on file prior to visit.    Allergies  Allergen Reactions  . Betamethasone Rash    Social History:  reports that she quit smoking about 2 years ago. Her smoking use included cigarettes. She smoked 0.50 packs per day. She has never used smokeless tobacco. She reports previous  alcohol use. She reports that she does not use drugs.  Family History  Problem Relation Age of Onset  . Drug abuse Maternal Grandfather   . Diabetes Maternal Grandfather   . Hypertension Maternal Grandfather   . Vision loss Maternal Grandfather   . Cancer Maternal Grandmother   . COPD Maternal Grandmother     The following portions of the patient's history were reviewed and updated as appropriate: allergies, current medications, past family history, past medical history, past social history, past surgical history and problem list.  Review of Systems Pertinent items noted in HPI and remainder of comprehensive ROS otherwise negative.  Physical Exam:  BP 120/84   Pulse 75   Ht '5\' 2"'  (1.575 m)   Wt 118 lb (53.5 kg)   LMP 06/09/2020 (Exact Date)   BMI 21.58 kg/m  CONSTITUTIONAL: Well-developed, well-nourished female in no acute distress.  HENT:  Normocephalic, atraumatic, External right and left ear normal. EYES: Conjunctivae and EOM are normal. Pupils are equal, round, and reactive to light. No scleral icterus.  NECK: Normal range of motion, supple, no masses.  Normal thyroid.  SKIN: Skin is warm and dry. No rash noted. Not diaphoretic. No erythema. No pallor. MUSCULOSKELETAL: Normal range of motion. No tenderness.  No cyanosis, clubbing, or edema. NEUROLOGIC: Alert and oriented to person, place, and time. Normal reflexes, muscle tone coordination. PSYCHIATRIC: Normal mood and affect. Normal behavior. Normal judgment and thought content. CARDIOVASCULAR: Normal heart rate noted, regular rhythm. RESPIRATORY: Clear to auscultation bilaterally. Effort and breath sounds normal, no problems with respiration noted. BREASTS: Symmetric in size. No masses, skin changes, nipple drainage, or lymphadenopathy. ABDOMEN: Soft, normal bowel sounds, no distention noted.  No tenderness, rebound or guarding.  PELVIC: Normal appearing external genitalia; normal appearing vaginal mucosa and cervix.  No  abnormal discharge noted.  Pap smear obtained.  Normal uterine size, no other palpable masses, no uterine or adnexal tenderness. Pelvic exam performed by SNP under my direct supervision.   Assessment and Plan:       1. Missed period - pt reports does not have a period more than every 2 months, which is normal for her - POCT urine pregnancy (negative)  2. Well woman exam - Cytology - PAP - Cervicovaginal ancillary only - discussed smoking cessation, pt reports she is currently cutting back on her own, does not desire assistance at this time, QuitLine information given  Will follow up results of pap smear and other testing, if performed, and manage accordingly. Routine preventative health maintenance measures emphasized. Self-breast awareness taught, importance discussed, advised when to RTC, SBA literature given. Please refer to After Visit Summary for other counseling recommendations.      Stacey Drain, [facility] Women's Health Nurse Practitioner, [facility]  for [employer], [place]

## 2020-07-31 NOTE — Progress Notes (Signed)
Pt. Presents for annual exam

## 2020-07-31 NOTE — Patient Instructions (Addendum)
Preventive Care 21-22 Years Old, Female Preventive care refers to visits with your health care provider and lifestyle choices that can promote health and wellness. This includes:  A yearly physical exam. This may also be called an annual well check.  Regular dental visits and eye exams.  Immunizations.  Screening for certain conditions.  Healthy lifestyle choices, such as eating a healthy diet, getting regular exercise, not using drugs or products that contain nicotine and tobacco, and limiting alcohol use. What can I expect for my preventive care visit? Physical exam Your health care provider will check your:  Height and weight. This may be used to calculate body mass index (BMI), which tells if you are at a healthy weight.  Heart rate and blood pressure.  Skin for abnormal spots. Counseling Your health care provider may ask you questions about your:  Alcohol, tobacco, and drug use.  Emotional well-being.  Home and relationship well-being.  Sexual activity.  Eating habits.  Work and work environment.  Method of birth control.  Menstrual cycle.  Pregnancy history. What immunizations do I need?  Influenza (flu) vaccine  This is recommended every year. Tetanus, diphtheria, and pertussis (Tdap) vaccine  You may need a Td booster every 10 years. Varicella (chickenpox) vaccine  You may need this if you have not been vaccinated. Human papillomavirus (HPV) vaccine  If recommended by your health care provider, you may need three doses over 6 months. Measles, mumps, and rubella (MMR) vaccine  You may need at least one dose of MMR. You may also need a second dose. Meningococcal conjugate (MenACWY) vaccine  One dose is recommended if you are age 19-21 years and a first-year college student living in a residence hall, or if you have one of several medical conditions. You may also need additional booster doses. Pneumococcal conjugate (PCV13) vaccine  You may need  this if you have certain conditions and were not previously vaccinated. Pneumococcal polysaccharide (PPSV23) vaccine  You may need one or two doses if you smoke cigarettes or if you have certain conditions. Hepatitis A vaccine  You may need this if you have certain conditions or if you travel or work in places where you may be exposed to hepatitis A. Hepatitis B vaccine  You may need this if you have certain conditions or if you travel or work in places where you may be exposed to hepatitis B. Haemophilus influenzae type b (Hib) vaccine  You may need this if you have certain conditions. You may receive vaccines as individual doses or as more than one vaccine together in one shot (combination vaccines). Talk with your health care provider about the risks and benefits of combination vaccines. What tests do I need?  Blood tests  Lipid and cholesterol levels. These may be checked every 5 years starting at age 20.  Hepatitis C test.  Hepatitis B test. Screening  Diabetes screening. This is done by checking your blood sugar (glucose) after you have not eaten for a while (fasting).  Sexually transmitted disease (STD) testing.  BRCA-related cancer screening. This may be done if you have a family history of breast, ovarian, tubal, or peritoneal cancers.  Pelvic exam and Pap test. This may be done every 3 years starting at age 21. Starting at age 30, this may be done every 5 years if you have a Pap test in combination with an HPV test. Talk with your health care provider about your test results, treatment options, and if necessary, the need for more tests.   Follow these instructions at home: Eating and drinking   Eat a diet that includes fresh fruits and vegetables, whole grains, lean protein, and low-fat dairy.  Take vitamin and mineral supplements as recommended by your health care provider.  Do not drink alcohol if: ? Your health care provider tells you not to drink. ? You are  pregnant, may be pregnant, or are planning to become pregnant.  If you drink alcohol: ? Limit how much you have to 0-1 drink a day. ? Be aware of how much alcohol is in your drink. In the [place] one drink equals one 12 oz bottle of beer (355 mL), one 5 oz glass of wine (148 mL), or one 1 oz glass of hard liquor (44 mL). Lifestyle  Take daily care of your teeth and gums.  Stay active. Exercise for at least 30 minutes on 5 or more days each week.  Do not use any products that contain nicotine or tobacco, such as cigarettes, e-cigarettes, and chewing tobacco. If you need help quitting, ask your health care provider.  If you are sexually active, practice safe sex. Use a condom or other form of birth control (contraception) in order to prevent pregnancy and STIs (sexually transmitted infections). If you plan to become pregnant, see your health care provider for a preconception visit. What's next?  Visit your health care provider once a year for a well check visit.  Ask your health care provider how often you should have your eyes and teeth checked.  Stay up to date on all vaccines. This information is not intended to replace advice given to you by your health care provider. Make sure you discuss any questions you have with your health care provider. Document Revised: 07/28/2018 Document Reviewed: 07/28/2018 Elsevier Patient Education  2020 [employer] Inc[employer]        Breast Self-Awareness Breast self-awareness means being familiar with how your breasts look and feel. It involves checking your breasts regularly and reporting any changes to your health care provider. Practicing breast self-awareness is important. Sometimes changes may not be harmful (are benign), but sometimes a change in your breasts can be a sign of a serious medical problem. It is important to learn how to do this procedure correctly so that you can catch problems early, when treatment is more likely to be successful. All  women should practice breast self-awareness, including women who have had breast implants. What you need:  A mirror.  A well-lit room. How to do a breast self-exam A breast self-exam is one way to learn what is normal for your breasts and whether your breasts are changing. To do a breast self-exam: Look for changes  1. Remove all the clothing above your waist. 2. Stand in front of a mirror in a room with good lighting. 3. Put your hands on your hips. 4. Push your hands firmly downward. 5. Compare your breasts in the mirror. Look for differences between them (asymmetry), such as: ? Differences in shape. ? Differences in size. ? Puckers, dips, and bumps in one breast and not the other. 6. Look at each breast for changes in the skin, such as: ? Redness. ? Scaly areas. 7. Look for changes in your nipples, such as: ? Discharge. ? Bleeding. ? Dimpling. ? Redness. ? A change in position. Feel for changes Carefully feel your breasts for lumps and changes. It is best to do this while lying on your back on the floor, and again while sitting or standing in the tub   or shower with soapy water on your skin. Feel each breast in the following way: 1. Place the arm on the side of the breast you are examining above your head. 2. Feel your breast with the other hand. 3. Start in the nipple area and make -inch (2 cm) overlapping circles to feel your breast. Use the pads of your three middle fingers to do this. Apply light pressure, then medium pressure, then firm pressure. The light pressure will allow you to feel the tissue closest to the skin. The medium pressure will allow you to feel the tissue that is a little deeper. The firm pressure will allow you to feel the tissue close to the ribs. 4. Continue the overlapping circles, moving downward over the breast until you feel your ribs below your breast. 5. Move one finger-width toward the center of the body. Continue to use the -inch (2 cm)  overlapping circles to feel your breast as you move slowly up toward your collarbone. 6. Continue the up-and-down exam using all three pressures until you reach your armpit.  Write down what you find Writing down what you find can help you remember what to discuss with your health care provider. Write down:  What is normal for each breast.  Any changes that you find in each breast, including: ? The kind of changes you find. ? Any pain or tenderness. ? Size and location of any lumps.  Where you are in your menstrual cycle, if you are still menstruating. General tips and recommendations  Examine your breasts every month.  If you are breastfeeding, the best time to examine your breasts is after a feeding or after using a breast pump.  If you menstruate, the best time to examine your breasts is 5-7 days after your period. Breasts are generally lumpier during menstrual periods, and it may be more difficult to notice changes.  With time and practice, you will become more familiar with the variations in your breasts and more comfortable with the exam. Contact a health care provider if you:  See a change in the shape or size of your breasts or nipples.  See a change in the skin of your breast or nipples, such as a reddened or scaly area.  Have unusual discharge from your nipples.  Find a lump or thick area that was not there before.  Have pain in your breasts.  Have any concerns related to your breast health. Summary  Breast self-awareness includes looking for physical changes in your breasts, as well as feeling for any changes within your breasts.  Breast self-awareness should be performed in front of a mirror in a well-lit room.  You should examine your breasts every month. If you menstruate, the best time to examine your breasts is 5-7 days after your menstrual period.  Let your health care provider know of any changes you notice in your breasts, including changes in size,  changes on the skin, pain or tenderness, or unusual fluid from your nipples. This information is not intended to replace advice given to you by your health care provider. Make sure you discuss any questions you have with your health care provider. Document Revised: 07/05/2018 Document Reviewed: 07/05/2018 Elsevier Patient Education  [place] 64-55 Years Old, Female Preventive care refers to visits with your health care provider and lifestyle choices that can promote health and wellness. This includes:  A yearly physical exam. This may also be called an annual well check.  Regular dental  visits and eye exams.  Immunizations.  Screening for certain conditions.  Healthy lifestyle choices, such as eating a healthy diet, getting regular exercise, not using drugs or products that contain nicotine and tobacco, and limiting alcohol use. What can I expect for my preventive care visit? Physical exam Your health care provider will check your:  Height and weight. This may be used to calculate body mass index (BMI), which tells if you are at a healthy weight.  Heart rate and blood pressure.  Skin for abnormal spots. Counseling Your health care provider may ask you questions about your:  Alcohol, tobacco, and drug use.  Emotional well-being.  Home and relationship well-being.  Sexual activity.  Eating habits.  Work and work Statistician.  Method of birth control.  Menstrual cycle.  Pregnancy history. What immunizations do I need?  Influenza (flu) vaccine  This is recommended every year. Tetanus, diphtheria, and pertussis (Tdap) vaccine  You may need a Td booster every 10 years. Varicella (chickenpox) vaccine  You may need this if you have not been vaccinated. Human papillomavirus (HPV) vaccine  If recommended by your health care provider, you may need three doses over 6 months. Measles, mumps, and rubella (MMR) vaccine  You may need at least  one dose of MMR. You may also need a second dose. Meningococcal conjugate (MenACWY) vaccine  One dose is recommended if you are age 36-21 years and a first-year college student living in a residence hall, or if you have one of several medical conditions. You may also need additional booster doses. Pneumococcal conjugate (PCV13) vaccine  You may need this if you have certain conditions and were not previously vaccinated. Pneumococcal polysaccharide (PPSV23) vaccine  You may need one or two doses if you smoke cigarettes or if you have certain conditions. Hepatitis A vaccine  You may need this if you have certain conditions or if you travel or work in places where you may be exposed to hepatitis A. Hepatitis B vaccine  You may need this if you have certain conditions or if you travel or work in places where you may be exposed to hepatitis B. Haemophilus influenzae type b (Hib) vaccine  You may need this if you have certain conditions. You may receive vaccines as individual doses or as more than one vaccine together in one shot (combination vaccines). Talk with your health care provider about the risks and benefits of combination vaccines. What tests do I need?  Blood tests  Lipid and cholesterol levels. These may be checked every 5 years starting at age 59.  Hepatitis C test.  Hepatitis B test. Screening  Diabetes screening. This is done by checking your blood sugar (glucose) after you have not eaten for a while (fasting).  Sexually transmitted disease (STD) testing.  BRCA-related cancer screening. This may be done if you have a family history of breast, ovarian, tubal, or peritoneal cancers.  Pelvic exam and Pap test. This may be done every 3 years starting at age 42. Starting at age 48, this may be done every 5 years if you have a Pap test in combination with an HPV test. Talk with your health care provider about your test results, treatment options, and if necessary, the need  for more tests. Follow these instructions at home: Eating and drinking   Eat a diet that includes fresh fruits and vegetables, whole grains, lean protein, and low-fat dairy.  Take vitamin and mineral supplements as recommended by your health care provider.  Do not drink  alcohol if: ? Your health care provider tells you not to drink. ? You are pregnant, may be pregnant, or are planning to become pregnant.  If you drink alcohol: ? Limit how much you have to 0-1 drink a day. ? Be aware of how much alcohol is in your drink. In the [place] one drink equals one 12 oz bottle of beer (355 mL), one 5 oz glass of wine (148 mL), or one 1 oz glass of hard liquor (44 mL). Lifestyle  Take daily care of your teeth and gums.  Stay active. Exercise for at least 30 minutes on 5 or more days each week.  Do not use any products that contain nicotine or tobacco, such as cigarettes, e-cigarettes, and chewing tobacco. If you need help quitting, ask your health care provider.  If you are sexually active, practice safe sex. Use a condom or other form of birth control (contraception) in order to prevent pregnancy and STIs (sexually transmitted infections). If you plan to become pregnant, see your health care provider for a preconception visit. What's next?  Visit your health care provider once a year for a well check visit.  Ask your health care provider how often you should have your eyes and teeth checked.  Stay up to date on all vaccines. This information is not intended to replace advice given to you by your health care provider. Make sure you discuss any questions you have with your health care provider. Document Revised: 07/28/2018 Document Reviewed: 07/28/2018 Elsevier Patient Education  2020 [place] Breast self-awareness means being familiar with how your breasts look and feel. It involves checking your breasts regularly and reporting any changes to your  health care provider. Practicing breast self-awareness is important. Sometimes changes may not be harmful (are benign), but sometimes a change in your breasts can be a sign of a serious medical problem. It is important to learn how to do this procedure correctly so that you can catch problems early, when treatment is more likely to be successful. All women should practice breast self-awareness, including women who have had breast implants. What you need:  A mirror.  A well-lit room. How to do a breast self-exam A breast self-exam is one way to learn what is normal for your breasts and whether your breasts are changing. To do a breast self-exam: Look for changes  11. Remove all the clothing above your waist. 12. Stand in front of a mirror in a room with good lighting. 13. Put your hands on your hips. 14. Push your hands firmly downward. 15. Compare your breasts in the mirror. Look for differences between them (asymmetry), such as: ? Differences in shape. ? Differences in size. ? Puckers, dips, and bumps in one breast and not the other. 16. Look at each breast for changes in the skin, such as: ? Redness. ? Scaly areas. 17. Look for changes in your nipples, such as: ? Discharge. ? Bleeding. ? Dimpling. ? Redness. ? A change in position. Feel for changes Carefully feel your breasts for lumps and changes. It is best to do this while lying on your back on the floor, and again while sitting or standing in the tub or shower with soapy water on your skin. Feel each breast in the following way: 16. Place the arm on the side of the breast you are examining above your head. 17. Feel your breast with the other hand. 18. Start in the nipple  area and make -inch (2 cm) overlapping circles to feel your breast. Use the pads of your three middle fingers to do this. Apply light pressure, then medium pressure, then firm pressure. The light pressure will allow you to feel the tissue closest to the skin.  The medium pressure will allow you to feel the tissue that is a little deeper. The firm pressure will allow you to feel the tissue close to the ribs. 19. Continue the overlapping circles, moving downward over the breast until you feel your ribs below your breast. 20. Move one finger-width toward the center of the body. Continue to use the -inch (2 cm) overlapping circles to feel your breast as you move slowly up toward your collarbone. 21. Continue the up-and-down exam using all three pressures until you reach your armpit.  Write down what you find Writing down what you find can help you remember what to discuss with your health care provider. Write down:  What is normal for each breast.  Any changes that you find in each breast, including: ? The kind of changes you find. ? Any pain or tenderness. ? Size and location of any lumps.  Where you are in your menstrual cycle, if you are still menstruating. General tips and recommendations  Examine your breasts every month.  If you are breastfeeding, the best time to examine your breasts is after a feeding or after using a breast pump.  If you menstruate, the best time to examine your breasts is 5-7 days after your period. Breasts are generally lumpier during menstrual periods, and it may be more difficult to notice changes.  With time and practice, you will become more familiar with the variations in your breasts and more comfortable with the exam. Contact a health care provider if you:  See a change in the shape or size of your breasts or nipples.  See a change in the skin of your breast or nipples, such as a reddened or scaly area.  Have unusual discharge from your nipples.  Find a lump or thick area that was not there before.  Have pain in your breasts.  Have any concerns related to your breast health. Summary  Breast self-awareness includes looking for physical changes in your breasts, as well as feeling for any changes within  your breasts.  Breast self-awareness should be performed in front of a mirror in a well-lit room.  You should examine your breasts every month. If you menstruate, the best time to examine your breasts is 5-7 days after your menstrual period.  Let your health care provider know of any changes you notice in your breasts, including changes in size, changes on the skin, pain or tenderness, or unusual fluid from your nipples. This information is not intended to replace advice given to you by your health care provider. Make sure you discuss any questions you have with your health care provider. Document Revised: 07/05/2018 Document Reviewed: 07/05/2018 Elsevier Patient Education  2020 [employer].        Steps to Quit Smoking Smoking tobacco is the leading cause of preventable death. It can affect almost every organ in the body. Smoking puts you and those around you at risk for developing many serious chronic diseases. Quitting smoking can be difficult, but it is one of the best things that you can do for your health. It is never too late to quit. How do I get ready to quit? When you decide to quit smoking, create a plan to help  you succeed. Before you quit:  Pick a date to quit. Set a date within the next 2 weeks to give you time to prepare.  Write down the reasons why you are quitting. Keep this list in places where you will see it often.  Tell your family, friends, and co-workers that you are quitting. Support from your loved ones can make quitting easier.  Talk with your health care provider about your options for quitting smoking.  Find out what treatment options are covered by your health insurance.  Identify people, places, things, and activities that make you want to smoke (triggers). Avoid them. What first steps can I take to quit smoking?  Throw away all cigarettes at home, at work, and in your car.  Throw away smoking accessories, such as Scientist, research (medical).  Clean  your car. Make sure to empty the ashtray.  Clean your home, including curtains and carpets. What strategies can I use to quit smoking? Talk with your health care provider about combining strategies, such as taking medicines while you are also receiving in-person counseling. Using these two strategies together makes you more likely to succeed in quitting than if you used either strategy on its own.  If you are pregnant or breastfeeding, talk with your health care provider about finding counseling or other support strategies to quit smoking. Do not take medicine to help you quit smoking unless your health care provider tells you to do so. To quit smoking: Quit right away  Quit smoking completely, instead of gradually reducing how much you smoke over a period of time. Research shows that stopping smoking right away is more successful than gradually quitting.  Attend in-person counseling to help you build problem-solving skills. You are more likely to succeed in quitting if you attend counseling sessions regularly. Even short sessions of 10 minutes can be effective. Take medicine You may take medicines to help you quit smoking. Some medicines require a prescription and some you can purchase over-the-counter. Medicines may have nicotine in them to replace the nicotine in cigarettes. Medicines may:  Help to stop cravings.  Help to relieve withdrawal symptoms. Your health care provider may recommend:  Nicotine patches, gum, or lozenges.  Nicotine inhalers or sprays.  Non-nicotine medicine that is taken by mouth. Find resources Find resources and support systems that can help you to quit smoking and remain smoke-free after you quit. These resources are most helpful when you use them often. They include:  Online chats with a Social worker.  Telephone quitlines.  Printed Furniture conservator/restorer.  Support groups or group counseling.  Text messaging programs.  Mobile phone apps or applications.  Use apps that can help you stick to your quit plan by providing reminders, tips, and encouragement. There are many free apps for mobile devices as well as websites. Examples include Quit Guide from the [employer] and smokefree.gov What things can I do to make it easier to quit?   Reach out to your family and friends for support and encouragement. Call telephone quitlines (1-800-QUIT-NOW), reach out to support groups, or work with a counselor for support.  Ask people who smoke to avoid smoking around you.  Avoid places that trigger you to smoke, such as bars, parties, or smoke-break areas at work.  Spend time with people who do not smoke.  Lessen the stress in your life. Stress can be a smoking trigger for some people. To lessen stress, try: ? Exercising regularly. ? Doing deep-breathing exercises. ? Doing yoga. ? Meditating. ? Performing  a body scan. This involves closing your eyes, scanning your body from head to toe, and noticing which parts of your body are particularly tense. Try to relax the muscles in those areas. How will I feel when I quit smoking? Day 1 to 3 weeks Within the first 24 hours of quitting smoking, you may start to feel withdrawal symptoms. These symptoms are usually most noticeable 2-3 days after quitting, but they usually do not last for more than 2-3 weeks. You may experience these symptoms:  Mood swings.  Restlessness, anxiety, or irritability.  Trouble concentrating.  Dizziness.  Strong cravings for sugary foods and nicotine.  Mild weight gain.  Constipation.  Nausea.  Coughing or a sore throat.  Changes in how the medicines that you take for unrelated issues work in your body.  Depression.  Trouble sleeping (insomnia). Week 3 and afterward After the first 2-3 weeks of quitting, you may start to notice more positive results, such as:  Improved sense of smell and taste.  Decreased coughing and sore throat.  Slower heart rate.  Lower blood  pressure.  Clearer skin.  The ability to breathe more easily.  Fewer sick days. Quitting smoking can be very challenging. Do not get discouraged if you are not successful the first time. Some people need to make many attempts to quit before they achieve long-term success. Do your best to stick to your quit plan, and talk with your health care provider if you have any questions or concerns. Summary  Smoking tobacco is the leading cause of preventable death. Quitting smoking is one of the best things that you can do for your health.  When you decide to quit smoking, create a plan to help you succeed.  Quit smoking right away, not slowly over a period of time.  When you start quitting, seek help from your health care provider, family, or friends. This information is not intended to replace advice given to you by your health care provider. Make sure you discuss any questions you have with your health care provider. Document Revised: 08/11/2019 Document Reviewed: 02/04/2019 Elsevier Patient Education  [employer].       Ethinyl Estradiol; Etonogestrel vaginal ring What is this medicine? ETHINYL ESTRADIOL; ETONOGESTREL (ETH in il es tra DYE ole; et oh noe JES trel) vaginal ring is a flexible, vaginal ring used as a contraceptive (birth control method). This medicine combines 2 types of female hormones, an estrogen and a progestin. This ring is used to prevent ovulation and pregnancy. Each ring is effective for 1 month. This medicine may be used for other purposes; ask your health care provider or pharmacist if you have questions. COMMON BRAND NAME(S): EluRyng, NuvaRing What should I tell my health care provider before I take this medicine? They need to know if you have any of these conditions:  abnormal vaginal bleeding  blood vessel disease or blood clots  breast, cervical, endometrial, ovarian, liver, or uterine cancer  diabetes  gallbladder disease  having  surgery  heart disease or recent heart attack  high blood pressure  high cholesterol or triglycerides  history of irregular heartbeat or heart valve problems  kidney disease  liver disease  migraine headaches  protein C deficiency  protein S deficiency  recently had a baby, miscarriage, or abortion  stroke  systemic lupus erythematosus (SLE)  tobacco smoker  your age is more than 22 years old  an unusual or allergic reaction to estrogens, progestins, other medicines, foods, dyes, or preservatives  pregnant or trying to get pregnant  breast-feeding How should I use this medicine? Insert the ring into your vagina as directed. Follow the directions on the prescription label. The ring will remain place for 3 weeks and is then removed for a 1-week break. A new ring is inserted 1 week after the last ring was removed, on the same day of the week. Check often to make sure the ring is still in place. If the ring was out of the vagina for an unknown amount of time, you may not be protected from pregnancy. Perform a pregnancy test and call your doctor. Do not use more often than directed. A patient package insert for the product will be given with each prescription and refill. Read this sheet carefully each time. The sheet may change frequently. Contact your pediatrician regarding the use of this medicine in children. Special care may be needed. Overdosage: If you think you have taken too much of this medicine contact a poison control center or emergency room at once. NOTE: This medicine is only for you. Do not share this medicine with others. What if I miss a dose? You will need to use the ring exactly as directed. It is very important to follow the schedule every cycle. If you do not use the ring as directed, you may not be protected from pregnancy. If the ring should slip out, is lost, or if you leave it in longer or shorter than you should, contact your health care professional for  advice. What may interact with this medicine? Do not take this medicine with the following medications:  dasabuvir; ombitasvir; paritaprevir; ritonavir  ombitasvir; paritaprevir; ritonavir  vaginal lubricants or other vaginal products that are oil-based or silicone-based This medicine may also interact with the following medications:  acetaminophen  antibiotics or medicines for infections, especially rifampin, rifabutin, rifapentine, and griseofulvin, and possibly penicillins or tetracyclines  aprepitant or fosaprepitant  armodafinil  ascorbic acid (vitamin C)  barbiturate medicines, such as phenobarbital or primidone  bosentan  certain antiviral medicines for hepatitis, HIV or AIDS  certain medicines for cancer treatment  certain medicines for seizures like carbamazepine, clobazam, felbamate, lamotrigine, oxcarbazepine, phenytoin, rufinamide, topiramate  certain medicines for treating high cholesterol  cyclosporine  dantrolene  elagolix  flibanserin  grapefruit juice  lesinurad  medicines for diabetes  medicines to treat fungal infections, such as griseofulvin, miconazole, fluconazole, ketoconazole, itraconazole, posaconazole or voriconazole  mifepristone  mitotane  modafinil  morphine  mycophenolate  St. John's wort  tamoxifen  temazepam  theophylline or aminophylline  thyroid hormones  tizanidine  tranexamic acid  ulipristal  warfarin This list may not describe all possible interactions. Give your health care provider a list of all the medicines, herbs, non-prescription drugs, or dietary supplements you use. Also tell them if you smoke, drink alcohol, or use illegal drugs. Some items may interact with your medicine. What should I watch for while using this medicine? Visit your doctor or health care professional for regular checks on your progress. You will need a regular breast and pelvic exam and Pap smear while on this  medicine. Check with your doctor or health care professional to see if you need an additional method of contraception during the first cycle that you use this ring. Female condoms (made with natural rubber latex, polyisoprene, and polyurethane) and spermicides may be used. Do not use a diaphragm, cervical cap, or a female condom, as the ring can interfere with these birth control methods and their proper placement.  If you have any reason to think you are pregnant, stop using this medicine right away and contact your doctor or health care professional. If you are using this medicine for hormone related problems, it may take several cycles of use to see improvement in your condition. Smoking increases the risk of getting a blood clot or having a stroke while you are using hormonal birth control, especially if you are more than 22 years old. You are strongly advised not to smoke. Some women are prone to getting dark patches on the skin of the face (cholasma). Your risk of getting chloasma with this medicine is higher if you had chloasma during a pregnancy. Keep out of the sun. If you cannot avoid being in the sun, wear protective clothing and use sunscreen. Do not use sun lamps or tanning beds/booths. This medicine can make your body retain fluid, making your fingers, hands, or ankles swell. Your blood pressure can go up. Contact your doctor or health care professional if you feel you are retaining fluid. If you are going to have elective surgery, you may need to stop using this medicine before the surgery. Consult your health care professional for advice. This medicine does not protect you against HIV infection (AIDS) or any other sexually transmitted diseases. What side effects may I notice from receiving this medicine? Side effects that you should report to your doctor or health care professional as soon as possible:  allergic reactions such as skin rash or itching, hives, swelling of the lips, mouth,  tongue, or throat  depression  high blood pressure  migraines or severe, sudden headaches  signs and symptoms of a blood clot such as breathing problems; changes in vision; chest pain; severe, sudden headache; pain, swelling, warmth in the leg; trouble speaking; sudden numbness or weakness of the face, arm or leg  signs and symptoms of infection like fever or chills with dizziness and a sunburn-like rash, or pain or trouble passing urine  stomach pain  symptoms of vaginal infection like itching, irritation or unusual discharge  yellowing of the eyes or skin Side effects that usually do not require medical attention (report these to your doctor or health care professional if they continue or are bothersome):  acne  breast pain, tenderness  irregular vaginal bleeding or spotting, particularly during the first month of use  mild headache  nausea  painful periods  vomiting This list may not describe all possible side effects. Call your doctor for medical advice about side effects. You may report side effects to FDA at 1-800-FDA-1088. Where should I keep my medicine? Keep out of the reach of children. Store unopened medicine for up to 4 months at room temperature at 15 and 30 degrees C (59 and 86 degrees F). Protect from light. Do not store above 30 degrees C (86 degrees F). Throw away any unused medicine 4 months after the dispense date or the expiration date, whichever comes first. A ring may only be used for 1 cycle (1 month). After the 3-week cycle, a used ring is removed and should be placed in the re-closable foil pouch and discarded in the trash out of reach of children and pets. Do NOT flush down the toilet. NOTE: This sheet is a summary. It may not cover all possible information. If you have questions about this medicine, talk to your doctor, pharmacist, or health care provider.  2020 Elsevier/[employer] Standard (2019-06-08 12:31:47)

## 2020-08-02 LAB — CERVICOVAGINAL ANCILLARY ONLY
Bacterial Vaginitis (gardnerella): POSITIVE — AB
Candida Glabrata: NEGATIVE
Candida Vaginitis: NEGATIVE
Chlamydia: POSITIVE — AB
Comment: NEGATIVE
Comment: NEGATIVE
Comment: NEGATIVE
Comment: NEGATIVE
Comment: NEGATIVE
Comment: NORMAL
Neisseria Gonorrhea: NEGATIVE
Trichomonas: NEGATIVE

## 2020-08-02 LAB — CYTOLOGY - PAP: Diagnosis: NEGATIVE

## 2020-08-06 ENCOUNTER — Other Ambulatory Visit: Payer: Self-pay

## 2020-08-06 ENCOUNTER — Telehealth: Payer: Self-pay

## 2020-08-06 DIAGNOSIS — N760 Acute vaginitis: Secondary | ICD-10-CM

## 2020-08-06 DIAGNOSIS — A749 Chlamydial infection, unspecified: Secondary | ICD-10-CM

## 2020-08-06 MED ORDER — METRONIDAZOLE 500 MG PO TABS: 500.0000 mg | ORAL_TABLET | Freq: Two times a day (BID) | ORAL | 0 refills | Status: DC

## 2020-08-06 MED ORDER — AZITHROMYCIN 500 MG PO TABS: 1000.0000 mg | ORAL_TABLET | Freq: Once | ORAL | Status: DC

## 2020-08-06 NOTE — Telephone Encounter (Signed)
Patient called wanting to discuss lab results. Results reviewed with patient and treatment sent to the pharmacy per protocol. Patient advised to inform partner of positive results and the need for treatment. I advised patient that he can be treated at his pcp or the health department. Patient also advised to abstain from sexual IC for 2 weeks after she and her partner has completed treatment. HD form completed and faxed.

## 2021-03-13 ENCOUNTER — Emergency Department (HOSPITAL_COMMUNITY): Payer: Medicaid Other

## 2021-03-13 ENCOUNTER — Other Ambulatory Visit: Payer: Self-pay

## 2021-03-13 ENCOUNTER — Emergency Department (HOSPITAL_COMMUNITY)
Admission: EM | Admit: 2021-03-13 | Discharge: 2021-03-13 | Disposition: A | Discharge status: Home / Self Care | Payer: Medicaid Other | Attending: Emergency Medicine | Admitting: Emergency Medicine

## 2021-03-13 ENCOUNTER — Encounter (HOSPITAL_COMMUNITY): Payer: Self-pay | Admitting: Emergency Medicine

## 2021-03-13 DIAGNOSIS — S299XXA Unspecified injury of thorax, initial encounter: Secondary | ICD-10-CM | POA: Diagnosis present

## 2021-03-13 DIAGNOSIS — N83209 Unspecified ovarian cyst, unspecified side: Secondary | ICD-10-CM | POA: Diagnosis not present

## 2021-03-13 DIAGNOSIS — Y92410 Unspecified street and highway as the place of occurrence of the external cause: Secondary | ICD-10-CM | POA: Insufficient documentation

## 2021-03-13 DIAGNOSIS — Z87891 Personal history of nicotine dependence: Secondary | ICD-10-CM | POA: Insufficient documentation

## 2021-03-13 DIAGNOSIS — M542 Cervicalgia: Secondary | ICD-10-CM | POA: Insufficient documentation

## 2021-03-13 DIAGNOSIS — S20211A Contusion of right front wall of thorax, initial encounter: Secondary | ICD-10-CM | POA: Insufficient documentation

## 2021-03-13 DIAGNOSIS — N390 Urinary tract infection, site not specified: Secondary | ICD-10-CM

## 2021-03-13 DIAGNOSIS — R079 Chest pain, unspecified: Secondary | ICD-10-CM

## 2021-03-13 DIAGNOSIS — S161XXA Strain of muscle, fascia and tendon at neck level, initial encounter: Secondary | ICD-10-CM

## 2021-03-13 LAB — COMPREHENSIVE METABOLIC PANEL
ALT: 12 U/L (ref 0–44)
AST: 19 U/L (ref 15–41)
Albumin: 3.8 g/dL (ref 3.5–5.0)
Alkaline Phosphatase: 53 U/L (ref 38–126)
Anion gap: 8 (ref 5–15)
BUN: 16 mg/dL (ref 6–20)
CO2: 25 mmol/L (ref 22–32)
Calcium: 9.1 mg/dL (ref 8.9–10.3)
Chloride: 109 mmol/L (ref 98–111)
Creatinine, Ser: 0.78 mg/dL (ref 0.44–1.00)
GFR, Estimated: >60 mL/min (ref >60)
Glucose, Bld: 99 mg/dL (ref 70–99)
Potassium: 3.6 mmol/L (ref 3.5–5.1)
Sodium: 142 mmol/L (ref 135–145)
Total Bilirubin: 0.6 mg/dL (ref 0.3–1.2)
Total Protein: 7.3 g/dL (ref 6.5–8.1)

## 2021-03-13 LAB — PROTIME-INR
INR: 1 (ref 0.8–1.2)
Prothrombin Time: 13.6 seconds (ref 11.4–15.2)

## 2021-03-13 LAB — URINALYSIS, ROUTINE W REFLEX MICROSCOPIC
Bilirubin Urine: NEGATIVE
Glucose, UA: NEGATIVE mg/dL
Ketones, ur: NEGATIVE mg/dL
Nitrite: POSITIVE — AB
Protein, ur: 30 mg/dL — AB
Specific Gravity, Urine: 1.029 (ref 1.005–1.030)
WBC, UA: >50 WBC/hpf — ABNORMAL HIGH (ref 0–5)
pH: 5 (ref 5.0–8.0)

## 2021-03-13 LAB — I-STAT BETA HCG BLOOD, ED (MC, WL, AP ONLY): I-stat hCG, quantitative: 51.3 m[IU]/mL — ABNORMAL HIGH (ref <5)

## 2021-03-13 LAB — I-STAT CHEM 8, ED
BUN: 17 mg/dL (ref 6–20)
Calcium, Ion: 1.2 mmol/L (ref 1.15–1.40)
Chloride: 108 mmol/L (ref 98–111)
Creatinine, Ser: 0.7 mg/dL (ref 0.44–1.00)
Glucose, Bld: 101 mg/dL — ABNORMAL HIGH (ref 70–99)
HCT: 41 % (ref 36.0–46.0)
Hemoglobin: 13.9 g/dL (ref 12.0–15.0)
Potassium: 3.6 mmol/L (ref 3.5–5.1)
Sodium: 143 mmol/L (ref 135–145)
TCO2: 24 mmol/L (ref 22–32)

## 2021-03-13 LAB — CBC
HCT: 41.8 % (ref 36.0–46.0)
Hemoglobin: 13.6 g/dL (ref 12.0–15.0)
MCH: 31.4 pg (ref 26.0–34.0)
MCHC: 32.5 g/dL (ref 30.0–36.0)
MCV: 96.5 fL (ref 80.0–100.0)
Platelets: 296 10*3/uL (ref 150–400)
RBC: 4.33 MIL/uL (ref 3.87–5.11)
RDW: 12.2 % (ref 11.5–15.5)
WBC: 7.6 10*3/uL (ref 4.0–10.5)
nRBC: 0 % (ref 0.0–0.2)

## 2021-03-13 LAB — HCG, QUANTITATIVE, PREGNANCY: hCG, Beta Chain, Quant, S: 63 m[IU]/mL — ABNORMAL HIGH (ref <5)

## 2021-03-13 LAB — LACTIC ACID, PLASMA: Lactic Acid, Venous: 0.9 mmol/L (ref 0.5–1.9)

## 2021-03-13 MED ORDER — METHOCARBAMOL 750 MG PO TABS: 750.0000 mg | ORAL_TABLET | Freq: Three times a day (TID) | ORAL | 0 refills | Status: DC | PRN

## 2021-03-13 MED ORDER — PHENAZOPYRIDINE HCL 100 MG PO TABS
200.0000 mg | ORAL_TABLET | Freq: Once | ORAL | Status: AC
  Administered 2021-03-13: 200 mg via ORAL
  Filled 2021-03-13: qty 2

## 2021-03-13 MED ORDER — CEPHALEXIN 500 MG PO CAPS: 500.0000 mg | ORAL_CAPSULE | Freq: Four times a day (QID) | ORAL | 0 refills | Status: DC

## 2021-03-13 MED ORDER — CEPHALEXIN 250 MG PO CAPS
1000.0000 mg | ORAL_CAPSULE | Freq: Once | ORAL | Status: AC
  Administered 2021-03-13: 1000 mg via ORAL
  Filled 2021-03-13: qty 4

## 2021-03-13 MED ORDER — ACETAMINOPHEN 500 MG PO TABS
1000.0000 mg | ORAL_TABLET | Freq: Once | ORAL | Status: AC
  Administered 2021-03-13: 1000 mg via ORAL
  Filled 2021-03-13: qty 2

## 2021-03-13 NOTE — Discharge Instructions (Addendum)
It was our pleasure to provide your ER care today - we hope that you feel better.  Drink plenty of fluids/stay well hydrated.   The lab tests show a urine infection - take antibiotic as prescribed.   Take acetaminophen or ibuprofen as need.  You may also take robaxin as need for muscle pain/spasm - no driving when taking.   Follow up with primary care doctor in one week if symptoms fail to improve/resolve.  Return to ER If worse, new symptoms, fevers, new, worsening or severe pain, vomiting, weak/faint, or other concern.
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
## 2021-03-13 NOTE — ED Notes (Signed)
Patient transported to CT 

## 2021-03-14 ENCOUNTER — Ambulatory Visit: Payer: Self-pay | Admitting: *Deleted

## 2021-03-14 NOTE — Telephone Encounter (Signed)
Patient is calling to report she is possibly having an allergic reaction to a medication she was given at ED yesterday- patient is having full body itching and redness. Patient has taken Benadryl. Patient advised continue Benadryl, push fliuds and per disposition be seen within 4 hours. Patient advised to contact PCP- if not open- then UC for evaluation. Patient has not taken any medication today- but needs to treat UTI.  Reason for Disposition . [1] Taking new prescription medication AND [2] rash within 4 hours of 1st dose  Answer Assessment - Initial Assessment Questions 1. DESCRIPTION: "Describe the itching you are having."     All over entire body 2. SEVERITY: "How bad is it?"    - MILD - doesn't interfere with normal activities   - MODERATE-SEVERE: interferes with work, school, sleep, or other activities      Moderate/severe 3. SCRATCHING: "Are there any scratch marks? Bleeding?"     Yes- no marks 4. ONSET: "When did this begin?"      Started last night after leaving hospital 5. CAUSE: "What do you think is causing the itching?" (ask about swimming pools, pollen, animals, soaps, etc.)     Medication reaction 6. OTHER SYMPTOMS: "Do you have any other symptoms?"      Redness over body 7. PREGNANCY: "Is there any chance you are pregnant?" "When was your last menstrual period?"     No- LMP-irregular- procedure in March  Protocols used: RASH - WIDESPREAD ON [facility]icit[facility]

## 2021-05-22 ENCOUNTER — Ambulatory Visit (INDEPENDENT_AMBULATORY_CARE_PROVIDER_SITE_OTHER): Payer: Medicaid Other

## 2021-05-22 ENCOUNTER — Other Ambulatory Visit: Payer: Self-pay

## 2021-05-22 VITALS — BP 106/78 | HR 72

## 2021-05-22 DIAGNOSIS — Z3201 Encounter for pregnancy test, result positive: Secondary | ICD-10-CM | POA: Diagnosis not present

## 2021-05-22 DIAGNOSIS — Z3200 Encounter for pregnancy test, result unknown: Secondary | ICD-10-CM

## 2021-05-22 LAB — POCT URINE PREGNANCY: Preg Test, Ur: POSITIVE — AB

## 2021-05-22 MED ORDER — PROMETHAZINE HCL 25 MG PO TABS: 25.0000 mg | ORAL_TABLET | Freq: Four times a day (QID) | ORAL | 0 refills | Status: DC | PRN

## 2021-05-22 MED ORDER — PREPLUS 27-1 MG PO TABS: 1.0000 | ORAL_TABLET | Freq: Every day | ORAL | 13 refills | Status: DC

## 2021-05-22 NOTE — Progress Notes (Signed)
Catherine Collins presents today for UPT. She has no unusual complaints. LMP:11/30/2020    OBJECTIVE: Appears well, in no apparent distress.  OB History     Gravida  2   Para  2   Term  2   Preterm      AB      Living  2      SAB      IAB      Ectopic      Multiple  0   Live Births  2          Home UPT Result: positive In-Office UPT result:positive  I have reviewed the patient's medical, obstetrical, social, and family histories, and medications.   ASSESSMENT: Positive pregnancy test  PLAN Prenatal care to be completed at Around 10 weeks at [facility]

## 2021-05-27 ENCOUNTER — Encounter: Payer: Medicaid Other | Admitting: Family Medicine

## 2021-06-05 ENCOUNTER — Encounter: Payer: Medicaid Other | Admitting: Obstetrics & Gynecology

## 2021-06-11 ENCOUNTER — Ambulatory Visit (INDEPENDENT_AMBULATORY_CARE_PROVIDER_SITE_OTHER): Payer: Medicaid Other | Admitting: Obstetrics

## 2021-06-11 ENCOUNTER — Other Ambulatory Visit (HOSPITAL_COMMUNITY)
Admission: RE | Admit: 2021-06-11 | Discharge: 2021-06-11 | Disposition: A | Discharge status: Home / Self Care | Payer: Medicaid Other | Source: Ambulatory Visit | Attending: Obstetrics | Admitting: Obstetrics

## 2021-06-11 ENCOUNTER — Encounter: Payer: Self-pay | Admitting: Obstetrics

## 2021-06-11 ENCOUNTER — Ambulatory Visit (INDEPENDENT_AMBULATORY_CARE_PROVIDER_SITE_OTHER): Payer: Medicaid Other

## 2021-06-11 ENCOUNTER — Other Ambulatory Visit: Payer: Self-pay

## 2021-06-11 VITALS — BP 115/83 | HR 84 | Wt 124.0 lb

## 2021-06-11 DIAGNOSIS — Z3687 Encounter for antenatal screening for uncertain dates: Secondary | ICD-10-CM

## 2021-06-11 DIAGNOSIS — Z3480 Encounter for supervision of other normal pregnancy, unspecified trimester: Secondary | ICD-10-CM

## 2021-06-11 DIAGNOSIS — Z3A09 9 weeks gestation of pregnancy: Secondary | ICD-10-CM | POA: Diagnosis not present

## 2021-06-11 DIAGNOSIS — O219 Vomiting of pregnancy, unspecified: Secondary | ICD-10-CM

## 2021-06-11 MED ORDER — METRONIDAZOLE 500 MG PO TABS: 500.0000 mg | ORAL_TABLET | Freq: Two times a day (BID) | ORAL | 2 refills | Status: DC

## 2021-06-11 MED ORDER — DOXYLAMINE-PYRIDOXINE 10-10 MG PO TBEC: DELAYED_RELEASE_TABLET | ORAL | 5 refills | Status: DC

## 2021-06-11 MED ORDER — VITAFOL ULTRA 29-0.6-0.4-200 MG PO CAPS: 1.0000 | ORAL_CAPSULE | Freq: Every day | ORAL | 4 refills | Status: DC

## 2021-06-11 NOTE — Progress Notes (Signed)
Subjective:    Catherine Collins is being seen today for her first obstetrical visit.  This is not a planned pregnancy. She is at [redacted]w[redacted]d gestation. Her obstetrical history is significant for intrauterine growth restriction (IUGR) and smoker. Relationship with FOB: significant other, living together. Patient does intend to breast feed. Pregnancy history fully reviewed.  The information documented in the HPI was reviewed and verified.  Menstrual History: OB History     Gravida  3   Para  2   Term  2   Preterm      AB      Living  2      SAB      IAB      Ectopic      Multiple  0   Live Births  2            Patient's last menstrual period was 03/13/2021.    Past Medical History:  Diagnosis Date   Anemia    Anxiety    Depression    Mental disorder    Supervision of other normal pregnancy, antepartum 07/13/2019    Nursing Staff Provider Office Location  [facility] Dating  Uncertain LMP/10 Language   ENGLISH Anatomy US  Normal Flu Vaccine   Genetic Screen  NIPS: Low risk Female  AFP:    TDaP vaccine    Hgb A1C or  GTT Early  Third trimester  Rhogam  NA   LAB RESULTS  Feeding Plan  both Blood Type B/Positive/-- (08/13 1437)  Contraception  Condoms Antibody Negative (08/13 1437) Circumcision  yes if boy [place]    Past Surgical History:  Procedure Laterality Date   WISDOM TOOTH EXTRACTION      (Not in a hospital admission)  Allergies  Allergen Reactions   Betamethasone Rash    Social History   Tobacco Use   Smoking status: Former    Packs/day: 0.50    Pack years: 0.00    Types: Cigarettes    Quit date: 02/11/2018    Years since quitting: 3.3   Smokeless tobacco: Never   Tobacco comments:    stopped smoking after preg confirm  Substance Use Topics   Alcohol use: Not Currently    Alcohol/week: 0.0 standard drinks    Family History  Problem Relation Age of Onset   Drug abuse Maternal Grandfather    Diabetes Maternal Grandfather    Hypertension Maternal  Grandfather    Vision loss Maternal Grandfather    Cancer Maternal Grandmother    COPD Maternal Grandmother      Review of Systems Constitutional: negative for weight loss Gastrointestinal: positive for nausea and vomiting Genitourinary:negative for genital lesions and vaginal discharge and dysuria Musculoskeletal:negative for back pain Behavioral/Psych: negative for abusive relationship, depression, illegal drug usage and tobacco use    Objective:    BP 115/83   Pulse 84   Wt 124 lb (56.2 kg)   LMP 03/13/2021   BMI 22.68 kg/m  General Appearance:    Alert, cooperative, no distress, appears stated age  Head:    Normocephalic, without obvious abnormality, atraumatic  Eyes:    PERRL, conjunctiva/corneas clear, EOM's intact, fundi    benign, both eyes  Ears:    Normal TM's and external ear canals, both ears  Nose:   Nares normal, septum midline, mucosa normal, no drainage    or sinus tenderness  Throat:   Lips, mucosa, and tongue normal; teeth and gums normal  Neck:   Supple, symmetrical, trachea midline,  no adenopathy;    thyroid:  no enlargement/tenderness/nodules; no carotid   bruit or JVD  Back:     Symmetric, no curvature, ROM normal, no CVA tenderness  Lungs:     Clear to auscultation bilaterally, respirations unlabored  Chest Wall:    No tenderness or deformity   Heart:    Regular rate and rhythm, S1 and S2 normal, no murmur, rub   or gallop  Breast Exam:    No tenderness, masses, or nipple abnormality  Abdomen:     Soft, non-tender, bowel sounds active all four quadrants,    no masses, no organomegaly  Genitalia:    Normal female without lesion, discharge or tenderness  Extremities:   Extremities normal, atraumatic, no cyanosis or edema  Pulses:   2+ and symmetric all extremities  Skin:   Skin color, texture, turgor normal, no rashes or lesions  Lymph nodes:   Cervical, supraclavicular, and axillary nodes normal  Neurologic:   CNII-XII intact, normal strength,  sensation and reflexes    throughout      Lab Review Urine pregnancy test Labs reviewed yes Radiologic studies reviewed yes  Assessment:    Pregnancy at [redacted]w[redacted]d weeks    Plan:    1. Supervision of other normal pregnancy, antepartum Rx: - Cervicovaginal ancillary only( [facility] [place]) - Obstetric Panel, Including HIV - Hepatitis C antibody - Culture, OB Urine - Genetic Screening - Prenat-Fe Poly-Methfol-FA-DHA (VITAFOL ULTRA) 29-0.6-0.4-200 MG CAPS; Take 1 capsule by mouth daily before breakfast.  Dispense: 90 capsule; Refill: 4  2. Unsure of LMP (last menstrual period) as reason for ultrasound scan Rx: - US OB Limited; Future  3. Nausea and vomiting in pregnancy Rx: - Doxylamine-Pyridoxine (DICLEGIS) 10-10 MG TBEC; 1 tab in AM, 1 tab mid afternoon 2 tabs at bedtime. Max dose 4 tabs daily.  Dispense: 100 tablet; Refill: 5    Prenatal vitamins.  Counseling provided regarding continued use of seat belts, cessation of alcohol consumption, smoking or use of illicit drugs; infection precautions i.e., influenza/TDAP immunizations, toxoplasmosis,CMV, parvovirus, listeria and varicella; workplace safety, exercise during pregnancy; routine dental care, safe medications, sexual activity, hot tubs, saunas, pools, travel, caffeine use, fish and methlymercury, potential toxins, hair treatments, varicose veins Weight gain recommendations per IOM guidelines reviewed: underweight/BMI< 18.5--> gain 28 - 40 lbs; normal weight/BMI 18.5 - 24.9--> gain 25 - 35 lbs; overweight/BMI 25 - 29.9--> gain 15 - 25 lbs; obese/BMI >30->gain  11 - 20 lbs Problem list reviewed and updated. FIRST/CF mutation testing/NIPT/QUAD SCREEN/fragile X/Ashkenazi Jewish population testing/Spinal muscular atrophy discussed: requested. Role of ultrasound in pregnancy discussed; fetal survey: requested. Amniocentesis discussed: not indicated.  Meds ordered this encounter  Medications   DISCONTD: metroNIDAZOLE (FLAGYL) 500 MG  tablet    Sig: Take 1 tablet (500 mg total) by mouth 2 (two) times daily.    Dispense:  14 tablet    Refill:  2   Prenat-Fe Poly-Methfol-FA-DHA (VITAFOL ULTRA) 29-0.6-0.4-200 MG CAPS    Sig: Take 1 capsule by mouth daily before breakfast.    Dispense:  90 capsule    Refill:  4   Doxylamine-Pyridoxine (DICLEGIS) 10-10 MG TBEC    Sig: 1 tab in AM, 1 tab mid afternoon 2 tabs at bedtime. Max dose 4 tabs daily.    Dispense:  100 tablet    Refill:  5   Orders Placed This Encounter  Procedures   Culture, OB Urine   US OB Limited    Standing Status:   Future  Number of Occurrences:   1    Standing Expiration Date:   06/11/2022    Order Specific Question:   Reason for Exam (SYMPTOM  OR DIAGNOSIS REQUIRED)    Answer:   Dating    Order Specific Question:   Preferred Imaging Location?    Answer:   External   Obstetric Panel, Including HIV   Hepatitis C antibody   Genetic Screening    Follow up in 4 weeks.  I have spent a total of 15 minutes of face-to-face time, excluding clinical staff time, reviewing notes and preparing to see patient, ordering tests and/or medications, and counseling the patient.    Brock Bad, MD 06/11/2021 11:56 AM

## 2021-06-11 NOTE — Progress Notes (Signed)
Pt states she is unsure of dates.  Had a cycle in January and then again in April, pt has had irregular cycles.  Pt would like to try different Rx for nausea.

## 2021-06-12 ENCOUNTER — Other Ambulatory Visit: Payer: Self-pay

## 2021-06-12 LAB — OBSTETRIC PANEL, INCLUDING HIV
Antibody Screen: NEGATIVE
Basophils Absolute: 0 10*3/uL (ref 0.0–0.2)
Basos: 0 %
EOS (ABSOLUTE): 0.2 10*3/uL (ref 0.0–0.4)
Eos: 3 %
HIV Screen 4th Generation wRfx: NONREACTIVE
Hematocrit: 37.8 % (ref 34.0–46.6)
Hemoglobin: 12.5 g/dL (ref 11.1–15.9)
Hepatitis B Surface Ag: NEGATIVE
Immature Grans (Abs): 0 10*3/uL (ref 0.0–0.1)
Immature Granulocytes: 0 %
Lymphocytes Absolute: 1.3 10*3/uL (ref 0.7–3.1)
Lymphs: 14 %
MCH: 31.1 pg (ref 26.6–33.0)
MCHC: 33.1 g/dL (ref 31.5–35.7)
MCV: 94 fL (ref 79–97)
Monocytes Absolute: 0.7 10*3/uL (ref 0.1–0.9)
Monocytes: 7 %
Neutrophils Absolute: 6.9 10*3/uL (ref 1.4–7.0)
Neutrophils: 76 %
Platelets: 259 10*3/uL (ref 150–450)
RBC: 4.02 x10E6/uL (ref 3.77–5.28)
RDW: 12.1 % (ref 11.7–15.4)
RPR Ser Ql: NONREACTIVE
Rh Factor: POSITIVE
Rubella Antibodies, IGG: 1.05 index (ref >0.99)
WBC: 9.1 10*3/uL (ref 3.4–10.8)

## 2021-06-12 LAB — HEPATITIS C ANTIBODY: Hep C Virus Ab: <0.1 s/co ratio (ref 0.0–0.9)

## 2021-06-12 MED ORDER — DOXYLAMINE-PYRIDOXINE 10-10 MG PO TBEC: 10.0000 mg | DELAYED_RELEASE_TABLET | Freq: Three times a day (TID) | ORAL | 11 refills | Status: DC

## 2021-06-12 NOTE — Progress Notes (Signed)
DICLEGIS RX

## 2021-06-13 LAB — CERVICOVAGINAL ANCILLARY ONLY
Bacterial Vaginitis (gardnerella): POSITIVE — AB
Candida Glabrata: NEGATIVE
Candida Vaginitis: POSITIVE — AB
Chlamydia: POSITIVE — AB
Comment: NEGATIVE
Comment: NEGATIVE
Comment: NEGATIVE
Comment: NEGATIVE
Comment: NEGATIVE
Comment: NORMAL
Neisseria Gonorrhea: NEGATIVE
Trichomonas: NEGATIVE

## 2021-06-13 LAB — CULTURE, OB URINE

## 2021-06-13 LAB — URINE CULTURE, OB REFLEX

## 2021-06-16 ENCOUNTER — Other Ambulatory Visit: Payer: Self-pay

## 2021-06-16 NOTE — Progress Notes (Unsigned)
.  dic

## 2021-06-17 ENCOUNTER — Other Ambulatory Visit: Payer: Self-pay | Admitting: Obstetrics

## 2021-06-17 DIAGNOSIS — A749 Chlamydial infection, unspecified: Secondary | ICD-10-CM

## 2021-06-17 DIAGNOSIS — B3731 Acute candidiasis of vulva and vagina: Secondary | ICD-10-CM

## 2021-06-17 DIAGNOSIS — B9689 Other specified bacterial agents as the cause of diseases classified elsewhere: Secondary | ICD-10-CM

## 2021-06-17 DIAGNOSIS — B373 Candidiasis of vulva and vagina: Secondary | ICD-10-CM

## 2021-06-17 MED ORDER — AZITHROMYCIN 500 MG PO TABS: 1000.0000 mg | ORAL_TABLET | Freq: Once | ORAL | 0 refills | Status: AC

## 2021-06-17 MED ORDER — METRONIDAZOLE 0.75 % VA GEL: 1.0000 | Freq: Two times a day (BID) | VAGINAL | 0 refills | Status: DC

## 2021-06-17 MED ORDER — TERCONAZOLE 80 MG VA SUPP: 80.0000 mg | Freq: Every day | VAGINAL | 0 refills | Status: DC

## 2021-06-23 ENCOUNTER — Encounter: Payer: Self-pay | Admitting: Obstetrics

## 2021-07-03 ENCOUNTER — Inpatient Hospital Stay (HOSPITAL_COMMUNITY)
Admission: AD | Admit: 2021-07-03 | Discharge: 2021-07-03 | Disposition: A | Discharge status: Home / Self Care | Payer: Medicaid Other | Attending: Obstetrics and Gynecology | Admitting: Obstetrics and Gynecology

## 2021-07-03 ENCOUNTER — Other Ambulatory Visit: Payer: Self-pay

## 2021-07-03 ENCOUNTER — Encounter (HOSPITAL_COMMUNITY): Payer: Self-pay | Admitting: Obstetrics and Gynecology

## 2021-07-03 DIAGNOSIS — H539 Unspecified visual disturbance: Secondary | ICD-10-CM | POA: Diagnosis not present

## 2021-07-03 DIAGNOSIS — R109 Unspecified abdominal pain: Secondary | ICD-10-CM | POA: Insufficient documentation

## 2021-07-03 DIAGNOSIS — Z3A13 13 weeks gestation of pregnancy: Secondary | ICD-10-CM | POA: Insufficient documentation

## 2021-07-03 DIAGNOSIS — Y929 Unspecified place or not applicable: Secondary | ICD-10-CM | POA: Diagnosis not present

## 2021-07-03 DIAGNOSIS — Z87891 Personal history of nicotine dependence: Secondary | ICD-10-CM | POA: Insufficient documentation

## 2021-07-03 DIAGNOSIS — O99321 Drug use complicating pregnancy, first trimester: Secondary | ICD-10-CM | POA: Diagnosis not present

## 2021-07-03 DIAGNOSIS — W19XXXA Unspecified fall, initial encounter: Secondary | ICD-10-CM | POA: Insufficient documentation

## 2021-07-03 DIAGNOSIS — O9A211 Injury, poisoning and certain other consequences of external causes complicating pregnancy, first trimester: Secondary | ICD-10-CM | POA: Diagnosis not present

## 2021-07-03 DIAGNOSIS — F1290 Cannabis use, unspecified, uncomplicated: Secondary | ICD-10-CM | POA: Diagnosis not present

## 2021-07-03 DIAGNOSIS — Z79899 Other long term (current) drug therapy: Secondary | ICD-10-CM | POA: Insufficient documentation

## 2021-07-03 DIAGNOSIS — O26891 Other specified pregnancy related conditions, first trimester: Secondary | ICD-10-CM | POA: Diagnosis present

## 2021-07-03 DIAGNOSIS — Y939 Activity, unspecified: Secondary | ICD-10-CM | POA: Diagnosis not present

## 2021-07-03 LAB — URINALYSIS, ROUTINE W REFLEX MICROSCOPIC
Bilirubin Urine: NEGATIVE
Glucose, UA: NEGATIVE mg/dL
Hgb urine dipstick: NEGATIVE
Ketones, ur: NEGATIVE mg/dL
Nitrite: NEGATIVE
Protein, ur: NEGATIVE mg/dL
Specific Gravity, Urine: 1.027 (ref 1.005–1.030)
pH: 5 (ref 5.0–8.0)

## 2021-07-03 LAB — RAPID URINE DRUG SCREEN, HOSP PERFORMED
Amphetamines: NOT DETECTED
Barbiturates: NOT DETECTED
Benzodiazepines: NOT DETECTED
Cocaine: NOT DETECTED
Opiates: NOT DETECTED
Tetrahydrocannabinol: POSITIVE — AB

## 2021-07-03 MED ORDER — ACETAMINOPHEN 325 MG PO TABS
650.0000 mg | ORAL_TABLET | Freq: Once | ORAL | Status: AC
  Administered 2021-07-03: 650 mg via ORAL
  Filled 2021-07-03: qty 2

## 2021-07-03 NOTE — MAU Provider Note (Signed)
History     CSN: [id number]  Arrival date and time: 07/03/21 1507   Event Date/Time   First Provider Initiated Contact with Patient 07/03/21 1605      Chief Complaint  Patient presents with   Cramping    HPI. Catherine Collins is a 25 y.o. G3P2002 at 68w0dwho presents to MAU for evaluation following a fall two days ago. Patient endorses bilateral lower abdominal discomfort which started yesterday. Pain score is 5-6/10. Pain does not radiate. Movement is an aggravating factor. She has not taken medication or tried other treatments for this complaint.  Patient reports irregular episodes of seeing spots and dots in her field of vision. Onset of complaint about two weeks ago. Patient states episodes are triggered by moving from bright outdoor light to dim indoor spaces. Episodes are not associated with headache. Patient is remote from eye exam. She denies loss of visual field, weakness, syncope.  She denies vaginal bleeding, dysuria, fever or recent illness.  [facility] [place] care [facility]  OB History     Gravida  3   Para  2   Term  2   Preterm      AB      Living  2      SAB      IAB      Ectopic      Multiple  0   Live Births  2           Past Medical History:  Diagnosis Date   Anemia    Anxiety    Depression    Mental disorder    Supervision of other normal pregnancy, antepartum 07/13/2019    Nursing Staff Provider Office Location  [facility] Dating  Uncertain LOVF/64Language   ENGLISH Anatomy [place] Normal Flu Vaccine   Genetic Screen  NIPS: Low risk Female  AFP:    TDaP vaccine    Hgb A1C or  GTT Early  Third trimester  Rhogam  NA   LAB RESULTS  Feeding Plan  both Blood Type B/Positive/-- (08/13 1437)  Contraception  Condoms Antibody Negative (08/13 1437) Circumcision  yes if boy [place]   Past Surgical History:  Procedure Laterality Date   WISDOM TOOTH EXTRACTION      Family History  Problem Relation Age of Onset   Drug abuse Maternal  Grandfather    Diabetes Maternal Grandfather    Hypertension Maternal Grandfather    Vision loss Maternal Grandfather    Cancer Maternal Grandmother    COPD Maternal Grandmother     Social History   Tobacco Use   Smoking status: Former    Packs/day: 0.50    Types: Cigarettes    Quit date: 02/11/2018    Years since quitting: 3.3   Smokeless tobacco: Never   Tobacco comments:    stopped smoking after preg confirm  Vaping Use   Vaping Use: Never used  Substance Use Topics   Alcohol use: Not Currently    Alcohol/week: 0.0 standard drinks   Drug use: No    Allergies:  Allergies  Allergen Reactions   Betamethasone Rash    Facility-Administered Medications Prior to Admission  Medication Dose Route Frequency Provider Last Rate Last Admin   azithromycin (ZITHROMAX) tablet 1,000 mg  1,000 mg Oral Once HShelly Bombard MD       Medications Prior to Admission  Medication Sig Dispense Refill Last Dose   acetaminophen (TYLENOL) 325 MG tablet Take 2 tablets (650 mg total)  by mouth every 4 (four) hours as needed (for pain scale < 4). (Patient not taking: Reported on 07/31/2020) 30 tablet 0    Blood Pressure Monitor KIT 1 kit by Does not apply route once a week. Check BP weekly. Large Cuff DX: Z34.6 1 kit 0    Doxylamine-Pyridoxine (DICLEGIS) 10-10 MG TBEC 1 tab in AM, 1 tab mid afternoon 2 tabs at bedtime. Max dose 4 tabs daily. 100 tablet 5    Doxylamine-Pyridoxine (DICLEGIS) 10-10 MG TBEC Take 10 mg by mouth in the morning, at noon, and at bedtime. 90 tablet 11    Elastic Bandages & Supports (COMFORT FIT MATERNITY SUPP SM) MISC Wear as directed. 1 each 0    methocarbamol (ROBAXIN) 750 MG tablet Take 1 tablet (750 mg total) by mouth 3 (three) times daily as needed (muscle spasm/pain). 15 tablet 0    metroNIDAZOLE (METROGEL VAGINAL) 0.75 % vaginal gel Place 1 Applicatorful vaginally 2 (two) times daily. 70 g 0    Prenat-Fe Poly-Methfol-FA-DHA (VITAFOL ULTRA) 29-0.6-0.4-200 MG CAPS Take 1  capsule by mouth daily before breakfast. 90 capsule 4    sertraline (ZOLOFT) 50 MG tablet Take 1 tablet (50 mg total) by mouth at bedtime. (Patient not taking: Reported on 07/31/2020) 30 tablet 2    terconazole (TERAZOL 3) 80 MG vaginal suppository Place 1 suppository (80 mg total) vaginally at bedtime. 3 suppository 0     Review of Systems  Eyes:  Positive for visual disturbance.  Gastrointestinal:  Positive for abdominal pain.  All other systems reviewed and are negative. Physical Exam   Blood pressure 114/71, pulse 83, temperature 98.9 F (37.2 C), temperature source Oral, resp. rate 18, height _0  (1.575 m), weight 56.1 kg, last menstrual period 03/13/2021, SpO2 100 %, unknown if currently breastfeeding.  Physical Exam Vitals and nursing note reviewed. Exam conducted with a chaperone present.  Constitutional:      Appearance: Normal appearance. She is not ill-appearing.  Cardiovascular:     Rate and Rhythm: Normal rate and regular rhythm.     Pulses: Normal pulses.     Heart sounds: Normal heart sounds.  Pulmonary:     Effort: Pulmonary effort is normal.     Breath sounds: Normal breath sounds.  Abdominal:     General: Abdomen is flat. Bowel sounds are normal.     Tenderness: There is no abdominal tenderness. There is no right CVA tenderness, left CVA tenderness or guarding.  Skin:    Capillary Refill: Capillary refill takes less than 2 seconds.  Neurological:     Mental Status: She is alert and oriented to person, place, and time.  Psychiatric:        Mood and Affect: Mood normal.        Behavior: Behavior normal.        Thought Content: Thought content normal.        Judgment: Judgment normal.    MAU Course  Procedures  Orders Placed This Encounter  Procedures   Urinalysis, Routine w reflex microscopic Urine, Clean Catch   Rapid urine drug screen (hospital performed)   Discharge patient   Patient Vitals for the past 24 hrs:  BP Temp Temp src Pulse Resp SpO2  Height Weight  07/03/21 1650 112/73 98.6 F (37 C) -- 77 18 96 % -- --  07/03/21 1550 114/71 98.9 F (37.2 C) Oral 83 18 100 % -- --  07/03/21 1533 115/76 98.7 F (37.1 C) Oral 92 16 98 % -- --  07/03/21 1525 -- -- -- -- -- -- _0  (1.575 m) 56.1 kg   Results for orders placed or performed during the hospital encounter of 07/03/21 (from the past 24 hour(s))  Urinalysis, Routine w reflex microscopic Urine, Clean Catch     Status: Abnormal   Collection Time: 07/03/21  3:47 PM  Result Value Ref Range   Color, Urine YELLOW YELLOW   APPearance HAZY (A) CLEAR   Specific Gravity, Urine 1.027 1.005 - 1.030   pH 5.0 5.0 - 8.0   Glucose, UA NEGATIVE NEGATIVE mg/dL   Hgb urine dipstick NEGATIVE NEGATIVE   Bilirubin Urine NEGATIVE NEGATIVE   Ketones, ur NEGATIVE NEGATIVE mg/dL   Protein, ur NEGATIVE NEGATIVE mg/dL   Nitrite NEGATIVE NEGATIVE   Leukocytes,Ua MODERATE (A) NEGATIVE   RBC / HPF 0-5 0 - 5 RBC/hpf   WBC, UA 6-10 0 - 5 WBC/hpf   Bacteria, UA RARE (A) NONE SEEN   Squamous Epithelial / LPF 0-5 0 - 5   Mucus PRESENT   Rapid urine drug screen (hospital performed)     Status: Abnormal   Collection Time: 07/03/21  3:47 PM  Result Value Ref Range   Opiates NONE DETECTED NONE DETECTED   Cocaine NONE DETECTED NONE DETECTED   Benzodiazepines NONE DETECTED NONE DETECTED   Amphetamines NONE DETECTED NONE DETECTED   Tetrahydrocannabinol POSITIVE (A) NONE DETECTED   Barbiturates NONE DETECTED NONE DETECTED   Meds ordered this encounter  Medications   acetaminophen (TYLENOL) tablet 650 mg   Assessment and Plan  --25 y.o. [id number] at [redacted]w[redacted]d --FHT 154 by Doppler --S/p fall 07/01/2021. No acute concerns --THC positive , abstinence advised --Pain improving with Tylenol --Pain score improved to 3/10 prior to discharge --Discharge home in stable condition  SDarlina Rumpf CNM 07/03/2021, 6:45 PM

## 2021-07-03 NOTE — MAU Note (Signed)
DONYEA GAFFORD is a 25 y.o. at [redacted]w[redacted]d here in MAU reporting: states the main reason she is here is to make sure the baby is okay because she fell on Tuesday. Also has been seeing spots and is having some visual problems in her right eye. Having some lower abdominal cramping.   Onset of complaint: ongoing  Pain score: 6/10, intermittent, no pain currently  Vitals:   07/03/21 1533  BP: 115/76  Pulse: 92  Resp: 16  Temp: 98.7 F (37.1 C)  SpO2: 98%     FHT:154  Lab orders placed from triage: UA

## 2021-07-16 ENCOUNTER — Other Ambulatory Visit: Payer: Self-pay

## 2021-07-16 ENCOUNTER — Ambulatory Visit (INDEPENDENT_AMBULATORY_CARE_PROVIDER_SITE_OTHER): Payer: Medicaid Other | Admitting: Obstetrics

## 2021-07-16 VITALS — BP 111/77 | HR 92 | Wt 122.4 lb

## 2021-07-16 DIAGNOSIS — Z3480 Encounter for supervision of other normal pregnancy, unspecified trimester: Secondary | ICD-10-CM

## 2021-07-16 NOTE — Progress Notes (Signed)
Subjective:  Catherine Collins is a 25 y.o. G3P2002 at [redacted]w[redacted]d being seen today for ongoing prenatal care.  She is currently monitored for the following issues for this low-risk pregnancy and has History of depression; History of prior pregnancy with IUGR newborn; History of tobacco use; and Marijuana use on their problem list.  Patient reports nausea and constipstion .  Contractions: Not present. Vag. Bleeding: None.   . Denies leaking of fluid.   The following portions of the patient's history were reviewed and updated as appropriate: allergies, current medications, past family history, past medical history, past social history, past surgical history and problem list. Problem list updated.  Objective:   Vitals:   07/16/21 1140  BP: 111/77  Pulse: 92  Weight: 122 lb 6.4 oz (55.5 kg)    Fetal Status:           General:  Alert, oriented and cooperative. Patient is in no acute distress.  Skin: Skin is warm and dry. No rash noted.   Cardiovascular: Normal heart rate noted  Respiratory: Normal respiratory effort, no problems with respiration noted  Abdomen: Soft, gravid, appropriate for gestational age. Pain/Pressure: Present     Pelvic:  Cervical exam deferred        Extremities: Normal range of motion.  Edema: None  Mental Status: Normal mood and affect. Normal behavior. Normal judgment and thought content.   Urinalysis:      Assessment and Plan:  Pregnancy: G3P2002 at [redacted]w[redacted]d  There are no diagnoses linked to this encounter. Preterm labor symptoms and general obstetric precautions including but not limited to vaginal bleeding, contractions, leaking of fluid and fetal movement were reviewed in detail with the patient. Please refer to After Visit Summary for other counseling recommendations.   Return in about 2 weeks (around 07/30/2021) for ROB.   Brock Bad, MD  07/18/21

## 2021-07-16 NOTE — Progress Notes (Signed)
Pt not feeling fetal movement yet, reports previously having constipation and some irritability.

## 2021-07-30 ENCOUNTER — Ambulatory Visit (INDEPENDENT_AMBULATORY_CARE_PROVIDER_SITE_OTHER): Payer: Medicaid Other | Admitting: Women's Health

## 2021-07-30 ENCOUNTER — Other Ambulatory Visit: Payer: Self-pay

## 2021-07-30 VITALS — BP 114/73 | HR 96 | Wt 123.0 lb

## 2021-07-30 DIAGNOSIS — O98819 Other maternal infectious and parasitic diseases complicating pregnancy, unspecified trimester: Secondary | ICD-10-CM | POA: Insufficient documentation

## 2021-07-30 DIAGNOSIS — A749 Chlamydial infection, unspecified: Secondary | ICD-10-CM

## 2021-07-30 DIAGNOSIS — Z3A16 16 weeks gestation of pregnancy: Secondary | ICD-10-CM

## 2021-07-30 DIAGNOSIS — O98811 Other maternal infectious and parasitic diseases complicating pregnancy, first trimester: Secondary | ICD-10-CM

## 2021-07-30 DIAGNOSIS — Z3480 Encounter for supervision of other normal pregnancy, unspecified trimester: Secondary | ICD-10-CM

## 2021-07-30 NOTE — Patient Instructions (Addendum)
Maternity Assessment Unit (MAU)  The Maternity Assessment Unit (MAU) is located at the [facility]. The address is: [address], [address], [place], [place] [address]. Please see map below for additional directions.    The Maternity Assessment Unit is designed to help you during your pregnancy, and for up to 6 weeks after delivery, with any pregnancy- or postpartum-related emergencies, if you think you are in labor, or if your water has broken. For example, if you experience nausea and vomiting, vaginal bleeding, severe abdominal or pelvic pain, elevated blood pressure or other problems related to your pregnancy or postpartum time, please come to the Maternity Assessment Unit for assistance.       Preterm Labor The normal length of a pregnancy is 39-41 weeks. Preterm labor is when labor starts before 37 completed weeks of pregnancy. Babies who are born prematurely and survive may not be fully developed and may be at an increased risk for long-term problems such as cerebral palsy, developmental delays, and vision and hearing problems. Babies who are born too early may have problems soon after birth. Premature babies may have problems regulating blood sugar, body temperature, heart rate, and breathing rate. These babies often have trouble with feeding. The risk of having problems is highest for babies who are born before 34 weeks of pregnancy. What are the causes? The exact cause of this condition is not known. What increases the risk? You are more likely to have preterm labor if you have certain risk factors that relate to your medical history, problems with present and past pregnancies, and lifestyle factors. Medical history You have abnormalities of the uterus, including a short cervix. You have STIs (sexually transmitted infections) or other infections of the urinary tract and the vagina. You have chronic illnesses, such as blood clotting  problems, diabetes, or high blood pressure. You are overweight or underweight. Present and past pregnancies You have had preterm labor before. You are pregnant with twins or other multiples. You have been diagnosed with a condition in which the placenta covers your cervix (placenta previa). You waited less than 18 months between giving birth and becoming pregnant again. Your unborn baby has some abnormalities. You have vaginal bleeding during pregnancy. You became pregnant through in vitro fertilization (IVF). Lifestyle and environmental factors You use tobacco products or drink alcohol. You use drugs. You have stress and no social support. You experience domestic violence. You are exposed to certain chemicals or environmental pollutants. Other factors You are younger than age 17 or older than age 35. What are the signs or symptoms? Symptoms of this condition include: Cramps similar to those that can happen during a menstrual period. The cramps may happen with diarrhea. Pain in the abdomen or lower back. Regular contractions that may feel like tightening of the abdomen. A feeling of increased pressure in the pelvis. Increased watery or bloody mucus discharge from the vagina. Water breaking (ruptured amniotic sac). How is this diagnosed? This condition is diagnosed based on: Your medical history and a physical exam. A pelvic exam. An ultrasound. Monitoring your uterus for contractions. Other tests, including: A swab of the cervix to check for a chemical called fetal fibronectin. Urine tests. How is this treated? Treatment for this condition depends on the length of your pregnancy, your condition, and the health of your baby. Treatment may include: Taking medicines, such as: Hormone medicines. These may be given early in pregnancy to help support the pregnancy. Medicines to stop   contractions. Medicines to help mature the baby's lungs. These may be prescribed if the risk of  delivery is high. Medicines to help protect your baby from brain and nerve complications such as cerebral palsy. Bed rest. If the labor happens before 34 weeks of pregnancy, you may need to stay in the hospital. Delivery of the baby. Follow these instructions at home:  Do not use any products that contain nicotine or tobacco. These products include cigarettes, chewing tobacco, and vaping devices, such as e-cigarettes. If you need help quitting, ask your health care provider. Do not drink alcohol. Take over-the-counter and prescription medicines only as told by your health care provider. Rest as told by your health care provider. Return to your normal activities as told by your health care provider. Ask your health care provider what activities are safe for you. Keep all follow-up visits. This is important. How is this prevented? To increase your chance of having a full-term pregnancy: Do not use drugs or take medicines that have not been prescribed to you during your pregnancy. Talk with your health care provider before taking any herbal supplements, even if you have been taking them regularly. Make sure you gain a healthy amount of weight during your pregnancy. Watch for infection. If you think that you might have an infection, get it checked right away. Symptoms of infection may include: Fever. Abnormal vaginal discharge or discharge that smells bad. Pain or burning with urination. Needing to urinate urgently. Frequently urinating or passing small amounts of urine frequently. Blood in your urine or urine that smells bad or unusual. Where to find more information U.S. Department of Health and Cytogeneticist on Women's Health: http://hoffman.com/ [employer]: www.acog.org Centers for Disease Control and Prevention, Preterm Birth: FootballExhibition.com.br Contact a health care provider if: You think you are going into preterm labor. You have signs  or symptoms of preterm labor. You have symptoms of infection. Get help right away if: You are having regular, painful contractions every 5 minutes or less. Your water breaks. Summary Preterm labor is labor that starts before you reach 37 weeks of pregnancy. Delivering your baby early increases your baby's risk of developing long-term problems. You are more likely to have preterm labor if you have certain risk factors that relate to your medical history, problems with present and past pregnancies, and lifestyle factors. Keep all follow-up visits. This is important. Contact a health care provider if you have signs or symptoms of preterm labor. This information is not intended to replace advice given to you by your health care provider. Make sure you discuss any questions you have with your health care provider. Document Revised: 11/19/2020 Document Reviewed: 11/19/2020 Elsevier Patient Education  2022 [employer].       Second Trimester of Pregnancy The second trimester of pregnancy is from week 13 through week 27. This is months 4 through 6 of pregnancy. The second trimester is often a time when you feel your best. Your body has adjusted to being pregnant, and you begin to feel better physically. During the second trimester: Morning sickness has lessened or stopped completely. You may have more energy. You may have an increase in appetite. The second trimester is also a time when the unborn baby (fetus) is growing rapidly. At the end of the sixth month, the fetus may be up to 12 inches long and weigh about 1 pounds. You will likely begin to feel the baby move (quickening) between 16 and 20  weeks of pregnancy. Body changes during your second trimester Your body continues to go through many changes during your second trimester. The changes vary and generally return to normal after the baby is born. Physical changes Your weight will continue to increase. You will notice your lower  abdomen bulging out. You may begin to get stretch marks on your hips, abdomen, and breasts. Your breasts will continue to grow and to become tender. Dark spots or blotches (chloasma or mask of pregnancy) may develop on your face. A dark line from your belly button to the pubic area (linea nigra) may appear. You may have changes in your hair. These can include thickening of your hair, rapid growth, and changes in texture. Some people also have hair loss during or after pregnancy, or hair that feels dry or thin. Health changes You may develop headaches. You may have heartburn. You may develop constipation. You may develop hemorrhoids or swollen, bulging veins (varicose veins). Your gums may bleed and may be sensitive to brushing and flossing. You may urinate more often because the fetus is pressing on your bladder. You may have back pain. This is caused by: Weight gain. Pregnancy hormones that are relaxing the joints in your pelvis. A shift in weight and the muscles that support your balance. Follow these instructions at home: Medicines Follow your health care provider's instructions regarding medicine use. Specific medicines may be either safe or unsafe to take during pregnancy. Do not take any medicines unless approved by your health care provider. Take a prenatal vitamin that contains at least 600 micrograms (mcg) of folic acid. Eating and drinking Eat a healthy diet that includes fresh fruits and vegetables, whole grains, good sources of protein such as meat, eggs, or tofu, and low-fat dairy products. Avoid raw meat and unpasteurized juice, milk, and cheese. These carry germs that can harm you and your baby. You may need to take these actions to prevent or treat constipation: Drink enough fluid to keep your urine pale yellow. Eat foods that are high in fiber, such as beans, whole grains, and fresh fruits and vegetables. Limit foods that are high in fat and processed sugars, such as fried  or sweet foods. Activity Exercise only as directed by your health care provider. Most people can continue their usual exercise routine during pregnancy. Try to exercise for 30 minutes at least 5 days a week. Stop exercising if you develop contractions in your uterus. Stop exercising if you develop pain or cramping in the lower abdomen or lower back. Avoid exercising if it is very hot or humid or if you are at a high altitude. Avoid heavy lifting. If you choose to, you may have sex unless your health care provider tells you not to. Relieving pain and discomfort Wear a supportive bra to prevent discomfort from breast tenderness. Take warm sitz baths to soothe any pain or discomfort caused by hemorrhoids. Use hemorrhoid cream if your health care provider approves. Rest with your legs raised (elevated) if you have leg cramps or low back pain. If you develop varicose veins: Wear support hose as told by your health care provider. Elevate your feet for 15 minutes, 3-4 times a day. Limit salt in your diet. Safety Wear your seat belt at all times when driving or riding in a car. Talk with your health care provider if someone is verbally or physically abusive to you. Lifestyle Do not use hot tubs, steam rooms, or saunas. Do not douche. Do not use tampons or scented  sanitary pads. Avoid cat litter boxes and soil used by cats. These carry germs that can cause birth defects in the baby and possibly loss of the fetus by miscarriage or stillbirth. Do not use herbal remedies, alcohol, illegal drugs, or medicines that are not approved by your health care provider. Chemicals in these products can harm your baby. Do not use any products that contain nicotine or tobacco, such as cigarettes, e-cigarettes, and chewing tobacco. If you need help quitting, ask your health care provider. General instructions During a routine prenatal visit, your health care provider will do a physical exam and other tests. He or she  will also discuss your overall health. Keep all follow-up visits. This is important. Ask your health care provider for a referral to a local prenatal education class. Ask for help if you have counseling or nutritional needs during pregnancy. Your health care provider can offer advice or refer you to specialists for help with various needs. Where to find more information American Pregnancy Association: americanpregnancy.org [employer] of [employer]ians and Gynecologists: https://www.todd-brady.net/ Office on [employer] Health: MightyReward.co.nz Contact a health care provider if you have: A headache that does not go away when you take medicine. Vision changes or you see spots in front of your eyes. Mild pelvic cramps, pelvic pressure, or nagging pain in the abdominal area. Persistent nausea, vomiting, or diarrhea. A bad-smelling vaginal discharge or foul-smelling urine. Pain when you urinate. Sudden or extreme swelling of your face, hands, ankles, feet, or legs. A fever. Get help right away if you: Have fluid leaking from your vagina. Have spotting or bleeding from your vagina. Have severe abdominal cramping or pain. Have difficulty breathing. Have chest pain. Have fainting spells. Have not felt your baby move for the time period told by your health care provider. Have new or increased pain, swelling, or redness in an arm or leg. Summary The second trimester of pregnancy is from week 13 through week 27 (months 4 through 6). Do not use herbal remedies, alcohol, illegal drugs, or medicines that are not approved by your health care provider. Chemicals in these products can harm your baby. Exercise only as directed by your health care provider. Most people can continue their usual exercise routine during pregnancy. Keep all follow-up visits. This is important. This information is not intended to replace advice given to you by your health care provider. Make sure you  discuss any questions you have with your health care provider. Document Revised: 04/24/2020 Document Reviewed: 02/29/2020 Elsevier Patient Education  2022 Elsevier Inc.       Alpha-Fetoprotein Test Why am I having this test? The alpha-fetoprotein test is a lab test most commonly used for pregnant women to help screen for birth defects in their unborn baby. It can be used to screen for chromosome (DNA) abnormalities, problems with the brain or spinal cord, or problems with the abdominal wall of the unborn baby (fetus). The alpha-fetoprotein test may also be done for men or nonpregnant women to check for certain cancers. What is being tested? This test measures the amount of alpha-fetoprotein (AFP) in your blood. AFP is a protein that is made by the liver. Levels can be detected in the mother's blood during pregnancy, starting at 10 weeks and peaking at 16-18 weeks of the pregnancy. Abnormal levels can sometimes be a sign of a birth defect in the baby. Certain cancers can cause a high level of AFP in men and nonpregnant women. What kind of sample is taken? A blood sample  is required for this test. It is usually collected by inserting a needle into a blood vessel. How are the results reported? Your test results will be reported as values. Your health care provider will compare your results to normal ranges that were established after testing a large group of people (reference values). Reference values may vary among labs and hospitals. For this test, common reference values are: Adult: Less than 40 ng/mL or less than 40 mcg/L (SI units). Child younger than 1 year: Less than 30 ng/mL. If you are pregnant, the values may also vary based on how long you have been pregnant. What do the results mean? Results that are above the reference values in pregnant women may indicate the following for the baby: Neural tube defects, such as abnormalities of the spinal cord or brain. Abdominal wall  defects. Multiple pregnancy such as twins. Fetal distress or fetal death. Results that are above the reference values in men or nonpregnant women may indicate: Reproductive cancers, such as ovarian or testicular cancer. Liver cancer. Liver cell death. Other types of cancer. Very low levels of AFP in pregnant women may indicate Down syndrome for the baby. Talk with your health care provider about what your results mean. Questions to ask your health care provider Ask your health care provider, or the department that is doing the test: When will my results be ready? How will I get my results? What are my treatment options? What other tests do I need? What are my next steps? Summary The alpha-fetoprotein test is done on pregnant women to help screen for birth defects in their unborn baby. Certain cancers can cause a high level of AFP in men and nonpregnant women. For this test, a blood sample is usually collected by inserting a needle into a blood vessel. Talk with your health care provider about what your results mean. This information is not intended to replace advice given to you by your health care provider. Make sure you discuss any questions you have with your health care provider. Document Revised: 06/07/2020 Document Reviewed: 06/07/2020 Elsevier Patient Education  2022 [employer].

## 2021-07-30 NOTE — Progress Notes (Signed)
Pt states she just took the Azithromycin for +Chlamydia last Monday on 07/21/21. PHQ 9: 5, GAD 7: 10

## 2021-07-30 NOTE — Progress Notes (Signed)
Subjective:  Catherine Collins is a 25 y.o. G3P2002 at [redacted]w[redacted]d being seen today for ongoing prenatal care.  She is currently monitored for the following issues for this low-risk pregnancy and has History of depression; History of prior pregnancy with IUGR newborn; History of tobacco use; Marijuana use; Supervision of other normal pregnancy, antepartum; and Chlamydia infection affecting pregnancy on their problem list.  Patient reports no complaints.  Contractions: Not present. Vag. Bleeding: None.   . Denies leaking of fluid.   The following portions of the patient's history were reviewed and updated as appropriate: allergies, current medications, past family history, past medical history, past social history, past surgical history and problem list. Problem list updated.  Objective:   Vitals:   07/30/21 1115  BP: 114/73  Pulse: 96  Weight: 123 lb (55.8 kg)    Fetal Status: Fetal Heart Rate (bpm): 152         General:  Alert, oriented and cooperative. Patient is in no acute distress.  Skin: Skin is warm and dry. No rash noted.   Cardiovascular: Normal heart rate noted  Respiratory: Normal respiratory effort, no problems with respiration noted  Abdomen: Soft, gravid, appropriate for gestational age. Pain/Pressure: Present     Pelvic: Vag. Bleeding: None     Cervical exam deferred        Extremities: Normal range of motion.  Edema: None  Mental Status: Normal mood and affect. Normal behavior. Normal judgment and thought content.   Urinalysis:      Assessment and Plan:  Pregnancy: G3P2002 at [redacted]w[redacted]d  1. Supervision of other normal pregnancy, antepartum -AFP today -anatomy scan ordered today  2. [redacted] weeks gestation of pregnancy  3. Chlamydia infection affecting pregnancy in first trimester -pt took medication 07/21/2021, TOC next visit  Preterm labor symptoms and general obstetric precautions including but not limited to vaginal bleeding, contractions, leaking of fluid and fetal  movement were reviewed in detail with the patient. I discussed the assessment and treatment plan with the patient. The patient was provided an opportunity to ask questions and all were answered. The patient agreed with the plan and demonstrated an understanding of the instructions. The patient was advised to call back or seek an in-person office evaluation/go to MAU at [facility] for any urgent or concerning symptoms. Please refer to After Visit Summary for other counseling recommendations.  Return in about 4 weeks (around 08/27/2021) for in-person LOB/APP OK, needs anatomy scheduled.   Daanya Lanphier, Odie Sera, NP

## 2021-08-01 LAB — AFP, SERUM, OPEN SPINA BIFIDA
AFP MoM: 1.49
AFP Value: 71.2 ng/mL
Gest. Age on Collection Date: 16.6 weeks
Maternal Age At EDD: 23.4 yr
OSBR Risk 1 IN: 5617
Test Results:: NEGATIVE
Weight: 123 [lb_av]

## 2021-08-21 ENCOUNTER — Other Ambulatory Visit: Payer: Self-pay

## 2021-08-21 ENCOUNTER — Ambulatory Visit: Payer: Medicaid Other | Admitting: *Deleted

## 2021-08-21 ENCOUNTER — Encounter: Payer: Self-pay | Admitting: *Deleted

## 2021-08-21 ENCOUNTER — Ambulatory Visit: Payer: Medicaid Other | Attending: Women's Health

## 2021-08-21 ENCOUNTER — Other Ambulatory Visit: Payer: Self-pay | Admitting: *Deleted

## 2021-08-21 VITALS — BP 125/80 | HR 81

## 2021-08-21 DIAGNOSIS — Z3480 Encounter for supervision of other normal pregnancy, unspecified trimester: Secondary | ICD-10-CM | POA: Diagnosis present

## 2021-08-21 DIAGNOSIS — Z8759 Personal history of other complications of pregnancy, childbirth and the puerperium: Secondary | ICD-10-CM

## 2021-08-22 ENCOUNTER — Other Ambulatory Visit: Payer: Self-pay

## 2021-08-22 ENCOUNTER — Encounter (HOSPITAL_COMMUNITY): Payer: Self-pay | Admitting: Obstetrics and Gynecology

## 2021-08-22 ENCOUNTER — Inpatient Hospital Stay (HOSPITAL_COMMUNITY): Payer: Medicaid Other

## 2021-08-22 ENCOUNTER — Inpatient Hospital Stay (HOSPITAL_COMMUNITY)
Admission: AD | Admit: 2021-08-22 | Discharge: 2021-08-23 | Disposition: A | Discharge status: Home / Self Care | Payer: Medicaid Other | Attending: Emergency Medicine | Admitting: Emergency Medicine

## 2021-08-22 DIAGNOSIS — O99891 Other specified diseases and conditions complicating pregnancy: Secondary | ICD-10-CM

## 2021-08-22 DIAGNOSIS — R1031 Right lower quadrant pain: Secondary | ICD-10-CM | POA: Insufficient documentation

## 2021-08-22 DIAGNOSIS — R1032 Left lower quadrant pain: Secondary | ICD-10-CM | POA: Insufficient documentation

## 2021-08-22 DIAGNOSIS — Y92410 Unspecified street and highway as the place of occurrence of the external cause: Secondary | ICD-10-CM | POA: Diagnosis not present

## 2021-08-22 DIAGNOSIS — Z20822 Contact with and (suspected) exposure to covid-19: Secondary | ICD-10-CM | POA: Insufficient documentation

## 2021-08-22 DIAGNOSIS — Z79899 Other long term (current) drug therapy: Secondary | ICD-10-CM | POA: Diagnosis not present

## 2021-08-22 DIAGNOSIS — M549 Dorsalgia, unspecified: Secondary | ICD-10-CM

## 2021-08-22 DIAGNOSIS — T1490XA Injury, unspecified, initial encounter: Secondary | ICD-10-CM

## 2021-08-22 DIAGNOSIS — Z3A20 20 weeks gestation of pregnancy: Secondary | ICD-10-CM

## 2021-08-22 DIAGNOSIS — M542 Cervicalgia: Secondary | ICD-10-CM | POA: Diagnosis not present

## 2021-08-22 DIAGNOSIS — Z87891 Personal history of nicotine dependence: Secondary | ICD-10-CM | POA: Diagnosis not present

## 2021-08-22 DIAGNOSIS — O9A212 Injury, poisoning and certain other consequences of external causes complicating pregnancy, second trimester: Secondary | ICD-10-CM | POA: Insufficient documentation

## 2021-08-22 LAB — COMPREHENSIVE METABOLIC PANEL
ALT: 10 U/L (ref 0–44)
AST: 15 U/L (ref 15–41)
Albumin: 3.1 g/dL — ABNORMAL LOW (ref 3.5–5.0)
Alkaline Phosphatase: 48 U/L (ref 38–126)
Anion gap: 6 (ref 5–15)
BUN: 11 mg/dL (ref 6–20)
CO2: 24 mmol/L (ref 22–32)
Calcium: 8.8 mg/dL — ABNORMAL LOW (ref 8.9–10.3)
Chloride: 105 mmol/L (ref 98–111)
Creatinine, Ser: 0.53 mg/dL (ref 0.44–1.00)
GFR, Estimated: >60 mL/min (ref >60)
Glucose, Bld: 85 mg/dL (ref 70–99)
Potassium: 3.9 mmol/L (ref 3.5–5.1)
Sodium: 135 mmol/L (ref 135–145)
Total Bilirubin: 0.5 mg/dL (ref 0.3–1.2)
Total Protein: 6.3 g/dL — ABNORMAL LOW (ref 6.5–8.1)

## 2021-08-22 LAB — CBC
HCT: 35.9 % — ABNORMAL LOW (ref 36.0–46.0)
Hemoglobin: 11.9 g/dL — ABNORMAL LOW (ref 12.0–15.0)
MCH: 31.7 pg (ref 26.0–34.0)
MCHC: 33.1 g/dL (ref 30.0–36.0)
MCV: 95.7 fL (ref 80.0–100.0)
Platelets: 278 10*3/uL (ref 150–400)
RBC: 3.75 MIL/uL — ABNORMAL LOW (ref 3.87–5.11)
RDW: 12.6 % (ref 11.5–15.5)
WBC: 12.2 10*3/uL — ABNORMAL HIGH (ref 4.0–10.5)
nRBC: 0 % (ref 0.0–0.2)

## 2021-08-22 LAB — ABO/RH: ABO/RH(D): B POS

## 2021-08-22 LAB — RESP PANEL BY RT-PCR (FLU A&B, COVID) ARPGX2
Influenza A by PCR: NEGATIVE
Influenza B by PCR: NEGATIVE
SARS Coronavirus 2 by RT PCR: NEGATIVE

## 2021-08-22 IMAGING — US US MFM OB FOLLOW-UP
1 series · 13 of 28 positions shown · non-contrast
Comparison: none

[Series 1: us mfm ob follow-up · 13 of 43 slices shown]
[im 2/43]
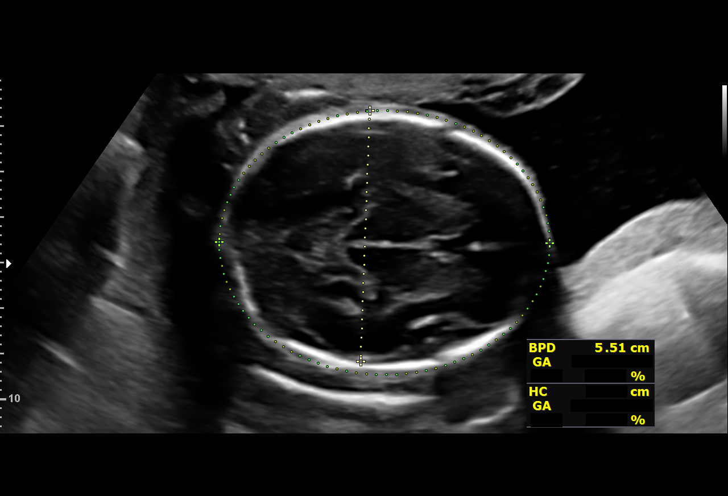
[im 5/43]
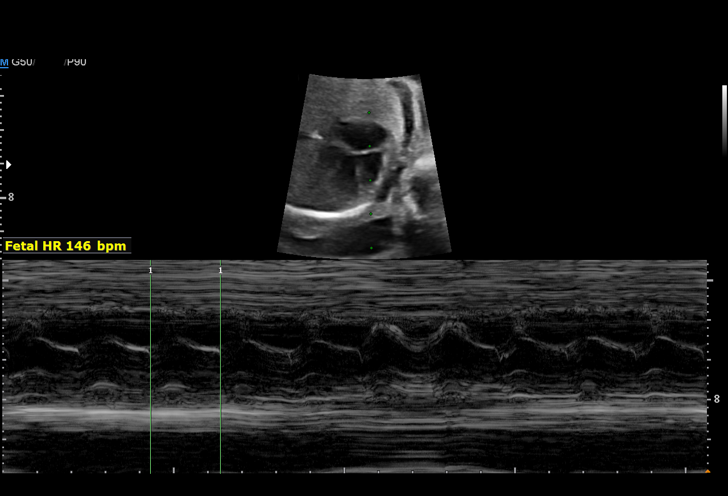
[im 8/43]
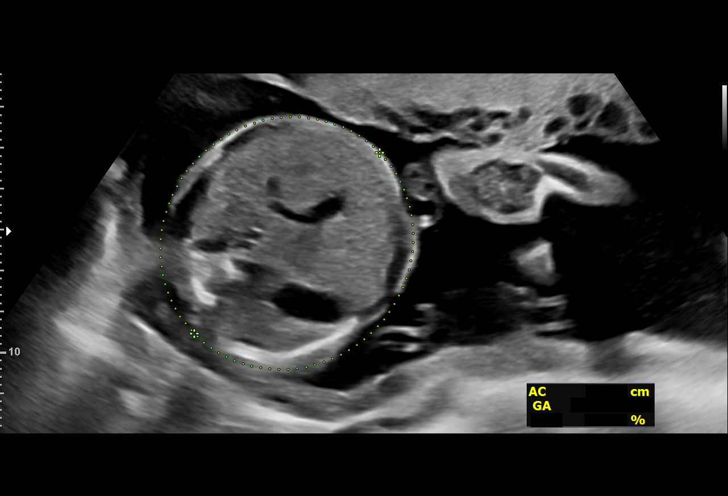
[im 11/43]
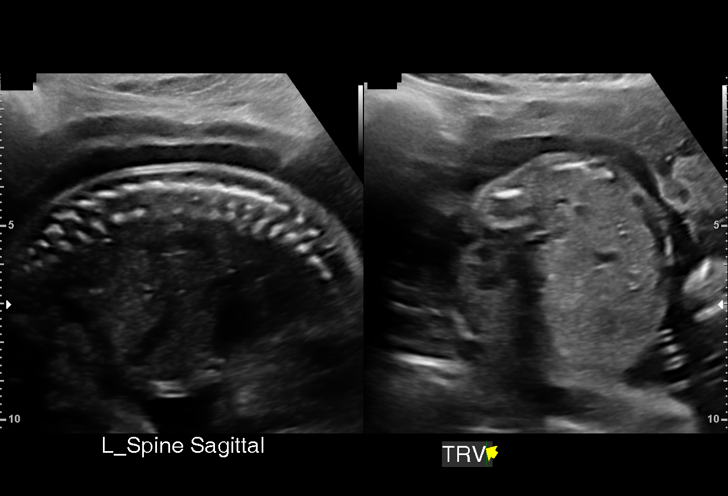
[im 15/43]
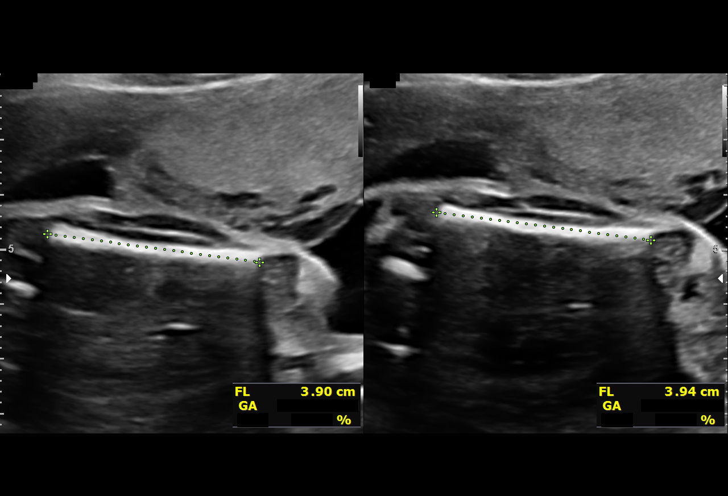
[im 18/43]
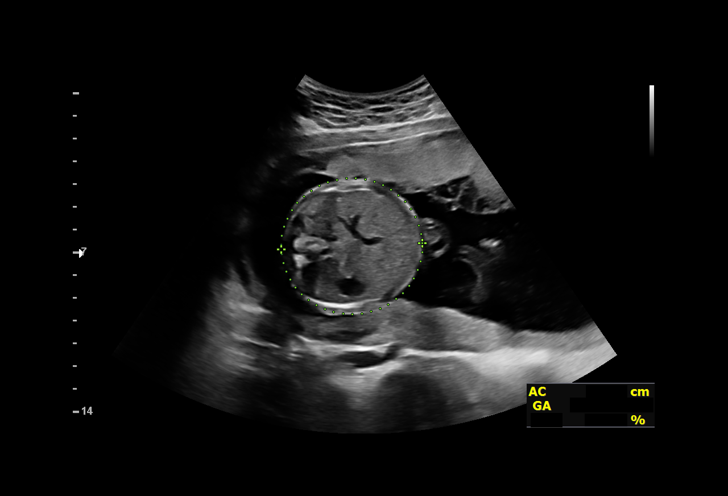
[im 22/43]
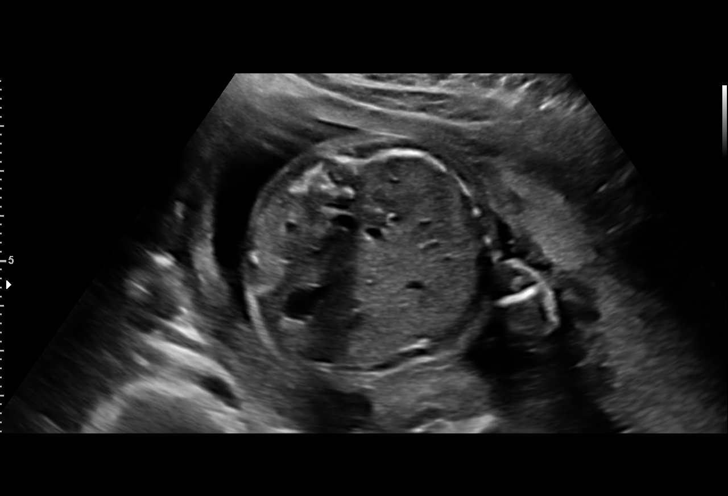
[im 25/43]
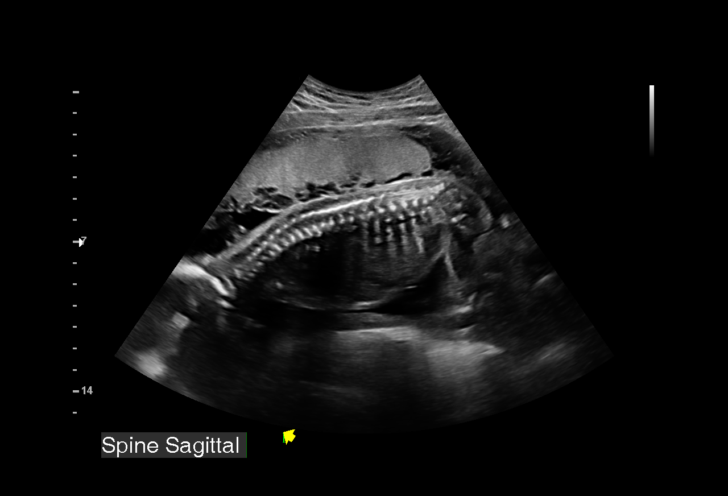
[im 29/43]
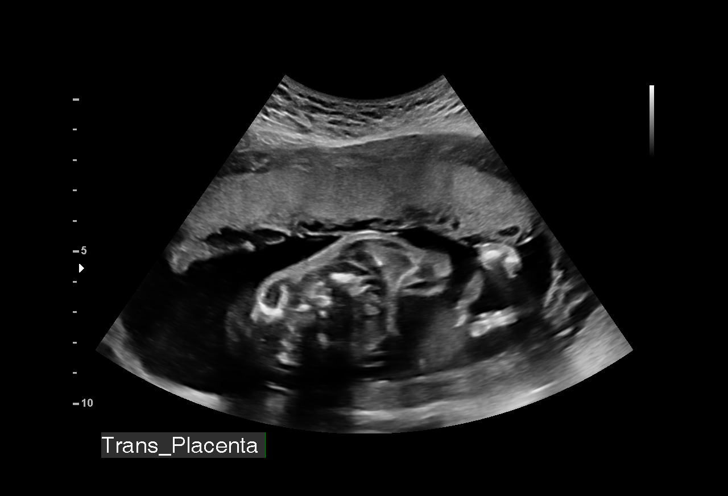
[im 32/43]
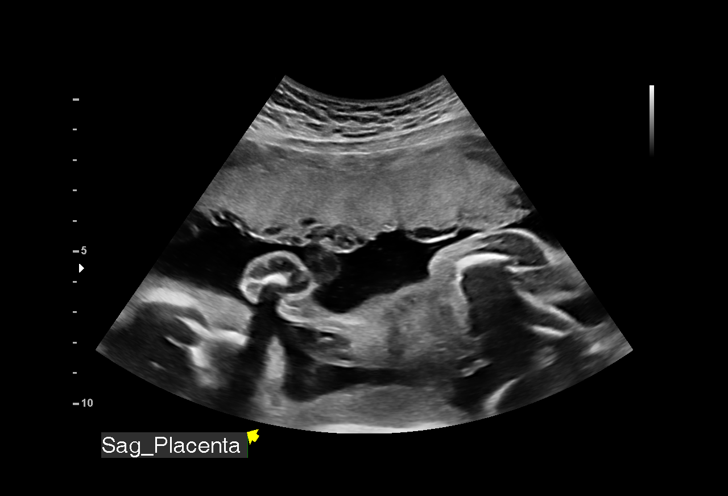
[im 35/43]
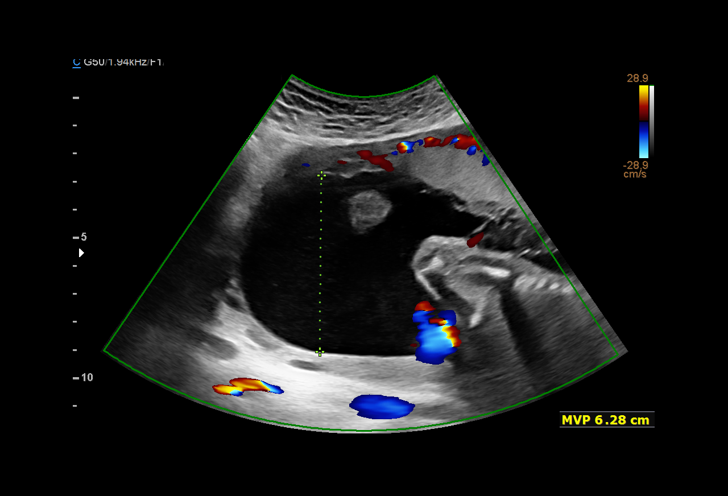
[im 38/43]
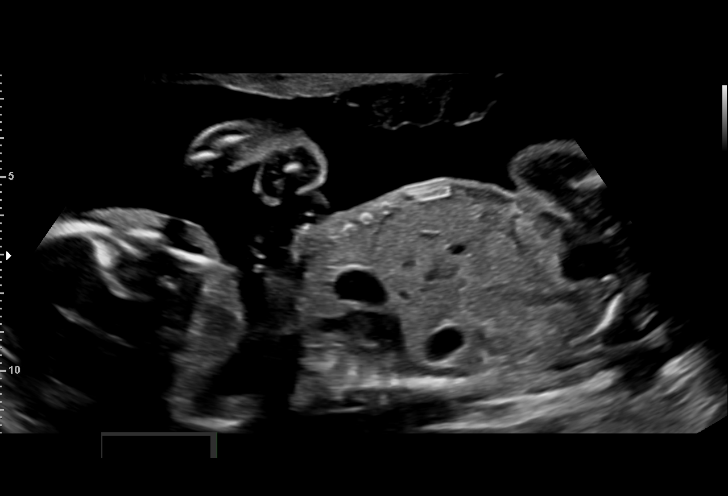
[im 41/43]
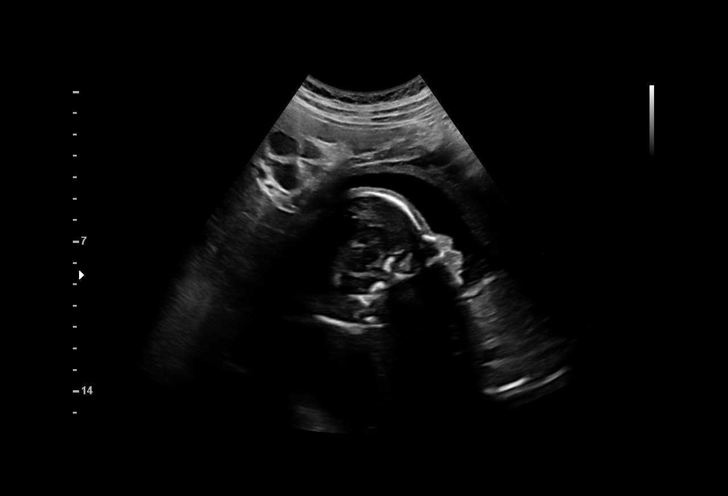

[13 of 28 positions shown; findings below may reference images not displayed]

----------------------------------------------------------------------

 ----------------------------------------------------------------------
Indications

  Encounter for other antenatal screening
  follow-up (low risk NIPS)
  23 weeks gestation of pregnancy
  Poor obstetric history: Previous fetal growth
  restriction (FGR)
  Alcohol use complicating pregnancy, first
  trimester
 ----------------------------------------------------------------------
Fetal Evaluation

 Num Of Fetuses:         1
 Fetal Heart Rate(bpm):  146
 Cardiac Activity:       Observed
 Presentation:           Breech
 Placenta:               Anterior
 P. Cord Insertion:      Previously Visualized

 Amniotic Fluid
 AFI FV:      Within normal limits

                             Largest Pocket(cm)

Biometry

 BPD:        55  mm     G. Age:  22w 5d         26  %    CI:         73.4   %    70 - 86
                                                         FL/HC:      19.8   %    19.2 -
 HC:       204   mm     G. Age:  22w 4d         11  %    HC/AC:      1.06        1.05 -
 AC:      193.3  mm     G. Age:  24w 0d         66  %    FL/BPD:     73.3   %    71 - 87
 FL:       40.3  mm     G. Age:  23w 0d         30  %    FL/AC:      20.8   %    20 - 24
 HUM:      38.6  mm     G. Age:  23w 5d         50  %
 LV:        5.5  mm
 Est. FW:     596  gm      1 lb 5 oz     50  %
OB History

 Gravidity:    2         Term:   1        Prem:   0        SAB:   0
 TOP:          0       Ectopic:  0        Living: 1
Gestational Age

 LMP:           23w 1d        Date:  04/30/19                 EDD:   02/04/20
 U/S Today:     23w 1d                                        EDD:   02/04/20
 Best:          23w 2d     Det. By:  Early Ultrasound         EDD:   02/03/20
                                     (07/13/19)
Anatomy

 Cranium:               Appears normal         Aortic Arch:            Appears normal
 Cavum:                 Previously seen        Ductal Arch:            Previously seen
 Ventricles:            Appears normal         Diaphragm:              Appears normal
 Choroid Plexus:        Previously seen        Stomach:                Appears normal, left
                                                                       sided
 Cerebellum:            Previously seen        Abdomen:                Appears normal
 Posterior Fossa:       Previously seen        Abdominal Wall:         Appears nml (cord
                                                                       insert, abd wall)
 Nuchal Fold:           Previously seen        Cord Vessels:           Previously seen
 Face:                  Orbits previously      Kidneys:                Appear normal
                        seen; profile nwv
 Lips:                  Previously seen        Bladder:                Appears normal
 Thoracic:              Appears normal         Spine:                  Appears normal
 Heart:                 Previously seen        Upper Extremities:      Previously seen
 RVOT:                  Previously seen        Lower Extremities:      Previously seen
 LVOT:                  Appears normal

 Other:  Fetus appears to be a male prev seen. Heels and 5th digit prev
         visualized. Technically difficult due to fetal position.
Cervix Uterus Adnexa

 Cervix
 Length:           3.28  cm.
 Normal appearance by transabdominal scan.
Impression

 Patient returned for completion of fetal anatomy. Amniotic
 fluid is normal and good fetal activity is seen. Fetal growth is
 appropriate for gestational age. Fetal anatomical survey was
 completed and appears normal.
 History of fetal growth restriction.
Recommendations

 -An appointment was made for her to return in 5 weeks for
 fetal growth assessment (and evaluate fetal profile).
                 Ise, Kresimira

## 2021-08-22 MED ORDER — IOHEXOL 300 MG/ML  SOLN
100.0000 mL | Freq: Once | INTRAMUSCULAR | Status: AC | PRN
  Administered 2021-08-22: 100 mL via INTRAVENOUS
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
## 2021-08-22 NOTE — MAU Note (Signed)
RN gave report to ED charge nurse [facility].
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
## 2021-08-23 NOTE — ED Provider Notes (Signed)
Blood pressure 97/61, pulse 67, temperature 98.5 F (36.9 C), temperature source Oral, resp. rate 16, height 5\' 2"  (1.575 m), weight 62 kg, last menstrual period 03/13/2021, SpO2 100 %, unknown if currently breastfeeding.  Assuming care from Dr. 03/15/2021.  In short, Catherine Collins is a 25 y.o. female with a chief complaint of 30 (Level2 [redacted] weeks pregnant) .  Refer to the original H&P for additional details.  The current plan of care is to f/u CT abdomen pelvis with abdominal pain and trauma.  CT unremarkable.  Ultrasound of the abdomen performed previously showing anterior placenta and viable IUP.  Plan for close GYN follow-up.  Discussed ED return precautions.   Optician, dispensing, MD 08/25/21 [phone]

## 2021-08-23 NOTE — ED Notes (Signed)
EDP explained tests results and discharge plan to patient .

## 2021-08-27 ENCOUNTER — Ambulatory Visit (INDEPENDENT_AMBULATORY_CARE_PROVIDER_SITE_OTHER): Payer: Medicaid Other | Admitting: Obstetrics

## 2021-08-27 ENCOUNTER — Other Ambulatory Visit (HOSPITAL_COMMUNITY)
Admission: RE | Admit: 2021-08-27 | Discharge: 2021-08-27 | Disposition: A | Discharge status: Home / Self Care | Payer: Medicaid Other | Source: Ambulatory Visit | Attending: Obstetrics and Gynecology | Admitting: Obstetrics and Gynecology

## 2021-08-27 ENCOUNTER — Encounter: Payer: Self-pay | Admitting: Obstetrics

## 2021-08-27 ENCOUNTER — Encounter: Payer: Medicaid Other | Admitting: Obstetrics and Gynecology

## 2021-08-27 VITALS — BP 105/68 | HR 82 | Wt 129.0 lb

## 2021-08-27 DIAGNOSIS — Z3480 Encounter for supervision of other normal pregnancy, unspecified trimester: Secondary | ICD-10-CM

## 2021-08-27 DIAGNOSIS — O98811 Other maternal infectious and parasitic diseases complicating pregnancy, first trimester: Secondary | ICD-10-CM | POA: Diagnosis not present

## 2021-08-27 DIAGNOSIS — A749 Chlamydial infection, unspecified: Secondary | ICD-10-CM

## 2021-08-27 DIAGNOSIS — O09299 Supervision of pregnancy with other poor reproductive or obstetric history, unspecified trimester: Secondary | ICD-10-CM

## 2021-08-27 LAB — OB RESULTS CONSOLE GC/CHLAMYDIA: Gonorrhea: NEGATIVE
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
## 2021-08-27 NOTE — Progress Notes (Signed)
Subjective:  Catherine Collins is a 25 y.o. G3P2002 at [redacted]w[redacted]d being seen today for ongoing prenatal care.  She is currently monitored for the following issues for this low-risk pregnancy and has History of depression; History of prior pregnancy with IUGR newborn; History of tobacco use; Marijuana use; Supervision of other normal pregnancy, antepartum; and Chlamydia infection affecting pregnancy on their problem list.  Patient reports no complaints.  Contractions: Not present.  .  Movement: Present. Denies leaking of fluid.   The following portions of the patient's history were reviewed and updated as appropriate: allergies, current medications, past family history, past medical history, past social history, past surgical history and problem list. Problem list updated.  Objective:   Vitals:   08/27/21 1445  BP: 105/68  Pulse: 82  Weight: 129 lb (58.5 kg)    Fetal Status:     Movement: Present     General:  Alert, oriented and cooperative. Patient is in no acute distress.  Skin: Skin is warm and dry. No rash noted.   Cardiovascular: Normal heart rate noted  Respiratory: Normal respiratory effort, no problems with respiration noted  Abdomen: Soft, gravid, appropriate for gestational age. Pain/Pressure: Present     Pelvic:  Cervical exam deferred        Extremities: Normal range of motion.     Mental Status: Normal mood and affect. Normal behavior. Normal judgment and thought content.   Urinalysis:      Assessment and Plan:  Pregnancy: G3P2002 at [redacted]w[redacted]d  1. Supervision of other normal pregnancy, antepartum  2. Chlamydia infection affecting pregnancy in first trimester Rx: - Cervicovaginal ancillary only( [place])  3. IUGR (intrauterine growth restriction) in prior pregnancy, pregnant - followed by MFM per protocol with ultrasounds for interval growth and UA Dopplers   Preterm labor symptoms and general obstetric precautions including but not limited to vaginal bleeding,  contractions, leaking of fluid and fetal movement were reviewed in detail with the patient. Please refer to After Visit Summary for other counseling recommendations.   Return in about 4 weeks (around 09/24/2021) for ROB.   Brock Bad, MD  08/27/21

## 2021-08-28 ENCOUNTER — Other Ambulatory Visit: Payer: Self-pay | Admitting: Obstetrics

## 2021-08-28 DIAGNOSIS — B9689 Other specified bacterial agents as the cause of diseases classified elsewhere: Secondary | ICD-10-CM

## 2021-08-28 DIAGNOSIS — N760 Acute vaginitis: Secondary | ICD-10-CM

## 2021-08-28 DIAGNOSIS — B373 Candidiasis of vulva and vagina: Secondary | ICD-10-CM

## 2021-08-28 DIAGNOSIS — B3731 Acute candidiasis of vulva and vagina: Secondary | ICD-10-CM

## 2021-08-28 LAB — CERVICOVAGINAL ANCILLARY ONLY
Bacterial Vaginitis (gardnerella): POSITIVE — AB
Candida Glabrata: NEGATIVE
Candida Vaginitis: POSITIVE — AB
Chlamydia: NEGATIVE
Comment: NEGATIVE
Comment: NEGATIVE
Comment: NEGATIVE
Comment: NEGATIVE
Comment: NEGATIVE
Comment: NORMAL
Neisseria Gonorrhea: NEGATIVE
Trichomonas: NEGATIVE

## 2021-08-28 MED ORDER — METRONIDAZOLE 500 MG PO TABS
500.0000 mg | ORAL_TABLET | Freq: Two times a day (BID) | ORAL | 2 refills | Status: DC
Start: 2021-08-28 — End: 2021-10-22

## 2021-08-28 MED ORDER — TERCONAZOLE 0.4 % VA CREA: 1.0000 | TOPICAL_CREAM | Freq: Every day | VAGINAL | 0 refills | Status: DC

## 2021-09-16 ENCOUNTER — Telehealth: Payer: Self-pay | Admitting: Women's Health

## 2021-09-24 ENCOUNTER — Encounter: Payer: Medicaid Other | Admitting: Women's Health

## 2021-09-25 ENCOUNTER — Other Ambulatory Visit: Payer: Self-pay

## 2021-09-25 ENCOUNTER — Ambulatory Visit: Payer: Medicaid Other | Attending: Obstetrics

## 2021-09-25 ENCOUNTER — Encounter: Payer: Self-pay | Admitting: *Deleted

## 2021-09-25 ENCOUNTER — Other Ambulatory Visit: Payer: Self-pay | Admitting: *Deleted

## 2021-09-25 ENCOUNTER — Ambulatory Visit: Payer: Medicaid Other | Admitting: *Deleted

## 2021-09-25 ENCOUNTER — Encounter: Payer: Medicaid Other | Admitting: Advanced Practice Midwife

## 2021-09-25 VITALS — BP 121/61 | HR 64

## 2021-09-25 DIAGNOSIS — Z8759 Personal history of other complications of pregnancy, childbirth and the puerperium: Secondary | ICD-10-CM | POA: Diagnosis not present

## 2021-09-25 DIAGNOSIS — O99012 Anemia complicating pregnancy, second trimester: Secondary | ICD-10-CM | POA: Diagnosis not present

## 2021-09-25 DIAGNOSIS — O99322 Drug use complicating pregnancy, second trimester: Secondary | ICD-10-CM | POA: Diagnosis not present

## 2021-09-25 DIAGNOSIS — O09292 Supervision of pregnancy with other poor reproductive or obstetric history, second trimester: Secondary | ICD-10-CM | POA: Diagnosis not present

## 2021-09-25 DIAGNOSIS — Z3480 Encounter for supervision of other normal pregnancy, unspecified trimester: Secondary | ICD-10-CM | POA: Insufficient documentation

## 2021-09-25 DIAGNOSIS — Z3A25 25 weeks gestation of pregnancy: Secondary | ICD-10-CM | POA: Diagnosis not present

## 2021-09-25 DIAGNOSIS — O09299 Supervision of pregnancy with other poor reproductive or obstetric history, unspecified trimester: Secondary | ICD-10-CM

## 2021-10-16 ENCOUNTER — Other Ambulatory Visit: Payer: Medicaid Other

## 2021-10-16 ENCOUNTER — Other Ambulatory Visit: Payer: Self-pay

## 2021-10-16 ENCOUNTER — Ambulatory Visit (INDEPENDENT_AMBULATORY_CARE_PROVIDER_SITE_OTHER): Payer: Medicaid Other

## 2021-10-16 VITALS — BP 108/68 | HR 95 | Wt 131.4 lb

## 2021-10-16 DIAGNOSIS — Z3402 Encounter for supervision of normal first pregnancy, second trimester: Secondary | ICD-10-CM

## 2021-10-16 DIAGNOSIS — Z23 Encounter for immunization: Secondary | ICD-10-CM

## 2021-10-16 DIAGNOSIS — Z3A28 28 weeks gestation of pregnancy: Secondary | ICD-10-CM

## 2021-10-16 NOTE — Progress Notes (Signed)
   LOW-RISK PREGNANCY OFFICE VISIT  Patient name: Catherine Collins MRN [id number]  Date of birth: 01-25-1996 Chief Complaint:   Routine Prenatal Visit  Subjective:   Catherine Collins is a 25 y.o. G54P2002 female at [redacted]w[redacted]d with an Estimated Date of Delivery: 01/08/22 being seen today for ongoing management of a low-risk pregnancy aeb has History of depression; History of prior pregnancy with IUGR newborn; History of tobacco use; Marijuana use; Supervision of other normal pregnancy, antepartum; and Chlamydia infection affecting pregnancy on their problem list.  Patient presents today with no complaints.  Patient endorses fetal movement. Patient denies abdominal cramping, but states she was having contractions this morning that was improved with a bath.  Patient denies vaginal concerns including abnormal discharge, leaking of fluid, and bleeding.  Contractions: Not present. Vag. Bleeding: None.  Movement: Present.  Reviewed past medical,surgical, social, obstetrical and family history as well as problem list, medications and allergies.  Objective   Vitals:   10/16/21 0833  BP: 108/68  Pulse: 95  Weight: 131 lb 6.4 oz (59.6 kg)  Body mass index is 24.03 kg/m.  Total Weight Gain:16 lb 6.4 oz (7.439 kg)         Physical Examination:   General appearance: Well appearing, and in no distress  Mental status: Alert, oriented to person, place, and time  Skin: Warm & dry  Cardiovascular: Normal heart rate noted  Respiratory: Normal respiratory effort, no distress  Abdomen: Soft, gravid, nontender, AGA with Fundal Height: 28 cm  Pelvic: Cervical exam deferred           Extremities: Edema: None  Fetal Status: Fetal Heart Rate (bpm): 135  Movement: Present   No results found for this or any previous visit (from the past 24 hour(s)).  Assessment & Plan:  Low-risk pregnancy of a 25 y.o., G3P2002 at [redacted]w[redacted]d with an Estimated Date of Delivery: 01/08/22   1. Encounter for supervision of normal first  pregnancy in second trimester -Anticipatory guidance for upcoming appts. -Patient to schedule next appt in 2 weeks for an in-person visit. -Patient does not desire BTL; considering depo provera for PPBCM. -Aware of next [place] for Nov 23.    2. [redacted] weeks gestation of pregnancy -Doing well overall. -Reviewed Glucola appt occurring today. -Reviewed blood draw procedures and labs which also include check of iron/HgB level, RPR, and HIV *Informed that repeat RPR/HIV are for pediatric records/compliance.  -Discussed how results of GTT are handled including diabetic education and BS testing for abnormal results and routine care for normal results.  -Influenza to be given today.     Meds: No orders of the defined types were placed in this encounter.  Labs/procedures today: Lab Orders         Glucose Tolerance, 2 Hours w/1 Hour         RPR         CBC         HIV Antibody (routine testing w rflx)      Reviewed: Preterm labor symptoms and general obstetric precautions including but not limited to vaginal bleeding, contractions, leaking of fluid and fetal movement were reviewed in detail with the patient.  All questions were answered.  Follow-up: Return in about 2 weeks (around 10/30/2021).  Orders Placed This Encounter  Procedures   Glucose Tolerance, 2 Hours w/1 Hour   RPR   CBC   HIV Antibody (routine testing w rflx)   Cherre Robins MSN, CNM 10/16/2021

## 2021-10-17 LAB — HIV ANTIBODY (ROUTINE TESTING W REFLEX): HIV Screen 4th Generation wRfx: NONREACTIVE

## 2021-10-17 LAB — CBC
Hematocrit: 32.7 % — ABNORMAL LOW (ref 34.0–46.6)
Hemoglobin: 11.1 g/dL (ref 11.1–15.9)
MCH: 30.8 pg (ref 26.6–33.0)
MCHC: 33.9 g/dL (ref 31.5–35.7)
MCV: 91 fL (ref 79–97)
Platelets: 232 10*3/uL (ref 150–450)
RBC: 3.6 x10E6/uL — ABNORMAL LOW (ref 3.77–5.28)
RDW: 12 % (ref 11.7–15.4)
WBC: 9.8 10*3/uL (ref 3.4–10.8)

## 2021-10-17 LAB — RPR: RPR Ser Ql: NONREACTIVE

## 2021-10-17 LAB — GLUCOSE TOLERANCE, 2 HOURS W/ 1HR
Glucose, 1 hour: 84 mg/dL (ref 70–179)
Glucose, 2 hour: 92 mg/dL (ref 70–152)
Glucose, Fasting: 78 mg/dL (ref 70–91)

## 2021-10-22 ENCOUNTER — Ambulatory Visit: Payer: Medicaid Other | Admitting: *Deleted

## 2021-10-22 ENCOUNTER — Other Ambulatory Visit: Payer: Self-pay | Admitting: *Deleted

## 2021-10-22 ENCOUNTER — Ambulatory Visit: Payer: Medicaid Other | Attending: Obstetrics and Gynecology

## 2021-10-22 ENCOUNTER — Other Ambulatory Visit: Payer: Self-pay

## 2021-10-22 ENCOUNTER — Encounter: Payer: Self-pay | Admitting: *Deleted

## 2021-10-22 VITALS — BP 116/71 | HR 81

## 2021-10-22 DIAGNOSIS — O09293 Supervision of pregnancy with other poor reproductive or obstetric history, third trimester: Secondary | ICD-10-CM | POA: Insufficient documentation

## 2021-10-22 DIAGNOSIS — O99323 Drug use complicating pregnancy, third trimester: Secondary | ICD-10-CM

## 2021-10-22 DIAGNOSIS — O99013 Anemia complicating pregnancy, third trimester: Secondary | ICD-10-CM | POA: Diagnosis not present

## 2021-10-22 DIAGNOSIS — Z3480 Encounter for supervision of other normal pregnancy, unspecified trimester: Secondary | ICD-10-CM | POA: Diagnosis present

## 2021-10-22 DIAGNOSIS — O09299 Supervision of pregnancy with other poor reproductive or obstetric history, unspecified trimester: Secondary | ICD-10-CM

## 2021-10-22 DIAGNOSIS — Z362 Encounter for other antenatal screening follow-up: Secondary | ICD-10-CM | POA: Diagnosis not present

## 2021-10-22 DIAGNOSIS — Z3A28 28 weeks gestation of pregnancy: Secondary | ICD-10-CM | POA: Diagnosis not present

## 2021-10-25 IMAGING — US US MFM OB FOLLOW-UP
1 series · 13 of 28 positions shown · non-contrast
Comparison: none

[Series 1: us mfm ob follow-up · 44 acquisitions, 13 frames shown]
[im 2/44]
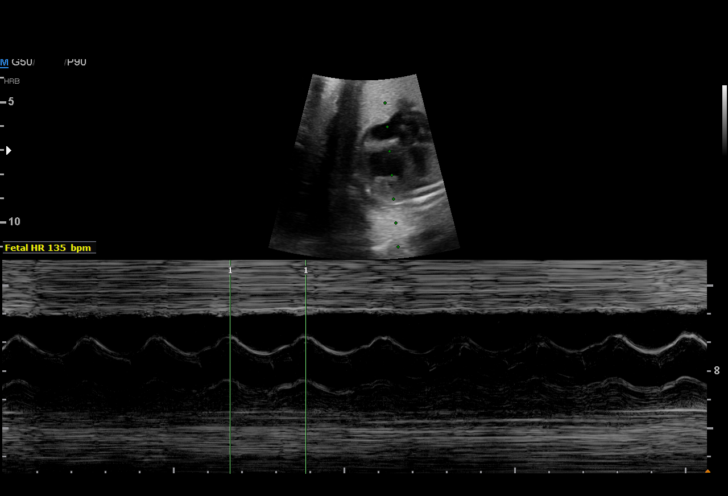
[im 5/44]
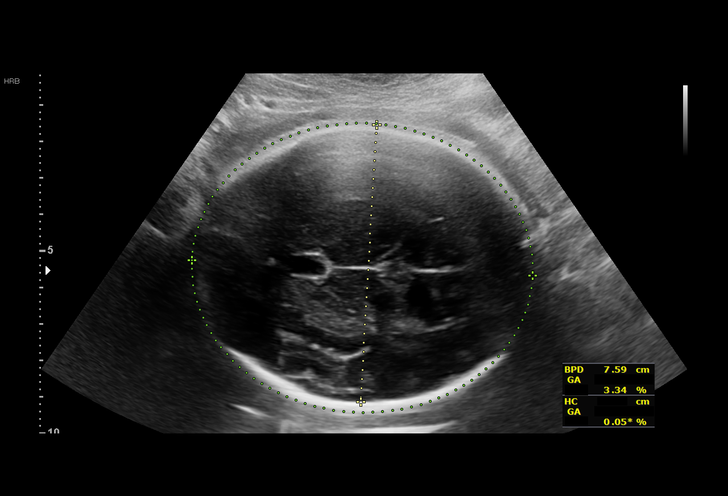
[im 8/44]
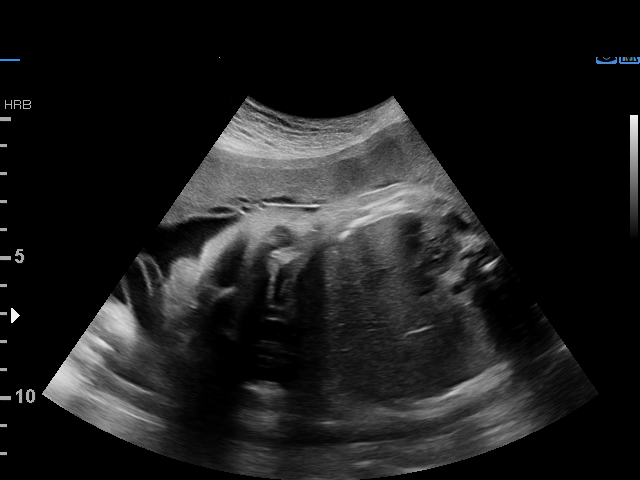
[im 12/44]
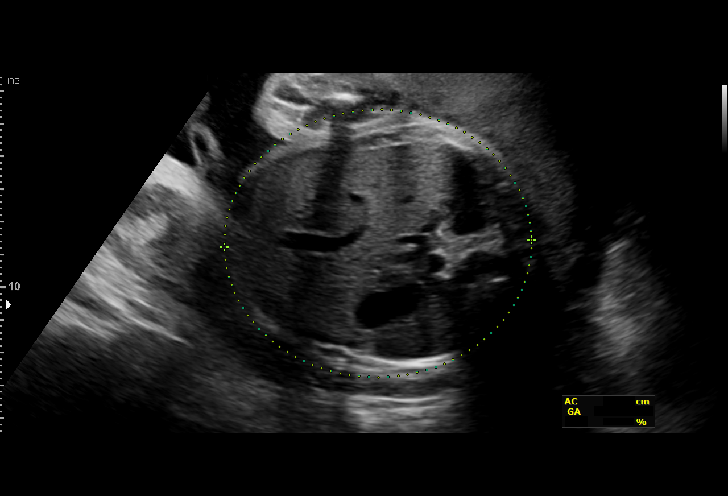
[im 15/44]
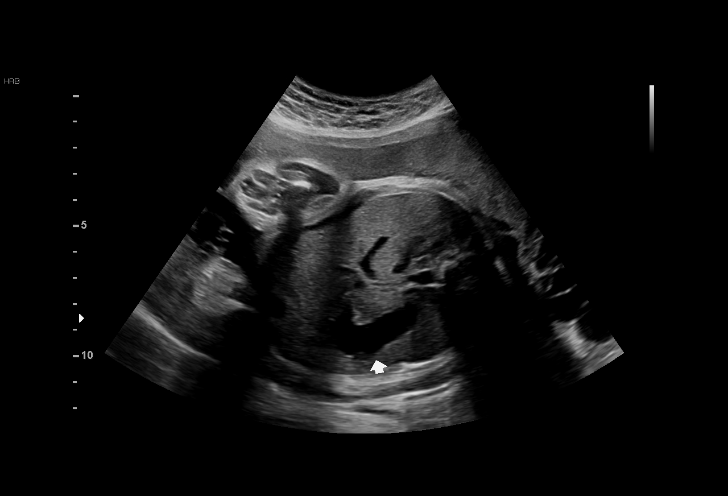
[im 18/44]
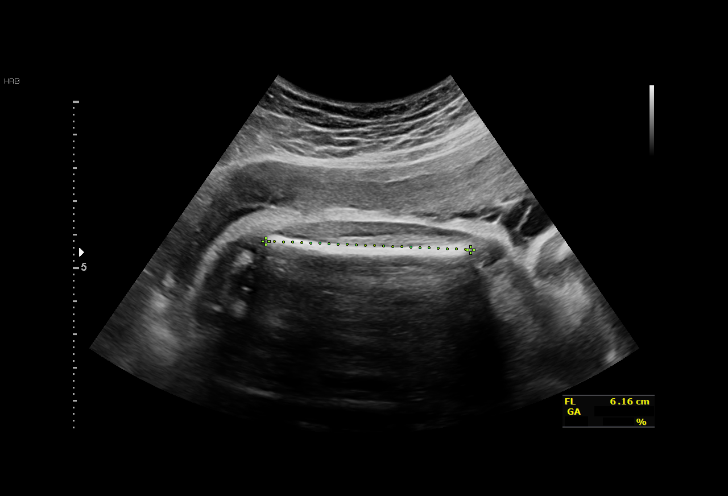
[im 23/44]
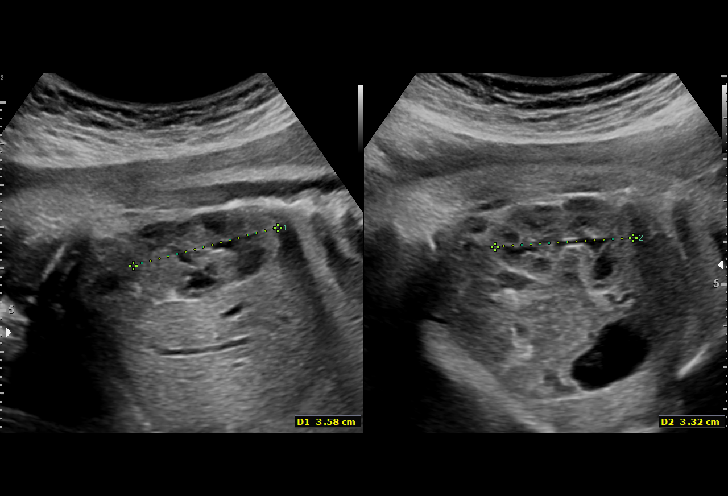
[im 26/44]
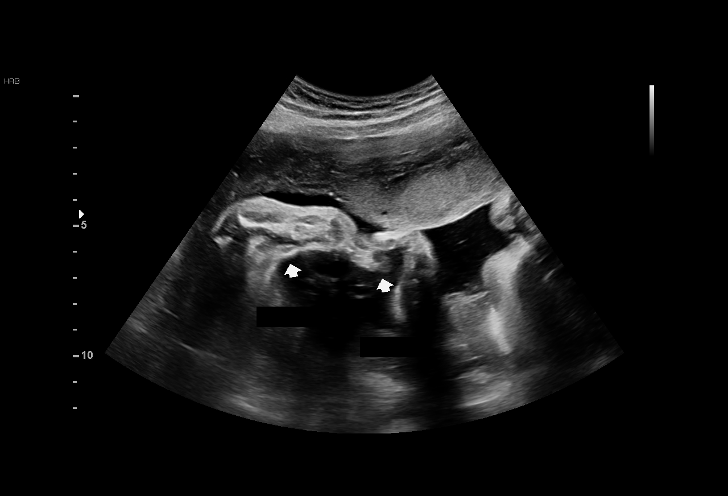
[im 29/44]
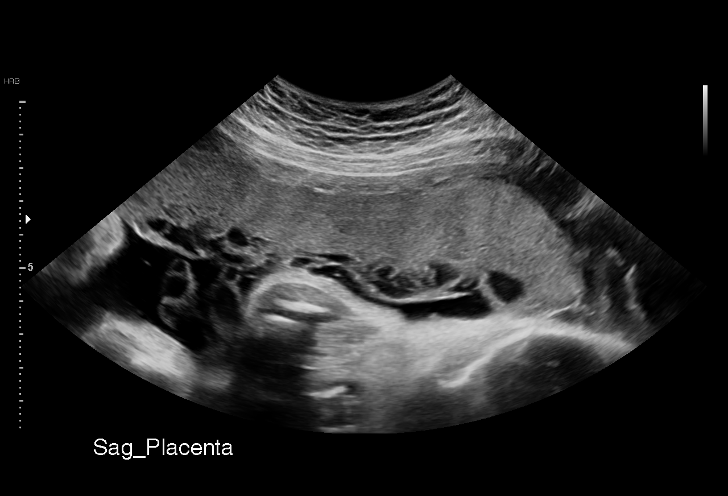
[im 32/44]
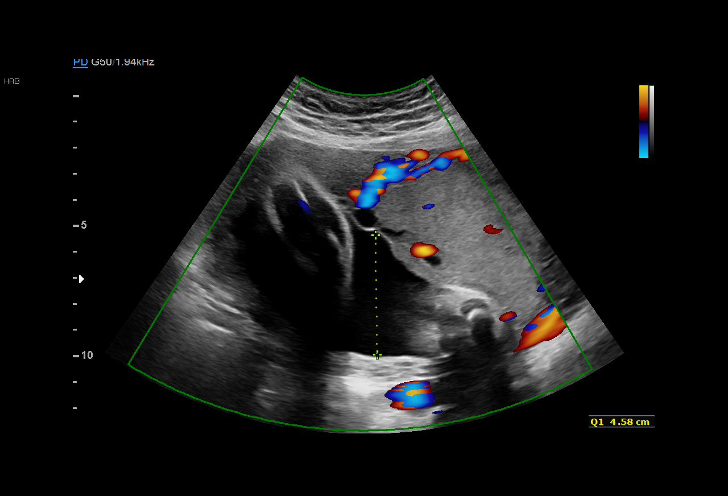
[im 36/44]
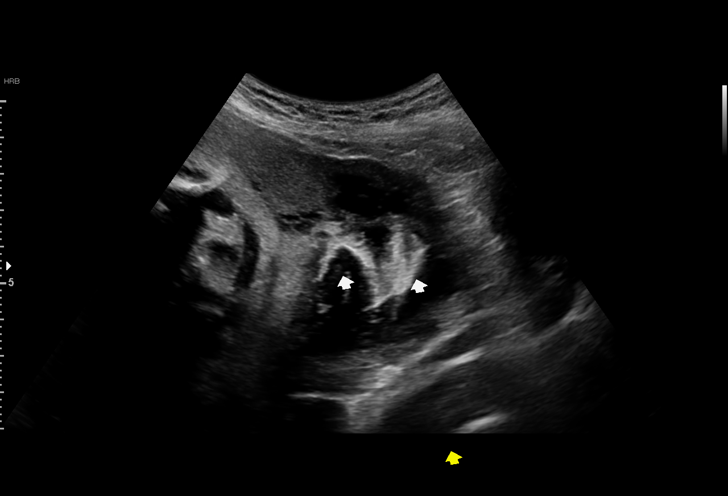
[im 39/44]
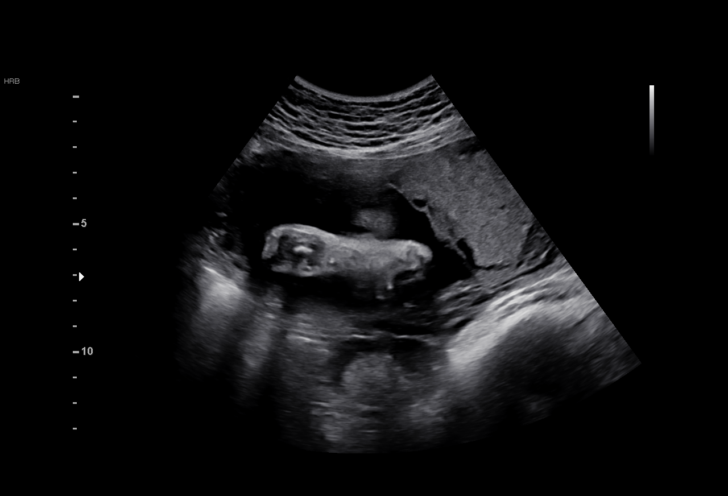
[im 42/44]
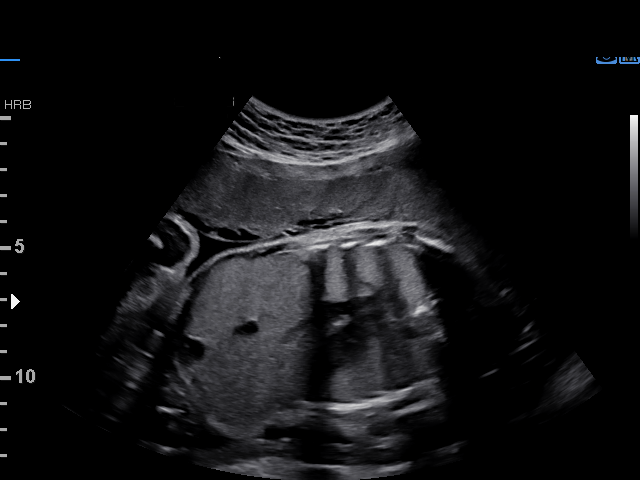

[13 of 28 positions shown; findings below may reference images not displayed]

DOHOON
 ----------------------------------------------------------------------

 ----------------------------------------------------------------------
Indications

  Poor obstetric history: Previous fetal growth
  restriction (FGR)
  Encounter for other antenatal screening
  follow-up (low risk NIPS)
  Alcohol use complicating pregnancy, first
  trimester
  32 weeks gestation of pregnancy
 ----------------------------------------------------------------------
Vital Signs

 BMI:
Fetal Evaluation

 Num Of Fetuses:         1
 Fetal Heart Rate(bpm):  135
 Cardiac Activity:       Observed
 Presentation:           Cephalic
 Placenta:               Anterior
 P. Cord Insertion:      Previously Visualized

 Amniotic Fluid
 AFI FV:      Subjectively decreased

 AFI Sum(cm)     %Tile       Largest Pocket(cm)
 8.36            5

 RUQ(cm)       RLQ(cm)       LUQ(cm)        LLQ(cm)
 4.58          3.78          0              0
Biometry

 BPD:        76  mm     G. Age:  30w 3d          4  %    CI:        78.41   %    70 - 86
                                                         FL/HC:      22.7   %    19.1 -
 HC:      271.5  mm     G. Age:  29w 4d        < 1  %    HC/AC:      0.99        0.96 -
 AC:      274.7  mm     G. Age:  31w 4d         24  %    FL/BPD:     80.9   %    71 - 87
 FL:       61.5  mm     G. Age:  31w 6d         24  %    FL/AC:      22.4   %    20 - 24

 Est. FW:    1704  gm    3 lb 14 oz      13  %
OB History

 Gravidity:    2         Term:   1        Prem:   0        SAB:   0
 TOP:          0       Ectopic:  0        Living: 1
Gestational Age

 LMP:           32w 2d        Date:  04/30/19                 EDD:   02/04/20
 U/S Today:     30w 6d                                        EDD:   02/14/20
 Best:          32w 3d     Det. By:  Early Ultrasound         EDD:   02/03/20
                                     (07/13/19)
Anatomy

 Cranium:               Appears normal         LVOT:                   Previously seen
 Cavum:                 Appears normal         Aortic Arch:            Previously seen
 Ventricles:            Appears normal         Ductal Arch:            Previously seen
 Choroid Plexus:        Previously seen        Diaphragm:              Appears normal
 Cerebellum:            Previously seen        Stomach:                Appears normal, left
                                                                       sided
 Posterior Fossa:       Previously seen        Abdomen:                Appears normal
 Nuchal Fold:           Previously seen        Abdominal Wall:         Previously seen
 Face:                  Orbits and profile     Cord Vessels:           Previously seen
                        previously seen
 Lips:                  Previously seen        Kidneys:                Appear normal
 Palate:                Previously seen        Bladder:                Appears normal
 Thoracic:              Appears normal         Spine:                  Previously seen
                        Appears normal
 Heart:                 Previously seen        Upper Extremities:      Previously seen
 RVOT:                  Previously seen        Lower Extremities:      Previously seen

 Other:  Fetus appears to be a male prev seen. Heels and 5th digit prev
         visualized. Technically difficult due to fetal position.
Cervix Uterus Adnexa

 Cervix
 Not visualized (advanced GA >84wks)
Comments

 This patient was seen for a follow up growth scan due to a
 prior pregnancy that was complicated by a growth restricted
 fetus.  She denies any problems since her last exam.
 She was informed that the fetal growth measures in the lower
 normal range today.  The amniotic fluid level was also in the
 lower normal range today.
 Due to her history and today's ultrasound findings, a follow-up
 exam was scheduled in 3 weeks.  Should fetal growth
 restriction be noted at her next exam, we will continue to
 follow her with weekly fetal testing and weekly umbilical artery
 Doppler studies.

## 2021-10-30 ENCOUNTER — Telehealth (INDEPENDENT_AMBULATORY_CARE_PROVIDER_SITE_OTHER): Payer: Medicaid Other | Admitting: Obstetrics

## 2021-10-30 ENCOUNTER — Encounter: Payer: Self-pay | Admitting: Obstetrics

## 2021-10-30 DIAGNOSIS — Z3480 Encounter for supervision of other normal pregnancy, unspecified trimester: Secondary | ICD-10-CM

## 2021-10-30 DIAGNOSIS — Z3483 Encounter for supervision of other normal pregnancy, third trimester: Secondary | ICD-10-CM

## 2021-10-30 DIAGNOSIS — Z3A30 30 weeks gestation of pregnancy: Secondary | ICD-10-CM

## 2021-10-30 NOTE — Progress Notes (Signed)
OBSTETRICS PRENATAL VIRTUAL VISIT ENCOUNTER NOTE  [facility]   Patient location: Home  I connected with Catherine Collins on 10/30/21 at  1:50 PM EST by MyChart Video Encounter and verified that I am speaking with the correct person using two identifiers. I discussed the limitations, risks, security and privacy concerns of performing an evaluation and management service virtually and the availability of in person appointments. I also discussed with the patient that there may be a patient responsible charge related to this service. The patient expressed understanding and agreed to proceed. Subjective:  Catherine Collins is a 25 y.o. G3P2002 at [redacted]w[redacted]d being seen today for ongoing prenatal care.  She is currently monitored for the following issues for this low-risk pregnancy and has History of depression; History of prior pregnancy with IUGR newborn; History of tobacco use; Marijuana use; Supervision of other normal pregnancy, antepartum; and Chlamydia infection affecting pregnancy on their problem list.  Patient reports occasional contractions and possible lost of mucous plug .  Contractions: Irritability. Vag. Bleeding: None.  Movement: Present. Denies any leaking of fluid.   The following portions of the patient's history were reviewed and updated as appropriate: allergies, current medications, past family history, past medical history, past social history, past surgical history and problem list.   Objective:  There were no vitals filed for this visit.  Fetal Status:     Movement: Present     General:  Alert, oriented and cooperative. Patient is in no acute distress.  Respiratory: Normal respiratory effort, no problems with respiration noted  Mental Status: Normal mood and affect. Normal behavior. Normal judgment and thought content.  Rest of physical exam deferred due to type of encounter  Imaging: [place] MFM OB FOLLOW UP  Result Date:  10/22/2021 ----------------------------------------------------------------------  OBSTETRICS REPORT                       (Signed Final 10/22/2021 03:21 pm) ---------------------------------------------------------------------- Patient Info  ID #:       [id number]                          D.O.B.:  October 03, 1996 (25 yrs)  Name:       Catherine Collins                Visit Date: 10/22/2021 12:53 pm ---------------------------------------------------------------------- Performed By  Attending:        Noralee Space MD        Ref. Address:     [address]                                                             [phone]  [place]                                                             [address]  Performed By:     Tommie Raymond BS,       Secondary Phy.:   NICOLE E                    RDMS, RVT                                                             NUGENT  Referred By:      [facility]             [facility] ---------------------------------------------------------------------- Orders  #  Description                           Code        Ordered By  1  [place] MFM OB FOLLOW UP                   40102.72    RAVI [facility] ----------------------------------------------------------------------  #  Order #                     Accession #                Episode #  1  [id number]                   [id number]                 [id number] ---------------------------------------------------------------------- Indications  Poor obstetric history: Previous fetal growth  O09.299  restriction (FGR)  [redacted] weeks gestation of pregnancy                Z3A.28  Encounter for other  antenatal screening        Z36.2  follow-up  Drug use complicating pregnancy, 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 Cord Insertion:      Previously Visualized  Amniotic Fluid  AFI FV:      Within normal limits  AFI Sum(cm)     %Tile       Largest Pocket(cm)  15.2            54          4.7  RUQ(cm)       RLQ(cm)       LUQ(cm)        LLQ(cm)  3.6           2.3           4.5            4.7 ---------------------------------------------------------------------- Biometry  BPD:      74.1  mm     G. Age:  29w 5d         66  %    CI:        76.26   %    70 - 86                                                          FL/HC:      19.8   %    19.6 - 20.8  HC:      268.9  mm     G. Age:  29w 2d         30  %    HC/AC:      1.07        0.99 - 1.21  AC:      250.4  mm     G. Age:  29w 2d         56  %    FL/BPD:     71.8   %    71 - 87  FL:       53.2  mm     G. Age:  28w 2d         19  %    FL/AC:      21.2   %    20 - 24  CER:      35.7  mm     G. Age:  29w 6d         81  %  LV:        1.9  mm  CM:        9.5  mm  Est. FW:    1312  gm    2 lb 14  oz      40  % ---------------------------------------------------------------------- OB History  Blood Type:   B+  Gravidity:    3         Term:   2        Prem:   0        SAB:   0  TOP:          0       Ectopic:  0        Living: 2 ---------------------------------------------------------------------- Gestational Age  LMP:           31w 6d        Date:  03/13/21                 EDD:   12/18/21  U/S Today:     29w 1d                                         EDD:   01/06/22  Best:          28w 6d     Det. By:  U/S C R L  (06/11/21)    EDD:   01/08/22 ---------------------------------------------------------------------- Anatomy  Cranium:               Appears normal         LVOT:                   Appears normal  Cavum:                 Appears normal         Aortic Arch:            Previously seen  Ventricles:            Appears normal         Ductal Arch:            Previously seen  Choroid Plexus:        Appears normal         Diaphragm:              Appears normal  Cerebellum:            Appears normal         Stomach:                Appears normal, left                                                                        sided  Posterior Fossa:       Appears normal         Abdomen:                Previously seen  Nuchal Fold:           Previously seen        Abdominal Wall:         Previously seen  Face:                  Orbits and profile     Cord Vessels:  Previously seen                         previously seen  Lips:                  Previously seen        Kidneys:                Appear normal  Palate:                Previously seen        Bladder:                Appears normal  Thoracic:              Appears normal         Spine:                  Previously seen  Heart:                 Appears normal         Upper Extremities:      Previously seen                         (4CH, axis, and                         situs)  RVOT:                  Appears normal         Lower Extremities:      Previously seen  Other:  Female gender previously seen. Open Hands, Nasal Bone, Lenses,          Heels/Feet, VC, 3VV and 3VTV previously visualized. Technically          difficult due to fetal position. ---------------------------------------------------------------------- Cervix Uterus Adnexa  Cervix  Length:           4.16  cm.  Normal appearance by transabdominal scan.  Uterus  No abnormality visualized.  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  Left Ovary  Size(cm)       2.2  x   1.6    x  3.1       Vol(ml): 5.71  Within normal limits.  Cul De Sac  No free fluid seen.  Adnexa  No adnexal mass visualized. ---------------------------------------------------------------------- Impression  History of fetal growth restriction.  Patient does not have  gestational diabetes.  Blood pressure today at her office is  116/71 mmHg.  Fetal growth is appropriate for gestational age .Amniotic fluid  is normal and good fetal activity is seen .  Right ovarian  dermoid cyst is seen again, which is essentially unchanged.  Patient does not have abdominal symptoms. ---------------------------------------------------------------------- Recommendations  -An appointment was made for her to return in 4 weeks for  fetal growth assessment. ----------------------------------------------------------------------                  Noralee Space, MD Electronically Signed Final Report   10/22/2021 03:21 pm ----------------------------------------------------------------------   Assessment and Plan:  Pregnancy: [id number] at [redacted]w[redacted]d 1. Supervision of other normal pregnancy, antepartum - possible preterm labor - patient instructed to come  to the office to be checked this afternoon  Preterm labor symptoms and general obstetric precautions including but not limited to vaginal bleeding, contractions, leaking of fluid and fetal movement were reviewed in detail with the patient. I discussed the assessment and treatment plan with the patient. The patient was provided an opportunity to ask questions and all were answered. The patient agreed with the plan and demonstrated an understanding of the instructions. The patient was advised to call back or seek an in-person office evaluation/go to [facility] at [facility] for any urgent or concerning symptoms. Please refer to After Visit Summary for other counseling  recommendations.   I have spent a tota of 15 minutes of non-face-to-face time, excluding clinical staff time, reviewing notes and preparing to see patient, ordering tests and/or medications, and counseling the patient.   No follow-ups on file.  Future Appointments  Date Time Provider Department Center  11/19/2021 11:00 AM [facility] NURSE [facility]  11/19/2021 11:15 AM [facility]    Coral Ceo, MD Center for [facility], [facility]  10/30/21

## 2021-10-30 NOTE — Progress Notes (Signed)
I connected with  Catherine Collins on 10/30/21 by a video enabled telemedicine application and verified that I am speaking with the correct person using two identifiers.   I discussed the limitations of evaluation and management by telemedicine. The patient expressed understanding and agreed to proceed.   Mychart OB, c/o losing her mucus plug.

## 2021-11-04 ENCOUNTER — Encounter: Payer: Self-pay | Admitting: Obstetrics

## 2021-11-10 ENCOUNTER — Encounter: Payer: Self-pay | Admitting: Obstetrics

## 2021-11-14 ENCOUNTER — Telehealth (INDEPENDENT_AMBULATORY_CARE_PROVIDER_SITE_OTHER): Payer: Medicaid Other | Admitting: Obstetrics

## 2021-11-14 ENCOUNTER — Encounter: Payer: Self-pay | Admitting: Obstetrics

## 2021-11-14 VITALS — BP 115/61 | HR 90

## 2021-11-14 DIAGNOSIS — Z3483 Encounter for supervision of other normal pregnancy, third trimester: Secondary | ICD-10-CM

## 2021-11-14 DIAGNOSIS — Z3A32 32 weeks gestation of pregnancy: Secondary | ICD-10-CM

## 2021-11-14 DIAGNOSIS — Z3480 Encounter for supervision of other normal pregnancy, unspecified trimester: Secondary | ICD-10-CM

## 2021-11-14 DIAGNOSIS — R42 Dizziness and giddiness: Secondary | ICD-10-CM

## 2021-11-14 NOTE — Progress Notes (Addendum)
I connected with  [name] on 11/14/21 by a video enabled telemedicine application and verified that I am speaking with the correct person using two identifiers.   I discussed the limitations of evaluation and management by telemedicine. The patient expressed understanding and agreed to proceed.   Mychart OB, c/o light headedness  Brock Bad, MD 11/14/2021

## 2021-11-14 NOTE — Progress Notes (Addendum)
Subjective:  Catherine Collins is a 25 y.o. G3P2002 at [redacted]w[redacted]d being seen today for ongoing prenatal care.  She is currently monitored for the following issues for this low-risk pregnancy and has History of depression; History of prior pregnancy with IUGR newborn; History of tobacco use; Marijuana use; Supervision of other normal pregnancy, antepartum; and Chlamydia infection affecting pregnancy on their problem list.  Patient reports  lightheadedness when standing .  Contractions: Not present. Vag. Bleeding: None.  Movement: Present. Denies leaking of fluid.   The following portions of the patient's history were reviewed and updated as appropriate: allergies, current medications, past family history, past medical history, past social history, past surgical history and problem list. Problem list updated.  Objective:   Vitals:   11/14/21 1011  BP: 115/61  Pulse: 90    Fetal Status:     Movement: Present     General:  Alert, oriented and cooperative. Patient is in no acute distress.  Skin: Skin is warm and dry. No rash noted.   Cardiovascular: Normal heart rate noted  Respiratory: Normal respiratory effort, no problems with respiration noted  Abdomen: Soft, gravid, appropriate for gestational age. Pain/Pressure: Absent     Pelvic:  Cervical exam deferred        Extremities: Normal range of motion.  Edema: Trace  Mental Status: Normal mood and affect. Normal behavior. Normal judgment and thought content.   Urinalysis:      Assessment and Plan:  Pregnancy: G3P2002 at [redacted]w[redacted]d  1. Supervision of other normal pregnancy, antepartum  2. Postural lightheadedness - discussed pregnancy postural changes.  Educational instructions given.   Preterm labor symptoms and general obstetric precautions including but not limited to vaginal bleeding, contractions, leaking of fluid and fetal movement were reviewed in detail with the patient. Please refer to After Visit Summary for other counseling  recommendations.   I have spent a total of 15 minutes of face-to-face time, excluding clinical staff time, reviewing notes and preparing to see patient, ordering tests and/or medications, and counseling the patient.   Return in about 2 weeks (around 11/28/2021) for ROB.   Brock Bad, MD  11/14/21

## 2021-11-19 ENCOUNTER — Ambulatory Visit: Payer: Medicaid Other | Attending: Obstetrics and Gynecology

## 2021-11-19 ENCOUNTER — Other Ambulatory Visit: Payer: Self-pay | Admitting: *Deleted

## 2021-11-19 ENCOUNTER — Ambulatory Visit: Payer: Medicaid Other | Admitting: *Deleted

## 2021-11-19 ENCOUNTER — Other Ambulatory Visit: Payer: Self-pay

## 2021-11-19 VITALS — BP 116/64 | HR 52

## 2021-11-19 DIAGNOSIS — Z3480 Encounter for supervision of other normal pregnancy, unspecified trimester: Secondary | ICD-10-CM

## 2021-11-19 DIAGNOSIS — O99323 Drug use complicating pregnancy, third trimester: Secondary | ICD-10-CM | POA: Diagnosis present

## 2021-11-19 DIAGNOSIS — D649 Anemia, unspecified: Secondary | ICD-10-CM

## 2021-11-19 DIAGNOSIS — Z3689 Encounter for other specified antenatal screening: Secondary | ICD-10-CM

## 2021-11-19 DIAGNOSIS — O99013 Anemia complicating pregnancy, third trimester: Secondary | ICD-10-CM | POA: Diagnosis not present

## 2021-11-19 DIAGNOSIS — O09293 Supervision of pregnancy with other poor reproductive or obstetric history, third trimester: Secondary | ICD-10-CM | POA: Diagnosis not present

## 2021-11-19 DIAGNOSIS — Z3A32 32 weeks gestation of pregnancy: Secondary | ICD-10-CM | POA: Diagnosis not present

## 2021-11-25 ENCOUNTER — Other Ambulatory Visit: Payer: Self-pay | Admitting: Obstetrics

## 2021-11-25 DIAGNOSIS — B3731 Acute candidiasis of vulva and vagina: Secondary | ICD-10-CM

## 2021-11-30 NOTE — L&D Delivery Note (Signed)
OB/GYN Faculty Practice Delivery Note  Catherine Collins is a 26 y.o. G3P2002 s/p SVD at [redacted]w[redacted]d. She was admitted for PPROM.   ROM: 19h 55m with clear fluid GBS Status: positive Maximum Maternal Temperature: 98.7  Labor Progress: Presented with PPROM, was contracting on own and then when spaced out was augmented with pitocin   Delivery Date/Time: [id number] on 12/07/2021 Delivery: Called to room and patient was complete and pushing. Head delivered LOA. No nuchal cord present. Shoulder and body delivered in usual fashion. Infant with small spontaneous cry, placed on mother's abdomen, dried and stimulated. Cord clamped x 2 after 1-minute delay, and cut by father of baby. Baby was then handed over to awaiting NICU team. Cord blood drawn. Placenta delivered spontaneously with gentle cord traction. Fundus firm with massage and Pitocin. Labia, perineum, vagina, and cervix inspected and found to be intact.   Placenta: intact, clot on right side concerning for possible abruption, 3 V cord, to path Complications: None Lacerations: NOne EBL: 100 Analgesia: epidural   Infant: female   APGARs 8 at 1 min, 5 min APGAR pending per NICU   weight pending  Renard Matter, MD, MPH OB Fellow, [place] for [facility], [facility] 12/07/2021, 7:49 AM

## 2021-12-05 ENCOUNTER — Other Ambulatory Visit: Payer: Self-pay

## 2021-12-05 ENCOUNTER — Encounter (HOSPITAL_COMMUNITY): Payer: Self-pay | Admitting: Family Medicine

## 2021-12-05 ENCOUNTER — Inpatient Hospital Stay (EMERGENCY_DEPARTMENT_HOSPITAL)
Admission: AD | Admit: 2021-12-05 | Discharge: 2021-12-05 | Disposition: A | Discharge status: Home / Self Care | Payer: Medicaid Other | Source: Home / Self Care | Attending: Family Medicine | Admitting: Family Medicine

## 2021-12-05 DIAGNOSIS — Z3480 Encounter for supervision of other normal pregnancy, unspecified trimester: Secondary | ICD-10-CM

## 2021-12-05 DIAGNOSIS — Z87891 Personal history of nicotine dependence: Secondary | ICD-10-CM | POA: Insufficient documentation

## 2021-12-05 DIAGNOSIS — O26853 Spotting complicating pregnancy, third trimester: Secondary | ICD-10-CM | POA: Insufficient documentation

## 2021-12-05 DIAGNOSIS — M549 Dorsalgia, unspecified: Secondary | ICD-10-CM | POA: Insufficient documentation

## 2021-12-05 DIAGNOSIS — O479 False labor, unspecified: Secondary | ICD-10-CM

## 2021-12-05 DIAGNOSIS — O4703 False labor before 37 completed weeks of gestation, third trimester: Secondary | ICD-10-CM | POA: Insufficient documentation

## 2021-12-05 DIAGNOSIS — R109 Unspecified abdominal pain: Secondary | ICD-10-CM | POA: Insufficient documentation

## 2021-12-05 DIAGNOSIS — O26893 Other specified pregnancy related conditions, third trimester: Secondary | ICD-10-CM | POA: Insufficient documentation

## 2021-12-05 DIAGNOSIS — Z3A35 35 weeks gestation of pregnancy: Secondary | ICD-10-CM

## 2021-12-05 LAB — URINALYSIS, MICROSCOPIC (REFLEX)

## 2021-12-05 LAB — URINALYSIS, ROUTINE W REFLEX MICROSCOPIC
Bilirubin Urine: NEGATIVE
Glucose, UA: NEGATIVE mg/dL
Ketones, ur: NEGATIVE mg/dL
Nitrite: NEGATIVE
Protein, ur: NEGATIVE mg/dL
Specific Gravity, Urine: 1.02 (ref 1.005–1.030)
pH: 7.5 (ref 5.0–8.0)

## 2021-12-05 NOTE — Discharge Instructions (Signed)
Return if contractions are worsening or if having bleeding or leaking of fluid. Expect the office to call to schedule an appointment for Thursday and then weekly. Drink twice as much fluids by mouth as you have been drinking. Continue to treat your vaginal yeast infection.

## 2021-12-05 NOTE — MAU Note (Addendum)
Presents with c/o lower abdominal and back pain that approximately 1500 this afternoon.  Reports took Tylenol @ 1530, no relief.  States abdominal pain is now relieved. Also reports began losing mucus plug last night and now spotting brown colored vaginal discharge. Denies recent intercourse, none since last week. Denies LOF or VB.  Endorses +FM.

## 2021-12-05 NOTE — MAU Provider Note (Signed)
History     CSN: [id number]  Arrival date and time: 12/05/21 1805   Event Date/Time   First Provider Initiated Contact with Patient 12/05/21 1907      Chief Complaint  Patient presents with   Abdominal Pain   Back Pain   Spotting   HPI Catherine Collins 26 y.o. 31w1dComes to MAU as this afternoon she noticed brown blood in her underwear and lost her mucus plug.  Is having low back pain and some pelvic pressure.  Has not had red bleeding, Has not had leaking of fluid.   OB History     Gravida  3   Para  2   Term  2   Preterm      AB      Living  2      SAB      IAB      Ectopic      Multiple  0   Live Births  2           Past Medical History:  Diagnosis Date   Anemia    Anxiety    Depression    Mental disorder    Supervision of other normal pregnancy, antepartum 07/13/2019    Nursing Staff Provider Office Location  [facility] Dating  Uncertain LWUJ/81Language   ENGLISH Anatomy [place] Normal Flu Vaccine   Genetic Screen  NIPS: Low risk Female  AFP:    TDaP vaccine    Hgb A1C or  GTT Early  Third trimester  Rhogam  NA   LAB RESULTS  Feeding Plan  both Blood Type B/Positive/-- (08/13 1437)  Contraception  Condoms Antibody Negative (08/13 1437) Circumcision  yes if boy [place]   Past Surgical History:  Procedure Laterality Date   WISDOM TOOTH EXTRACTION      Family History  Problem Relation Age of Onset   Drug abuse Maternal Grandfather    Diabetes Maternal Grandfather    Hypertension Maternal Grandfather    Vision loss Maternal Grandfather    Cancer Maternal Grandmother    COPD Maternal Grandmother     Social History   Tobacco Use   Smoking status: Former    Packs/day: 0.50    Types: Cigarettes    Quit date: 02/11/2018    Years since quitting: 3.8   Smokeless tobacco: Never   Tobacco comments:    stopped smoking after preg confirm  Vaping Use   Vaping Use: Never used  Substance Use Topics   Alcohol use: Not Currently    Alcohol/week:  0.0 standard drinks   Drug use: No    Allergies:  Allergies  Allergen Reactions   Betamethasone Rash    Medications Prior to Admission  Medication Sig Dispense Refill Last Dose   Blood Pressure Monitor KIT 1 kit by Does not apply route once a week. Check BP weekly. Large Cuff DX: Z34.6 1 kit 0    Prenat-Fe Poly-Methfol-FA-DHA (VITAFOL ULTRA) 29-0.6-0.4-200 MG CAPS Take 1 capsule by mouth daily before breakfast. 90 capsule 4    terconazole (TERAZOL 7) 0.4 % vaginal cream INSERT 1 APPLICATORFUL VAGINALLY  AT BEDTIME 45 g 0     Review of Systems  Constitutional:  Negative for fever.  Respiratory:  Negative for cough.   Gastrointestinal:        Some lower abdominal pressure  Genitourinary:  Positive for vaginal discharge.       Spotting - brown discharge in underwear Lost mucus plug  Musculoskeletal:  Positive  for back pain.  Physical Exam   Blood pressure 115/71, pulse 93, temperature 98.3 F (36.8 C), temperature source Oral, resp. rate 19, height '5\' 2"'  (1.575 m), weight 65.9 kg, last menstrual period 03/13/2021, SpO2 97 %, unknown if currently breastfeeding.  Physical Exam Vitals and nursing note reviewed.  Constitutional:      Appearance: She is well-developed.  HENT:     Head: Normocephalic.  Cardiovascular:     Rate and Rhythm: Regular rhythm.  Pulmonary:     Effort: Pulmonary effort is normal.  Abdominal:     Palpations: Abdomen is soft.     Tenderness: There is no abdominal tenderness. There is no guarding or rebound.  Genitourinary:    Comments: Cervical exam 1 cm, 40%, vertex, -3, no blood see on glove Musculoskeletal:        General: Normal range of motion.     Cervical back: Neck supple.  Skin:    General: Skin is warm and dry.  Neurological:     Mental Status: She is alert and oriented to person, place, and time.  Psychiatric:        Mood and Affect: Mood normal.        Behavior: Behavior normal.        Thought Content: Thought content normal.   FHT  Baseline 140 with moderate variability Occasional contraction No decelerations 15x15 accels noted Category 1 tracing  MAU Course  Procedures LABS Results for orders placed or performed during the hospital encounter of 12/05/21 (from the past 24 hour(s))  Urinalysis, Routine w reflex microscopic Urine, Clean Catch     Status: Abnormal   Collection Time: 12/05/21  6:23 PM  Result Value Ref Range   Color, Urine YELLOW YELLOW   APPearance CLEAR CLEAR   Specific Gravity, Urine 1.020 1.005 - 1.030   pH 7.5 5.0 - 8.0   Glucose, UA NEGATIVE NEGATIVE mg/dL   Hgb urine dipstick TRACE (A) NEGATIVE   Bilirubin Urine NEGATIVE NEGATIVE   Ketones, ur NEGATIVE NEGATIVE mg/dL   Protein, ur NEGATIVE NEGATIVE mg/dL   Nitrite NEGATIVE NEGATIVE   Leukocytes,Ua SMALL (A) NEGATIVE  Urinalysis, Microscopic (reflex)     Status: Abnormal   Collection Time: 12/05/21  6:23 PM  Result Value Ref Range   RBC / HPF 0-5 0 - 5 RBC/hpf   WBC, UA 6-10 0 - 5 WBC/hpf   Bacteria, UA RARE (A) NONE SEEN   Squamous Epithelial / LPF 6-10 0 - 5   Mucus PRESENT    Budding Yeast PRESENT    Hyaline Casts, UA PRESENT      MDM Reviewed labs.  Needs more fluid intake. Also treated with vaginal yeast cream x one night only.  Advised to continue to treat for 6 more days.  Yeast may be causing the brown discharge. Reviewed contractions and to return with any worsening of contractions or worsening of bleeding.  Assessment and Plan  False labor Category 1 tracing  Plan Sent message to office for patient to be scheduled for weekly viisit beginning Thursday.  Was seen for a video visit and then missed being scheduled appropriately. Return to MAU if pain or bleeding is worsening. Drink twice as many fluids as she was drinking today for better hydration.  Virginia Rochester 12/05/2021, 7:36 PM

## 2021-12-06 ENCOUNTER — Inpatient Hospital Stay (HOSPITAL_COMMUNITY): Payer: Medicaid Other | Admitting: Anesthesiology

## 2021-12-06 ENCOUNTER — Encounter (HOSPITAL_COMMUNITY): Payer: Self-pay | Admitting: Obstetrics and Gynecology

## 2021-12-06 ENCOUNTER — Inpatient Hospital Stay (HOSPITAL_COMMUNITY)
Admission: AD | Admit: 2021-12-06 | Discharge: 2021-12-09 | DRG: 805 | Disposition: A | Discharge status: Home / Self Care | Payer: Medicaid Other | Attending: Obstetrics and Gynecology | Admitting: Obstetrics and Gynecology

## 2021-12-06 DIAGNOSIS — O99820 Streptococcus B carrier state complicating pregnancy: Secondary | ICD-10-CM | POA: Diagnosis not present

## 2021-12-06 DIAGNOSIS — Z3A35 35 weeks gestation of pregnancy: Secondary | ICD-10-CM | POA: Diagnosis not present

## 2021-12-06 DIAGNOSIS — Z3480 Encounter for supervision of other normal pregnancy, unspecified trimester: Secondary | ICD-10-CM

## 2021-12-06 DIAGNOSIS — Z87891 Personal history of nicotine dependence: Secondary | ICD-10-CM

## 2021-12-06 DIAGNOSIS — O42913 Preterm premature rupture of membranes, unspecified as to length of time between rupture and onset of labor, third trimester: Secondary | ICD-10-CM | POA: Diagnosis present

## 2021-12-06 DIAGNOSIS — O479 False labor, unspecified: Secondary | ICD-10-CM | POA: Diagnosis not present

## 2021-12-06 DIAGNOSIS — O42919 Preterm premature rupture of membranes, unspecified as to length of time between rupture and onset of labor, unspecified trimester: Secondary | ICD-10-CM | POA: Diagnosis present

## 2021-12-06 DIAGNOSIS — Z20822 Contact with and (suspected) exposure to covid-19: Secondary | ICD-10-CM | POA: Diagnosis present

## 2021-12-06 DIAGNOSIS — O42013 Preterm premature rupture of membranes, onset of labor within 24 hours of rupture, third trimester: Secondary | ICD-10-CM | POA: Diagnosis not present

## 2021-12-06 DIAGNOSIS — A749 Chlamydial infection, unspecified: Secondary | ICD-10-CM | POA: Diagnosis present

## 2021-12-06 DIAGNOSIS — Z3490 Encounter for supervision of normal pregnancy, unspecified, unspecified trimester: Secondary | ICD-10-CM

## 2021-12-06 LAB — CBC
HCT: 31.5 % — ABNORMAL LOW (ref 36.0–46.0)
Hemoglobin: 9.8 g/dL — ABNORMAL LOW (ref 12.0–15.0)
MCH: 28.9 pg (ref 26.0–34.0)
MCHC: 31.1 g/dL (ref 30.0–36.0)
MCV: 92.9 fL (ref 80.0–100.0)
Platelets: 257 10*3/uL (ref 150–400)
RBC: 3.39 MIL/uL — ABNORMAL LOW (ref 3.87–5.11)
RDW: 12.7 % (ref 11.5–15.5)
WBC: 15.7 10*3/uL — ABNORMAL HIGH (ref 4.0–10.5)
nRBC: 0 % (ref 0.0–0.2)

## 2021-12-06 LAB — POCT FERN TEST: POCT Fern Test: POSITIVE

## 2021-12-06 LAB — TYPE AND SCREEN
ABO/RH(D): B POS
Antibody Screen: NEGATIVE

## 2021-12-06 LAB — RESP PANEL BY RT-PCR (FLU A&B, COVID) ARPGX2
Influenza A by PCR: NEGATIVE
Influenza B by PCR: NEGATIVE
SARS Coronavirus 2 by RT PCR: NEGATIVE

## 2021-12-06 LAB — GROUP B STREP BY PCR: Group B strep by PCR: POSITIVE — AB

## 2021-12-06 MED ORDER — OXYCODONE-ACETAMINOPHEN 5-325 MG PO TABS
1.0000 | ORAL_TABLET | ORAL | Status: DC | PRN
  Filled 2021-12-06: qty 1

## 2021-12-06 MED ORDER — FENTANYL-BUPIVACAINE-NACL 0.5-0.125-0.9 MG/250ML-% EP SOLN: 12.0000 mL/h | EPIDURAL | Status: DC | PRN

## 2021-12-06 MED ORDER — OXYCODONE-ACETAMINOPHEN 5-325 MG PO TABS
2.0000 | ORAL_TABLET | ORAL | Status: DC | PRN
  Filled 2021-12-06 (×2): qty 2

## 2021-12-06 MED ORDER — LACTATED RINGERS IV SOLN
500.0000 mL | INTRAVENOUS | Status: DC | PRN
  Administered 2021-12-06: 1000 mL via INTRAVENOUS

## 2021-12-06 MED ORDER — PHENYLEPHRINE 40 MCG/ML (10ML) SYRINGE FOR IV PUSH (FOR BLOOD PRESSURE SUPPORT)
80.0000 ug | PREFILLED_SYRINGE | INTRAVENOUS | Status: DC | PRN
  Filled 2021-12-06: qty 10

## 2021-12-06 MED ORDER — ONDANSETRON HCL 4 MG/2ML IJ SOLN
4.0000 mg | Freq: Four times a day (QID) | INTRAMUSCULAR | Status: DC | PRN
  Administered 2021-12-06 – 2021-12-07 (×2): 4 mg via INTRAVENOUS
  Filled 2021-12-06 (×2): qty 2

## 2021-12-06 MED ORDER — PENICILLIN G POT IN DEXTROSE 60000 UNIT/ML IV SOLN
3.0000 10*6.[IU] | INTRAVENOUS | Status: DC
  Administered 2021-12-06 – 2021-12-07 (×3): 3 10*6.[IU] via INTRAVENOUS
  Filled 2021-12-06 (×4): qty 50

## 2021-12-06 MED ORDER — FENTANYL-BUPIVACAINE-NACL 0.5-0.125-0.9 MG/250ML-% EP SOLN
EPIDURAL | Status: DC | PRN
  Administered 2021-12-06: 12 mL/h via EPIDURAL

## 2021-12-06 MED ORDER — DIPHENHYDRAMINE HCL 50 MG/ML IJ SOLN: 12.5000 mg | INTRAMUSCULAR | Status: DC | PRN

## 2021-12-06 MED ORDER — ACETAMINOPHEN 325 MG PO TABS: 650.0000 mg | ORAL_TABLET | ORAL | Status: DC | PRN

## 2021-12-06 MED ORDER — EPHEDRINE 5 MG/ML INJ
10.0000 mg | INTRAVENOUS | Status: AC | PRN
  Administered 2021-12-06 – 2021-12-07 (×2): 10 mg via INTRAVENOUS

## 2021-12-06 MED ORDER — EPHEDRINE 5 MG/ML INJ
10.0000 mg | INTRAVENOUS | Status: DC | PRN
  Filled 2021-12-06: qty 5

## 2021-12-06 MED ORDER — OXYTOCIN-SODIUM CHLORIDE 30-0.9 UT/500ML-% IV SOLN
2.5000 [IU]/h | INTRAVENOUS | Status: DC
  Filled 2021-12-06: qty 500

## 2021-12-06 MED ORDER — LACTATED RINGERS IV SOLN: 500.0000 mL | Freq: Once | INTRAVENOUS | Status: DC

## 2021-12-06 MED ORDER — SOD CITRATE-CITRIC ACID 500-334 MG/5ML PO SOLN: 30.0000 mL | ORAL | Status: DC | PRN

## 2021-12-06 MED ORDER — FENTANYL CITRATE (PF) 100 MCG/2ML IJ SOLN
INTRAMUSCULAR | Status: AC
  Filled 2021-12-06: qty 2

## 2021-12-06 MED ORDER — FENTANYL-BUPIVACAINE-NACL 0.5-0.125-0.9 MG/250ML-% EP SOLN
EPIDURAL | Status: AC
  Filled 2021-12-06: qty 250

## 2021-12-06 MED ORDER — FENTANYL CITRATE (PF) 100 MCG/2ML IJ SOLN
50.0000 ug | INTRAMUSCULAR | Status: DC | PRN
  Administered 2021-12-06 (×2): 100 ug via INTRAVENOUS
  Filled 2021-12-06: qty 2

## 2021-12-06 MED ORDER — PHENYLEPHRINE 40 MCG/ML (10ML) SYRINGE FOR IV PUSH (FOR BLOOD PRESSURE SUPPORT)
80.0000 ug | PREFILLED_SYRINGE | INTRAVENOUS | Status: DC | PRN
  Administered 2021-12-06: 80 ug via INTRAVENOUS

## 2021-12-06 MED ORDER — LIDOCAINE HCL (PF) 1 % IJ SOLN
INTRAMUSCULAR | Status: DC | PRN
  Administered 2021-12-06: 5 mL via EPIDURAL

## 2021-12-06 MED ORDER — LIDOCAINE HCL (PF) 1 % IJ SOLN: 30.0000 mL | INTRAMUSCULAR | Status: DC | PRN

## 2021-12-06 MED ORDER — LACTATED RINGERS IV SOLN: INTRAVENOUS | Status: DC

## 2021-12-06 MED ORDER — OXYTOCIN BOLUS FROM INFUSION
333.0000 mL | Freq: Once | INTRAVENOUS | Status: AC
  Administered 2021-12-07: 333 mL via INTRAVENOUS

## 2021-12-06 MED ORDER — SODIUM CHLORIDE 0.9 % IV SOLN
5.0000 10*6.[IU] | Freq: Once | INTRAVENOUS | Status: AC
  Administered 2021-12-06: 5 10*6.[IU] via INTRAVENOUS
  Filled 2021-12-06: qty 5

## 2021-12-06 NOTE — MAU Note (Signed)
Catherine Collins is a 26 y.o. at [redacted]w[redacted]d here in MAU reporting: ongoing pain since last night. This AM when she got up she saw a lot of mucus and a little puddle of fluid. States she has continued to leak. Fluid is clear. No IC. No bleeding. No FM since 11 this AM.   Onset of complaint: ongoing  Pain score: 7/10  Vitals:   12/06/21 1318  BP: 121/68  Pulse: 63  Resp: 18  Temp: 98.1 F (36.7 C)  SpO2: 97%     FHT:153  Lab orders placed from triage: none

## 2021-12-06 NOTE — Anesthesia Preprocedure Evaluation (Signed)
Anesthesia Evaluation  Patient identified by MRN, date of birth, ID band Patient awake    Reviewed: Allergy & Precautions, NPO status , Patient's Chart, lab work & pertinent test results  Airway Mallampati: II  TM Distance: >3 FB Neck ROM: Full    Dental no notable dental hx. (+) Teeth Intact, Dental Advisory Given   Pulmonary neg pulmonary ROS, former smoker,    Pulmonary exam normal breath sounds clear to auscultation       Cardiovascular Exercise Tolerance: Good negative cardio ROS Normal cardiovascular exam Rhythm:Regular Rate:Normal     Neuro/Psych Anxiety    GI/Hepatic negative GI ROS, Neg liver ROS,   Endo/Other    Renal/GU negative Renal ROS     Musculoskeletal   Abdominal   Peds  Hematology  (+) anemia , Lab Results      Component                Value               Date                      WBC                      15.7 (H)            12/06/2021                HGB                      9.8 (L)             12/06/2021                HCT                      31.5 (L)            12/06/2021                MCV                      92.9                12/06/2021                PLT                      257                 12/06/2021              Anesthesia Other Findings   Reproductive/Obstetrics (+) Pregnancy                             Anesthesia Physical Anesthesia Plan  ASA: 2  Anesthesia Plan: Epidural   Post-op Pain Management:    Induction:   PONV Risk Score and Plan:   Airway Management Planned:   Additional Equipment:   Intra-op Plan:   Post-operative Plan:   Informed Consent: I have reviewed the patients History and Physical, chart, labs and discussed the procedure including the risks, benefits and alternatives for the proposed anesthesia with the patient or authorized representative who has indicated his/her understanding and acceptance.     Dental advisory  given  Plan Discussed with:   Anesthesia Plan Comments: (35.2 wk G3P3  for LEA)        Anesthesia Quick Evaluation

## 2021-12-06 NOTE — H&P (Addendum)
OBSTETRIC ADMISSION HISTORY AND PHYSICAL  Catherine Collins is a 26 y.o. female G57P2002 with IUP at 31w2dby [place] for PPROM. She reports +FMs, no VB, no blurry vision, headaches or peripheral edema, and RUQ pain.  She plans on breast and bottle feeding. She is interested in depo for birth control. She received her prenatal care at [facility]  Dating: By [place]--->  Estimated Date of Delivery: 01/08/22  Sono:    _0 , CWD, normal anatomy, cephalic presentation, anterior placenta, 2154g, 53% EFW   Prenatal History/Complications:  -Chlamydia infection in first trimester, treated with azithromycin and tested negative in second trimester -Hx of prior pregnancy with IUGR newborn, NOT present in this pregnancy     Past Medical History: Past Medical History:  Diagnosis Date   Anemia    Anxiety    Depression    Mental disorder    Supervision of other normal pregnancy, antepartum 07/13/2019    Nursing Staff Provider Office Location  [facility] Dating  Uncertain LOIZ/12Language   ENGLISH Anatomy [place] Normal Flu Vaccine   Genetic Screen  NIPS: Low risk Female  AFP:    TDaP vaccine    Hgb A1C or  GTT Early  Third trimester  Rhogam  NA   LAB RESULTS  Feeding Plan  both Blood Type B/Positive/-- (08/13 1437)  Contraception  Condoms Antibody Negative (08/13 1437) Circumcision  yes if boy [place]   Past Surgical History: Past Surgical History:  Procedure Laterality Date   WISDOM TOOTH EXTRACTION      Obstetrical History: OB History     Gravida  3   Para  2   Term  2   Preterm      AB      Living  2      SAB      IAB      Ectopic      Multiple  0   Live Births  2           Social History Social History   Socioeconomic History   Marital status: Single    Spouse name: Not on file   Number of children: 1   Years of education: Not on file   Highest education level: 10th grade  Occupational History   Not on file  Tobacco Use   Smoking status: Former    Packs/day:  0.50    Types: Cigarettes    Quit date: 02/11/2018    Years since quitting: 3.8   Smokeless tobacco: Never   Tobacco comments:    stopped smoking after preg confirm  Vaping Use   Vaping Use: Never used  Substance and Sexual Activity   Alcohol use: Not Currently    Alcohol/week: 0.0 standard drinks   Drug use: No   Sexual activity: Yes    Birth control/protection: None  Other Topics Concern   Not on file  Social History Narrative   Lives with child   Social Determinants of Health   Financial Resource Strain: Not on file  Food Insecurity: Not on file  Transportation Needs: Not on file  Physical Activity: Not on file  Stress: Not on file  Social Connections: Not on file    Family History: Family History  Problem Relation Age of Onset   Drug abuse Maternal Grandfather    Diabetes Maternal Grandfather    Hypertension Maternal Grandfather    Vision loss Maternal Grandfather    Cancer Maternal Grandmother    COPD Maternal Grandmother  Allergies: Allergies  Allergen Reactions   Betamethasone Rash    Medications Prior to Admission  Medication Sig Dispense Refill Last Dose   Prenat-Fe Poly-Methfol-FA-DHA (VITAFOL ULTRA) 29-0.6-0.4-200 MG CAPS Take 1 capsule by mouth daily before breakfast. 90 capsule 4 12/06/2021   Blood Pressure Monitor KIT 1 kit by Does not apply route once a week. Check BP weekly. Large Cuff DX: Z34.6 1 kit 0    terconazole (TERAZOL 7) 0.4 % vaginal cream INSERT 1 APPLICATORFUL VAGINALLY  AT BEDTIME 45 g 0      Review of Systems   All systems reviewed and negative except as stated in HPI  Blood pressure (!) 109/57, pulse (!) 103, temperature 98.2 F (36.8 C), temperature source Oral, resp. rate 18, height _0  (1.575 m), weight 65.9 kg, last menstrual period 03/13/2021, SpO2 97 %, unknown if currently breastfeeding. General appearance: alert, cooperative, and no distress Lungs: normal work of breathing Heart: tachycardic, well  perfused Abdomen: soft, non-tender, gravid Extremities: no edema, erythema or tenderness to palpation of BLEs Presentation: cephalic Fetal monitoringBaseline: 140 bpm, Variability: Good {> 6 bpm), Accelerations: Reactive, and Decelerations: Absent Uterine activityFrequency: 9 times per hour and Duration: 30-60 seconds Dilation: 4 Effacement (%): 90 Station: -2 Exam by:: Higinio Plan, DO   Prenatal labs: ABO, Rh: --/--/B POS (01/07 1420) Antibody: NEG (01/07 1420) Rubella: 1.05 (07/13 1158) RPR: Non Reactive (11/17 0845)  HBsAg: Negative (07/13 1158)  HIV: Non Reactive (11/17 0845)  GBS:   pending 2 hr Glucola normal Genetic screening  NIPS negative, AFP negative, Horizon negative  Anatomy US normal  Prenatal Transfer Tool  Maternal Diabetes: No Genetic Screening: Normal Maternal Ultrasounds/Referrals: Normal Fetal Ultrasounds or other Referrals:  None Maternal Substance Abuse:  No Significant Maternal Medications:  None Significant Maternal Lab Results: None  Results for orders placed or performed during the hospital encounter of 12/06/21 (from the past 24 hour(s))  Fern Test   Collection Time: 12/06/21  1:41 PM  Result Value Ref Range   POCT Fern Test Positive = ruptured amniotic membanes   Resp Panel by RT-PCR (Flu A&B, Covid) Nasopharyngeal Swab   Collection Time: 12/06/21  1:52 PM   Specimen: Nasopharyngeal Swab; Nasopharyngeal(NP) swabs in vial transport medium  Result Value Ref Range   SARS Coronavirus 2 by RT PCR NEGATIVE NEGATIVE   Influenza A by PCR NEGATIVE NEGATIVE   Influenza B by PCR NEGATIVE NEGATIVE  CBC   Collection Time: 12/06/21  2:09 PM  Result Value Ref Range   WBC 15.7 (H) 4.0 - 10.5 K/uL   RBC 3.39 (L) 3.87 - 5.11 MIL/uL   Hemoglobin 9.8 (L) 12.0 - 15.0 g/dL   HCT 31.5 (L) 36.0 - 46.0 %   MCV 92.9 80.0 - 100.0 fL   MCH 28.9 26.0 - 34.0 pg   MCHC 31.1 30.0 - 36.0 g/dL   RDW 12.7 11.5 - 15.5 %   Platelets 257 150 - 400 K/uL   nRBC 0.0 0.0 - 0.2 %   Type and screen [place]   Collection Time: 12/06/21  2:20 PM  Result Value Ref Range   ABO/RH(D) B POS    Antibody Screen NEG    Sample Expiration      12/09/2021,2359 Performed at [facility], [place] [address] [place], [place] [address]   Results for orders placed or performed during the hospital encounter of 12/05/21 (from the past 24 hour(s))  Urinalysis, Routine w reflex microscopic Urine, Clean Catch   Collection  Time: 12/05/21  6:23 PM  Result Value Ref Range   Color, Urine YELLOW YELLOW   APPearance CLEAR CLEAR   Specific Gravity, Urine 1.020 1.005 - 1.030   pH 7.5 5.0 - 8.0   Glucose, UA NEGATIVE NEGATIVE mg/dL   Hgb urine dipstick TRACE (A) NEGATIVE   Bilirubin Urine NEGATIVE NEGATIVE   Ketones, ur NEGATIVE NEGATIVE mg/dL   Protein, ur NEGATIVE NEGATIVE mg/dL   Nitrite NEGATIVE NEGATIVE   Leukocytes,Ua SMALL (A) NEGATIVE  Urinalysis, Microscopic (reflex)   Collection Time: 12/05/21  6:23 PM  Result Value Ref Range   RBC / HPF 0-5 0 - 5 RBC/hpf   WBC, UA 6-10 0 - 5 WBC/hpf   Bacteria, UA RARE (A) NONE SEEN   Squamous Epithelial / LPF 6-10 0 - 5   Mucus PRESENT    Budding Yeast PRESENT    Hyaline Casts, UA PRESENT     Patient Active Problem List   Diagnosis Date Noted   Pregnancy 12/06/2021   Supervision of other normal pregnancy, antepartum 07/30/2021   Chlamydia infection affecting pregnancy 07/30/2021   Marijuana use 07/03/2021   History of prior pregnancy with IUGR newborn 09/13/2018   History of tobacco use 09/13/2018   History of depression 03/29/2018    Assessment/Plan:  KIANA HOLLAR is a 26 y.o. G3P2002 at 92w2dhere for preterm PROM  #Labor   PPROM: Plan to proceed with IOL. Contracting spontaneously s/p ROM, can continue with expectant management and consider pitocin after next cervical check if needed. Late pre-term, will not give BMZ at this time.  #Pain: PRN, does want epidural #FWB: Cat 1 #ID: GBS unknown and  pre-term> PCN ordered. GBS PCR collected and pending.  #MOF: breast and bottle #MOC: interested in depo #Circ: NA  SPrecious Gilding DO  12/06/2021, 4:45 PM  GME ATTESTATION:  I saw and evaluated the patient. I agree with the findings and the plan of care as documented in the residents note.  SDarrelyn Hillock DO OB Fellow, [place]1/05/2022 8:50 PM

## 2021-12-06 NOTE — Anesthesia Procedure Notes (Signed)
Epidural Patient location during procedure: OB Start time: 12/06/2021 5:49 PM End time: 12/06/2021 6:01 PM  Staffing Anesthesiologist: Trevor Iha, MD Performed: anesthesiologist   Preanesthetic Checklist Completed: patient identified, IV checked, site marked, risks and benefits discussed, surgical consent, monitors and equipment checked, pre-op evaluation and timeout performed  Epidural Patient position: sitting Prep: DuraPrep and site prepped and draped Patient monitoring: continuous pulse ox and blood pressure Approach: midline Location: L3-L4 Injection technique: LOR air  Needle:  Needle type: Tuohy  Needle gauge: 17 G Needle length: 9 cm and 9 Needle insertion depth: 6 cm Catheter type: closed end flexible Catheter size: 19 Gauge Catheter at skin depth: 11 cm Test dose: negative  Assessment Events: blood not aspirated, injection not painful, no injection resistance, no paresthesia and negative IV test  Additional Notes Patient identified. Risks/Benefits/Options discussed with patient including but not limited to bleeding, infection, nerve damage, paralysis, failed block, incomplete pain control, headache, blood pressure changes, nausea, vomiting, reactions to medication both or allergic, itching and postpartum back pain. Confirmed with bedside nurse the patient's most recent platelet count. Confirmed with patient that they are not currently taking any anticoagulation, have any bleeding history or any family history of bleeding disorders. Patient expressed understanding and wished to proceed. All questions were answered. Sterile technique was used throughout the entire procedure. Please see nursing notes for vital signs. Test dose was given through epidural needle and negative prior to continuing to dose epidural or start infusion. Warning signs of high block given to the patient including shortness of breath, tingling/numbness in hands, complete motor block, or any concerning  symptoms with instructions to call for help. Patient was given instructions on fall risk and not to get out of bed. All questions and concerns addressed with instructions to call with any issues. 1 Attempt (S) . Patient tolerated procedure well.

## 2021-12-06 NOTE — MAU Provider Note (Signed)
Event Date/Time   First Provider Initiated Contact with Patient 12/06/21 1341       S: [name] is a 26 y.o. G3P2002 at [redacted]w[redacted]d  who presents to MAU today complaining of leaking of fluid since 0900. She denies vaginal bleeding. She denies contractions. She reports normal fetal movement.    O: BP 121/68 (BP Location: Right Arm)    Pulse 63    Temp 98.1 F (36.7 C) (Oral)    Resp 18    LMP 03/13/2021    SpO2 97% Comment: room air GENERAL: Well-developed, well-nourished female in no acute distress.  HEAD: Normocephalic, atraumatic.  CHEST: Normal effort of breathing, regular heart rate ABDOMEN: Soft, nontender, gravid PELVIC: deferred- grossly ruptured  Cervical exam:  Presentation: Vertex (per bedsdie U/S)  Pt informed that the ultrasound is considered a limited OB ultrasound and is not intended to be a complete ultrasound exam.  Patient also informed that the ultrasound is not being completed with the intent of assessing for fetal or placental anomalies or any pelvic abnormalities.  Explained that the purpose of todays ultrasound is to assess for  presentation.  Patient acknowledges the purpose of the exam and the limitations of the study.      Fetal Monitoring: Baseline: 140 Variability: moderate Accelerations: 15x15 Decelerations: none Contractions: 5-10  Results for orders placed or performed during the hospital encounter of 12/06/21 (from the past 24 hour(s))  Fern Test     Status: Abnormal   Collection Time: 12/06/21  1:41 PM  Result Value Ref Range   POCT Fern Test Positive = ruptured amniotic membanes      A: [facility] at [redacted]w[redacted]d  SROM  P: RN to call report to labor team  Wende Mott, CNM 12/06/2021 1:45 PM

## 2021-12-07 ENCOUNTER — Encounter (HOSPITAL_COMMUNITY): Payer: Self-pay | Admitting: Student

## 2021-12-07 DIAGNOSIS — O42919 Preterm premature rupture of membranes, unspecified as to length of time between rupture and onset of labor, unspecified trimester: Secondary | ICD-10-CM | POA: Diagnosis present

## 2021-12-07 DIAGNOSIS — O42013 Preterm premature rupture of membranes, onset of labor within 24 hours of rupture, third trimester: Secondary | ICD-10-CM

## 2021-12-07 DIAGNOSIS — O99820 Streptococcus B carrier state complicating pregnancy: Secondary | ICD-10-CM

## 2021-12-07 DIAGNOSIS — Z3A35 35 weeks gestation of pregnancy: Secondary | ICD-10-CM

## 2021-12-07 LAB — RPR: RPR Ser Ql: NONREACTIVE

## 2021-12-07 MED ORDER — PRENATAL MULTIVITAMIN CH
1.0000 | ORAL_TABLET | Freq: Every day | ORAL | Status: DC
  Administered 2021-12-07 – 2021-12-08 (×2): 1 via ORAL
  Filled 2021-12-07 (×2): qty 1

## 2021-12-07 MED ORDER — COCONUT OIL OIL
1.0000 "application " | TOPICAL_OIL | Status: DC | PRN
  Administered 2021-12-07: 1 via TOPICAL

## 2021-12-07 MED ORDER — SIMETHICONE 80 MG PO CHEW: 80.0000 mg | CHEWABLE_TABLET | ORAL | Status: DC | PRN

## 2021-12-07 MED ORDER — ACETAMINOPHEN 325 MG PO TABS: 650.0000 mg | ORAL_TABLET | ORAL | Status: DC | PRN

## 2021-12-07 MED ORDER — ONDANSETRON HCL 4 MG/2ML IJ SOLN: 4.0000 mg | INTRAMUSCULAR | Status: DC | PRN

## 2021-12-07 MED ORDER — BENZOCAINE-MENTHOL 20-0.5 % EX AERO
1.0000 "application " | INHALATION_SPRAY | CUTANEOUS | Status: DC | PRN
  Administered 2021-12-07: 1 via TOPICAL
  Filled 2021-12-07: qty 56

## 2021-12-07 MED ORDER — IBUPROFEN 600 MG PO TABS
600.0000 mg | ORAL_TABLET | Freq: Four times a day (QID) | ORAL | Status: DC
  Administered 2021-12-07 – 2021-12-08 (×6): 600 mg via ORAL
  Filled 2021-12-07 (×8): qty 1

## 2021-12-07 MED ORDER — DIPHENHYDRAMINE HCL 25 MG PO CAPS: 25.0000 mg | ORAL_CAPSULE | Freq: Four times a day (QID) | ORAL | Status: DC | PRN

## 2021-12-07 MED ORDER — DIBUCAINE (PERIANAL) 1 % EX OINT: 1.0000 "application " | TOPICAL_OINTMENT | CUTANEOUS | Status: DC | PRN

## 2021-12-07 MED ORDER — MAGNESIUM HYDROXIDE 400 MG/5ML PO SUSP: 30.0000 mL | ORAL | Status: DC | PRN

## 2021-12-07 MED ORDER — ONDANSETRON HCL 4 MG PO TABS: 4.0000 mg | ORAL_TABLET | ORAL | Status: DC | PRN

## 2021-12-07 MED ORDER — OXYCODONE-ACETAMINOPHEN 5-325 MG PO TABS
2.0000 | ORAL_TABLET | ORAL | Status: DC | PRN
  Administered 2021-12-07 – 2021-12-08 (×3): 2 via ORAL
  Filled 2021-12-07: qty 2

## 2021-12-07 MED ORDER — OXYTOCIN-SODIUM CHLORIDE 30-0.9 UT/500ML-% IV SOLN
1.0000 m[IU]/min | INTRAVENOUS | Status: DC
  Administered 2021-12-07: 2 m[IU]/min via INTRAVENOUS

## 2021-12-07 MED ORDER — WITCH HAZEL-GLYCERIN EX PADS: 1.0000 "application " | MEDICATED_PAD | CUTANEOUS | Status: DC | PRN

## 2021-12-07 MED ORDER — MEASLES, MUMPS & RUBELLA VAC IJ SOLR: 0.5000 mL | Freq: Once | INTRAMUSCULAR | Status: DC

## 2021-12-07 MED ORDER — TERBUTALINE SULFATE 1 MG/ML IJ SOLN: 0.2500 mg | Freq: Once | INTRAMUSCULAR | Status: DC | PRN

## 2021-12-07 MED ORDER — TETANUS-DIPHTH-ACELL PERTUSSIS 5-2.5-18.5 LF-MCG/0.5 IM SUSY: 0.5000 mL | PREFILLED_SYRINGE | Freq: Once | INTRAMUSCULAR | Status: DC

## 2021-12-07 MED ORDER — OXYCODONE-ACETAMINOPHEN 5-325 MG PO TABS
1.0000 | ORAL_TABLET | ORAL | Status: DC | PRN
  Administered 2021-12-09: 1 via ORAL

## 2021-12-07 NOTE — Lactation Note (Signed)
This note was copied from a baby's chart. Lactation Consultation Note  Patient Name: Catherine Collins [id number] Date: 12/07/2021 Reason for consult: Initial assessment;NICU baby;Late-preterm 34-36.6wks;Infant < 6lbs Age:24 hours  Visited with mom of 39 hours old LPI NICU female, she's a P3 and reported (+) breast changes during the pregnancy. She directly BF her last child but had to stop BF and pumping because she had to go back to work.  Mom is already pumping at the hospital and getting volume, praised her for her efforts. Reviewed pumping schedule, pumping log, lactogenesis II and benefits of premature milk.  Maternal Data Has patient been taught Hand Expression?: Yes Does the patient have breastfeeding experience prior to this delivery?: Yes How long did the patient breastfeed?: 2-3 months (first baby was exclusively pumping and bottle feeding only, second baby went to breast)  Feeding Mother's Current Feeding Choice: Breast Milk  Lactation Tools Discussed/Used Tools: Pump;Flanges;Coconut oil Flange Size: 24 Breast pump type: Double-Electric Breast Pump Pump Education: Setup, frequency, and cleaning;Milk Storage Reason for Pumping: LPI in NICU Pumping frequency: q 3 hours (recommneded) Pumped volume: 20 mL  Interventions Interventions: Breast feeding basics reviewed;Coconut oil;DEBP;Education;"The NICU and Your Baby" book;LC Services brochure  Plan of care  Encouraged mom to continue pumping consistently every 3 hours, at least 8 pumping sessions/24 hours Breast massage, hand expression and coconut oil were also encouraged prior pumping Mom will take all pump pieces with her to baby's room on her d/c date  FOB present but asleep. All questions and concerns answered, mom to call NICU LC PRN.  Discharge Pump: DEBP WIC Program: Yes [facility] Status Consult Status: Follow-up Date: 12/07/21 Follow-up type: In-patient   Severina Sykora Francene Boyers 12/07/2021, 1:05 PM

## 2021-12-07 NOTE — Anesthesia Postprocedure Evaluation (Signed)
Anesthesia Post Note  Patient: Catherine Collins  Procedure(s) Performed: AN AD HOC LABOR EPIDURAL     Patient location during evaluation: Mother Baby Anesthesia Type: Epidural Level of consciousness: awake and alert Pain management: pain level controlled Vital Signs Assessment: post-procedure vital signs reviewed and stable Respiratory status: spontaneous breathing, nonlabored ventilation and respiratory function stable Cardiovascular status: stable Postop Assessment: no headache, no backache, epidural receding, no apparent nausea or vomiting, patient able to bend at knees, adequate PO intake and able to ambulate Anesthetic complications: no   No notable events documented.  Last Vitals:  Vitals:   12/07/21 1045 12/07/21 1638  BP: (!) 103/51 108/63  Pulse: (!) 47 88  Resp: 16 16  Temp: 36.8 C 36.5 C  SpO2: 100% 98%    Last Pain:  Vitals:   12/07/21 1638  TempSrc: Other (Comment)  PainSc:    Pain Goal:                   [employer]

## 2021-12-07 NOTE — Discharge Summary (Signed)
Postpartum Discharge Summary  Date of Service updated 12/09/2021      Patient Name: Catherine Collins DOB: 08-Mar-1996 MRN: [id number]  Date of admission: 12/06/2021 Delivery date:12/07/2021  Delivering provider: Renard Matter  Date of discharge: 12/09/2021   Admitting diagnosis: Pregnancy [Z34.90] Intrauterine pregnancy: [redacted]w[redacted]d    Secondary diagnosis:  Principal Problem:   Pregnancy Active Problems:   Supervision of other normal pregnancy, antepartum   Chlamydia infection affecting pregnancy   Preterm premature rupture of membranes   Preterm delivery   Vaginal delivery  Additional problems: None    Discharge diagnosis: Preterm Pregnancy Delivered                                              Post partum procedures: none Augmentation: Pitocin Complications: None  Hospital course: Onset of Labor With Vaginal Delivery      26y.o. yo [id number] admitted for PPROM on 12/06/2021. Patient had an uncomplicated labor course as follows:  Membrane Rupture Time/Date: 12:00 PM ,12/06/2021   Delivery Method:Vaginal, Spontaneous  Episiotomy: None  Lacerations:  None  Patient had an uncomplicated postpartum course.  She is ambulating, tolerating a regular diet, passing flatus, and urinating well. Patient is discharged home in stable condition on 12/09/21.  Newborn Data: Birth date:12/07/2021  Birth time:7:37 AM  Gender:Female  Living status:Living  Apgars:8 ,8  Weight:2550 g   Magnesium Sulfate received: No BMZ received: No Rhophylac:N/A MMR:N/A T-DaP:   Offer postpartum Flu: given prenatally Transfusion:No  Physical exam  Vitals:   12/08/21 1641 12/08/21 2105 12/09/21 0417 12/09/21 0804  BP: (!) 102/53 110/61 106/61 107/64  Pulse: (!) 59 97 (!) 45 82  Resp: '18 18 18 16  ' Temp: 98.1 F (36.7 C) 97.6 F (36.4 C) 97.9 F (36.6 C) 97.9 F (36.6 C)  TempSrc: Oral Oral Oral Oral  SpO2: 100% 100% 100% 99%  Weight:      Height:       General: alert, cooperative, and no  distress Lochia: appropriate Uterine Fundus: firm Incision: N/A DVT Evaluation: No evidence of DVT seen on physical exam. Labs: Lab Results  Component Value Date   WBC 15.7 (H) 12/06/2021   HGB 9.8 (L) 12/06/2021   HCT 31.5 (L) 12/06/2021   MCV 92.9 12/06/2021   PLT 257 12/06/2021   CMP Latest Ref Rng & Units 08/22/2021  Glucose 70 - 99 mg/dL 85  BUN 6 - 20 mg/dL 11  Creatinine 0.44 - 1.00 mg/dL 0.53  Sodium 135 - 145 mmol/L 135  Potassium 3.5 - 5.1 mmol/L 3.9  Chloride 98 - 111 mmol/L 105  CO2 22 - 32 mmol/L 24  Calcium 8.9 - 10.3 mg/dL 8.8(L)  Total Protein 6.5 - 8.1 g/dL 6.3(L)  Total Bilirubin 0.3 - 1.2 mg/dL 0.5  Alkaline Phos 38 - 126 U/L 48  AST 15 - 41 U/L 15  ALT 0 - 44 U/L 10   Edinburgh Score: Edinburgh Postnatal Depression Scale Screening Tool 12/08/2021  I have been able to laugh and see the funny side of things. 0  I have looked forward with enjoyment to things. 1  I have blamed myself unnecessarily when things went wrong. 3  I have been anxious or worried for no good reason. 3  I have felt scared or panicky for no good reason. 0  Things have been getting  on top of me. 2  I have been so unhappy that I have had difficulty sleeping. 1  I have felt sad or miserable. 2  I have been so unhappy that I have been crying. 2  The thought of harming myself has occurred to me. 0  Edinburgh Postnatal Depression Scale Total 14     After visit meds:  Allergies as of 12/09/2021       Reactions   Betamethasone Rash        Medication List     STOP taking these medications    terconazole 0.4 % vaginal cream Commonly known as: TERAZOL 7       TAKE these medications    Blood Pressure Monitor Kit 1 kit by Does not apply route once a week. Check BP weekly. Large Cuff DX: Z34.6   ibuprofen 600 MG tablet Commonly known as: ADVIL Take 1 tablet (600 mg total) by mouth every 6 (six) hours.   Vitafol Ultra 29-0.6-0.4-200 MG Caps Take 1 capsule by mouth daily  before breakfast.         Discharge home in stable condition Infant Feeding: Breast Infant Disposition:NICU Discharge instruction: per After Visit Summary and Postpartum booklet. Activity: Advance as tolerated. Pelvic rest for 6 weeks.  Diet: routine diet Future Appointments: Future Appointments  Date Time Provider [place]  01/01/2022  1:10 PM Shelly Bombard, MD [place] None   Follow up Visit:  [place] Follow up in 4 week(s).   Specialty: Obstetrics and Gynecology Contact information: [address], [place] [address] [place] [phone]               Message sentto [facility] by Dr. Cy Blamer on 12/07/2020  Please schedule this patient for a In person postpartum visit in 4 weeks with the following provider: MD. Additional Postpartum F/U: None   High risk pregnancy complicated by:  PPROM Delivery mode:  Vaginal, Spontaneous  Anticipated Birth Control:   Plans depo   12/09/2021 Emeterio Reeve, MD

## 2021-12-08 NOTE — Progress Notes (Addendum)
CSW acknowledged consult for Edinburgh 14.  CSW provided PMAD education and resources when CSW completed assessment earlier today (See CSW's note).  Blaine Hamper, MSW, LCSW Clinical Social Work [phone]

## 2021-12-08 NOTE — Clinical Social Work Maternal (Signed)
CLINICAL SOCIAL WORK MATERNAL/CHILD NOTE  Patient Details  Name: Catherine Collins MRN: [id number] Date of Birth: August 11, 1996  Date:  12/08/2021  Clinical Social Worker Initiating Note:  Laurey Arrow Date/Time: Initiated:  12/08/21/1246     Child's Name:  Catherine Collins   Biological Parents:  Mother, Father   Need for Interpreter:  None   Reason for Referral:  Behavioral Health Concerns, Current Substance Use/Substance Use During Pregnancy     Address:  [address] [place] [address]    Phone number:  [phone] (home)     Additional phone number: FOB's number is [phone]  Household Members/Support Persons (HM/SP):   Household Member/Support Person 1, Household Member/Support Person 2, Household Member/Support Person 3   HM/SP Name Relationship DOB or Age  HM/SP -1 Catherine Collins FOB 06/08/1995  HM/SP -2 Catherine Collins daughter 09/14/2018  HM/SP -3 Catherine Collins son 3/12021  HM/SP -4        HM/SP -5        HM/SP -6        HM/SP -7        HM/SP -8          Natural Supports (not living in the home):  Immediate Family, Extended Family, Artist Supports: None   Employment: Unemployed   Type of Work:     Education:  Programmer, systems   Homebound arranged:    Museum/gallery curator Resources:  [employer]   Other Resources:  [employer], Physicist, medical     Cultural/Religious Considerations Which May Impact Care:  Per [employer] Sheet, [name] is Non-Denominational  Strengths:  Ability to meet basic needs  , Pediatrician chosen, Home prepared for child  , Understanding of illness   Psychotropic Medications:         Pediatrician:    [place]  Pediatrician List:   [facility] ([address])  [place]      Pediatrician Fax Number:    Risk Factors/Current Problems:  Substance Use  , Transportation  , Mental Health  Concerns     Cognitive State:  Linear Thinking  , Insightful  , Alert  , Able to Concentrate     Mood/Affect:  Interested  , Comfortable  , Flat  , Relaxed     CSW Assessment: CSW met with [name] and FOB at infant's bedside in room 340 to complete an assessment for MH and SA hx. When CSW arrived, [name] was bonding with infant as evidence by holding infant and engaging in skin to skin. FOB was also present and was observing [name] and infant's interaction; everyone appeared happy and comfortable. CSW explained CSW's role and with [name]'s permission, CSW asked FOB to leave in order to assess [name] in private; FOB left without incident. [name] was polite, soft spoken, and easy to engage.   CSW asked about [name]'s MH hx.  [name] shared that she was dx with Bipolar disorder in 2016.  Per [name], initially her mood was stabilized with medication, however after the her first pregnancy she discontinued all medications. [name] reported that her mood was stable until this pregnancy.  [name] reported feeling more depressed during this pregnancy. CSW offered resources for outpatient counseling and medication; [name] was accepting of resources.  CSW provided education regarding the baby blues period vs. perinatal mood disorders, discussed treatment and gave resources for mental  health follow up if concerns arise.  CSW recommends self-evaluation during the postpartum time period using the New Mom Checklist from Postpartum Progress and encouraged [name] to contact a medical professional if symptoms are noted at any time. [name] presented with insight and awareness and denied SI, HI, and DV when CSW assessed for safety. [name] reports having a good support team and expressed feeling comfortable seeking help if additional help is needed.   CSW asked about [name]'s SA hx.  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  [name] reported her last use was "around June/July 2022." CSW reviewed hospital's SA policy and [name] was understanding.  [name] expressed  not being concerned about required drug screens for infant.  [name] denied the use of all other illicit substances and she declined resources for substance use. [name] reported that she has never had CPS involvement. [name]  is aware that CSW will monitor infant's drug screens and will make a report to [facility] if warranted.   Per [name], she and FOB have all essential items to care for infant and is prepared for infant's future discharge.   CSW will continue to offer resources and supports to family while infant remains in NICU.    CSW Plan/Description:  Psychosocial Support and Ongoing Assessment of Needs, Sudden Infant Death Syndrome (SIDS) Education, Perinatal Mood and Anxiety Disorder (PMADs) Education, Other Patient/Family Education, [place], Other Information/Referral to [employer], CSW Will Continue to Monitor Umbilical Cord Tissue Drug Screen Results and Make Report if Warranted   Laurey Arrow, MSW, LCSW Clinical Social Work [phone]  Dimple Nanas, LCSW 12/08/2021, 12:53 PM

## 2021-12-08 NOTE — Lactation Note (Signed)
This note was copied from a baby's chart.  NICU Lactation Consultation Note  Patient Name: Catherine Collins [id number] Date: 12/08/2021 Age:24 hours   Subjective Reason for consult: Follow-up assessment Mother continues to pump and hand express frequently. We reviewed volume norms on day 2 pp.  Mother is pending a WIC pump. She is aware of [facility] services in NICU for bf'ing assistance when baby is ready.  Objective Infant data: Mother's Current Feeding Choice: Breast Milk and Formula  Infant feeding assessment Scale for Readiness: 5 (on HFNC, strong suck on paci)     Maternal data: [id number]  Vaginal, Spontaneous Does the patient have breastfeeding experience prior to this delivery?: Yes How long did the patient breastfeed?: 2-3 months (first baby was exclusively pumping and bottle feeding only, second baby went to breast)  Pumping frequency: q3 + hand expression: 5+ mls Pumped volume: 20 mL Flange Size: 24   WIC Program: Yes WIC Referral Sent?: Yes Pump: DEBP  Assessment Maternal: Milk volume: Normal   Intervention/Plan Interventions: Education  Tools: Pump; 78F feeding tube / Syringe Pump Education: Setup, frequency, and cleaning; Milk Storage  Plan: Consult Status: Follow-up  NICU Follow-up type: Weekly NICU follow up; Verify onset of copious milk; Verify absence of engorgement  Mother to continue pumping q3 and bringing milk to NICU. Mother to f/u with [facility] for pump issuance.   Gwynne Edinger 12/08/2021, 10:57 AM

## 2021-12-08 NOTE — Progress Notes (Signed)
Post Partum Day 1 Subjective: no complaints, up ad lib, voiding, and tolerating PO  Objective: Blood pressure 106/65, pulse 79, temperature 97.8 F (36.6 C), temperature source Oral, resp. rate 17, height 5\' 2"  (1.575 m), weight 65.9 kg, last menstrual period 03/13/2021, SpO2 99 %, unknown if currently breastfeeding.  Physical Exam:  General: alert, cooperative, and no distress Lochia: appropriate Uterine Fundus: firm DVT Evaluation: No evidence of DVT seen on physical exam.  Recent Labs    12/06/21 1409  HGB 9.8*  HCT 31.5*    Assessment/Plan: Plan for discharge tomorrow, Breastfeeding, and Contraception considering DMPA   LOS: 2 days   02/03/22 12/08/2021, 9:32 AM

## 2021-12-09 ENCOUNTER — Ambulatory Visit: Payer: Self-pay

## 2021-12-09 LAB — SURGICAL PATHOLOGY

## 2021-12-09 MED ORDER — IBUPROFEN 600 MG PO TABS: 600.0000 mg | ORAL_TABLET | Freq: Four times a day (QID) | ORAL | 0 refills | Status: DC

## 2021-12-09 NOTE — Lactation Note (Signed)
This note was copied from a baby's chart. Lactation Consultation Note  Patient Name: Girl Trini Soldo [id number] Date: 12/09/2021 Reason for consult: Follow-up assessment;NICU baby;Late-preterm 34-36.6wks;Infant < 6lbs;Maternal discharge Age:24 hours  Visited with mom of 4 hours old LPI NICU female, she's a P3 and getting discharged today. Reviewed discharge education, pumping schedule, lactogenesis II, expectations and feeding cues. Mom forgot to take pump pieces to her room and by the time [facility] called [facility] Specialty care, the tubing was already discarded. Provided an extra set and set up pump in baby's room.  SLP Irving Burton requested LC to F/U with mom but by the time LC came in the room baby was already asleep. Per mom she was awake and cueing and latched at the breast for 10 minutes, although only 3-5 minutes were net sucking time. Mom voiced she feels comfortable with taking baby to breast on her own; asked her to call for assistance whenever is needed.  Reviewed hand expression and showed her how to finger feed baby 1 drop at a time. Mom reports that she's still not getting much milk from her left side, she does though with hand expression but not with the pump. She hasn't been pumping at night. Explained to mom the importance of consistent pumping for the onset of lactogenesis II.  Maternal Data  Mom's supply has slightly dwindled in comparison to day 1 but it's still WNL.  Feeding Mother's Current Feeding Choice: Breast Milk and Formula  Lactation Tools Discussed/Used Tools: Pump;Flanges Flange Size: 24 Breast pump type: Double-Electric Breast Pump Pump Education: Setup, frequency, and cleaning;Milk Storage Reason for Pumping: LPI in NICU Pumping frequency: 6 times/24 hours Pumped volume: 2 mL (2-5 ml)  Interventions Interventions: Breast feeding basics reviewed;Breast massage;Hand express;DEBP;Education  Plan of care   Encouraged mom to continue pumping consistently every 3 hours,  at least 8 pumping sessions/24 hours Breast massage, hand expression and coconut oil were also encouraged prior pumping Mom will start taking baby to breast strictly on feeding cues   No other support person at this time. All questions and concerns answered, mom to call NICU LC PRN.  Discharge Discharge Education: Engorgement and breast care Pump: DEBP  Consult Status Consult Status: Follow-up Date: 12/09/21 Follow-up type: In-patient   Zoie Sarin Venetia Constable 12/09/2021, 12:15 PM

## 2021-12-09 NOTE — Plan of Care (Signed)
Patient to be discharged with printed instructions. Yordy Matton L Rexton Greulich, RN  

## 2021-12-10 ENCOUNTER — Ambulatory Visit: Payer: Self-pay

## 2021-12-10 NOTE — Lactation Note (Signed)
This note was copied from a baby's chart.  NICU Lactation Consultation Note  Patient Name: Girl Dennisha Mouser [id number] Date: 12/10/2021 Age:26 years   Subjective Reason for consult: Breastfeeding assistance; Follow-up assessment Mother is pumping often and without difficulty. She plans to pick up a pump from [facility] tomorrow for at-home use. LC was present at infant's feeding time and assisted with positioning / latch. One short suckling burst observed. Ed provided on feeding norms at 35 weeks and IDF progression.   Objective Infant data: Mother's Current Feeding Choice: Breast Milk and Formula  Infant feeding assessment Scale for Readiness: 2 Scale for Quality: 2    Maternal data: [id number]  Vaginal, Spontaneous Pumping frequency: q3 Pumped volume: 30 mL Flange Size: 24  WIC Program: Yes WIC Referral Sent?: Yes Pump: DEBP  Assessment Infant: LATCH Score: 6  Maternal: Milk volume: Normal   Intervention/Plan Interventions: Education; Support pillows; Breast feeding basics reviewed; Assisted with latch; Position options; Skin to skin; Infant Driven Feeding Algorithm education  Tools: Pump; Flanges Pump Education: Setup, frequency, and cleaning; Milk Storage  Plan: Consult Status: Follow-up  NICU Follow-up type: Verify absence of engorgement; Assist with IDF-2 (Mother does not need to pre-pump before breastfeeding)  Mother to continue pumping q3. Mother to continue offering breast at feeding time when baby is awake and cuing.  LC will plan return visit to further assist prn.   Elder Negus 12/10/2021, 5:27 PM

## 2021-12-17 ENCOUNTER — Ambulatory Visit: Payer: Medicaid Other

## 2021-12-22 ENCOUNTER — Telehealth (HOSPITAL_COMMUNITY): Payer: Self-pay | Admitting: *Deleted

## 2021-12-22 DIAGNOSIS — Z1331 Encounter for screening for depression: Secondary | ICD-10-CM

## 2021-12-22 NOTE — Telephone Encounter (Signed)
Attempted Hospital Discharge Follow-Up Call.  Left voice mail requesting that patient return RN's phone call.  

## 2021-12-25 ENCOUNTER — Encounter: Payer: Self-pay | Admitting: Obstetrics

## 2022-01-01 ENCOUNTER — Telehealth (INDEPENDENT_AMBULATORY_CARE_PROVIDER_SITE_OTHER): Payer: Medicaid Other | Admitting: Obstetrics

## 2022-01-01 ENCOUNTER — Encounter: Payer: Self-pay | Admitting: Obstetrics

## 2022-01-01 DIAGNOSIS — Z3009 Encounter for other general counseling and advice on contraception: Secondary | ICD-10-CM

## 2022-01-01 DIAGNOSIS — F53 Postpartum depression: Secondary | ICD-10-CM

## 2022-01-01 NOTE — Progress Notes (Addendum)
Provider location: Center for [facility]   Patient location: Home  I connected withNAME@ on 01/06/22 at  1:10 PM EST by Mychart Video Encounter and verified that I am speaking with the correct person using two identifiers.       I discussed the limitations, risks, security and privacy concerns of performing an evaluation and management service virtually and the availability of in person appointments. I also discussed with the patient that there may be a patient responsible charge related to this service. The patient expressed understanding and agreed to proceed.  Post Partum Visit Note Subjective:   Catherine Collins is a 26 y.o. [phone] female who presents for a postpartum visit. She is 3 week postpartum following a normal spontaneous vaginal delivery.  I have fully reviewed the prenatal and intrapartum course. The delivery was at 35 gestational weeks.  Anesthesia: epidural. Postpartum course has been uncomplicated. Baby is doing well now, but was in the NICU for about a week. Baby is feeding by bottle - Similac Neosure. Bleeding thin lochia. Bowel function is normal. Bladder function is normal. Patient is not sexually active. Contraception method is none. Postpartum depression screening: positive.   The pregnancy intention screening data noted above was reviewed. Potential methods of contraception were discussed. The patient elected to proceed with No data recorded.   Edinburgh Postnatal Depression Scale - 01/01/22 1410       Edinburgh Postnatal Depression Scale:  In the Past 7 Days   I have been able to laugh and see the funny side of things. 0    I have looked forward with enjoyment to things. 1    I have blamed myself unnecessarily when things went wrong. 2    I have been anxious or worried for no good reason. 3    I have felt scared or panicky for no good reason. 0    Things have been getting on top of me. 2    I have been so unhappy that I have had difficulty  sleeping. 0    I have felt sad or miserable. 1    I have been so unhappy that I have been crying. 1    The thought of harming myself has occurred to me. 0    Edinburgh Postnatal Depression Scale Total 10             The following portions of the patient's history were reviewed and updated as appropriate: allergies, current medications, past family history, past medical history, past social history, past surgical history, and problem list.  Review of Systems A comprehensive review of systems was negative except for: Behavioral/Psych: positive for depression  Objective:  LMP 03/13/2021    Breastfeeding No     General:  Alert, oriented and cooperative. Patient is in no acute distress.  Respiratory: Normal respiratory effort, no problems with respiration noted  Mental Status: Normal mood and affect. Normal behavior. Normal judgment and thought content.  Rest of physical exam deferred due to type of encounter   Assessment:    1. Postpartum care following vaginal delivery  2. Depression complicating pregnancy, postpartum Rx: - Ambulatory referral to Integrated Behavioral Health  3. Encounter for other general counseling and advice on contraception - considering options    Plan:  Essential components of care per ACOG recommendations:  1.  Mood and well being: Patient with negative depression screening today. Reviewed local resources for support.  - Patient does not use tobacco.  - hx of drug  use? No   2. Infant care and feeding:  -Patient currently breastmilk feeding? No  -Social determinants of health (SDOH) reviewed in EPIC. No concerns  3. Sexuality, contraception and birth spacing - Patient does not want a pregnancy in the next year.  Desired family size is 3 children.   4. Sleep and fatigue -Encouraged family/partner/community support of 4 hrs of uninterrupted sleep to help with mood and fatigue  5. Physical Recovery  - Discussed patients delivery - Patient had no  lacerations - Patient has urinary incontinence? No - Patient is safe to resume physical and sexual activity  6.  Health Maintenance - Last pap smear done 07-31-2020 and was normal with negative HPV.  7. No Chronic Disease  I have spent a total of 20 minutes of non face-to-face time, excluding clinical staff time, reviewing notes and preparing to see patient, ordering tests and/or medications, and counseling the patient.     Return in about 4 weeks (around 01/29/2022) for postpartum visit.    Coral Ceo, MD Center for [facility], [facility]  01/01/2022

## 2022-01-06 ENCOUNTER — Encounter: Payer: Self-pay | Admitting: *Deleted

## 2022-02-03 ENCOUNTER — Institutional Professional Consult (permissible substitution): Payer: Medicaid Other | Admitting: Licensed Clinical Social Worker

## 2022-02-03 ENCOUNTER — Ambulatory Visit: Payer: Medicaid Other | Admitting: Obstetrics

## 2022-03-19 ENCOUNTER — Ambulatory Visit: Payer: Medicaid Other | Admitting: Obstetrics

## 2022-03-23 ENCOUNTER — Ambulatory Visit: Payer: Medicaid Other | Admitting: Licensed Clinical Social Worker

## 2022-03-24 ENCOUNTER — Ambulatory Visit: Payer: Medicaid Other | Admitting: Licensed Clinical Social Worker

## 2022-03-24 ENCOUNTER — Ambulatory Visit: Payer: Medicaid Other | Admitting: Obstetrics

## 2022-05-18 ENCOUNTER — Emergency Department (HOSPITAL_BASED_OUTPATIENT_CLINIC_OR_DEPARTMENT_OTHER)
Admission: EM | Admit: 2022-05-18 | Discharge: 2022-05-18 | Disposition: A | Discharge status: Home / Self Care | Payer: Medicaid Other | Attending: Emergency Medicine | Admitting: Emergency Medicine

## 2022-05-18 ENCOUNTER — Other Ambulatory Visit: Payer: Self-pay

## 2022-05-18 ENCOUNTER — Emergency Department (HOSPITAL_BASED_OUTPATIENT_CLINIC_OR_DEPARTMENT_OTHER): Payer: Medicaid Other | Admitting: Radiology

## 2022-05-18 ENCOUNTER — Encounter (HOSPITAL_BASED_OUTPATIENT_CLINIC_OR_DEPARTMENT_OTHER): Payer: Self-pay | Admitting: Emergency Medicine

## 2022-05-18 DIAGNOSIS — M79641 Pain in right hand: Secondary | ICD-10-CM | POA: Insufficient documentation

## 2022-05-18 MED ORDER — IBUPROFEN 800 MG PO TABS: 800.0000 mg | ORAL_TABLET | Freq: Once | ORAL | Status: DC

## 2022-05-18 MED ORDER — IBUPROFEN 600 MG PO TABS: 600.0000 mg | ORAL_TABLET | Freq: Four times a day (QID) | ORAL | 0 refills | Status: DC | PRN
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
## 2022-05-18 NOTE — Discharge Instructions (Addendum)
Rest, elevate hand, ice, and Motrin or Tylenol for pain.  Use Ace wrap as needed for compression and stability.

## 2022-05-18 NOTE — ED Notes (Signed)
Patient verbalizes understanding of discharge instructions. Opportunity for questioning and answers were provided. Patient discharged from ED.  °

## 2022-11-16 ENCOUNTER — Telehealth: Payer: Medicaid Other | Admitting: Physician Assistant

## 2022-11-16 DIAGNOSIS — N760 Acute vaginitis: Secondary | ICD-10-CM

## 2022-11-16 DIAGNOSIS — B9689 Other specified bacterial agents as the cause of diseases classified elsewhere: Secondary | ICD-10-CM

## 2022-11-16 MED ORDER — METRONIDAZOLE 500 MG PO TABS: 500.0000 mg | ORAL_TABLET | Freq: Two times a day (BID) | ORAL | 0 refills | Status: AC

## 2022-11-16 NOTE — Progress Notes (Signed)
E-Visit for Vaginal Symptoms  We are sorry that you are not feeling well. Here is how we plan to help! Based on what you shared with me it looks like you: May have a vaginosis due to bacteria  Vaginosis is an inflammation of the vagina that can result in discharge, itching and pain. The cause is usually a change in the normal balance of vaginal bacteria or an infection. Vaginosis can also result from reduced estrogen levels after menopause.  The most common causes of vaginosis are:   Bacterial vaginosis which results from an overgrowth of one on several organisms that are normally present in your vagina.   Yeast infections which are caused by a naturally occurring fungus called candida.   Vaginal atrophy (atrophic vaginosis) which results from the thinning of the vagina from reduced estrogen levels after menopause.   Trichomoniasis which is caused by a parasite and is commonly transmitted by sexual intercourse.  Factors that increase your risk of developing vaginosis include: Medications, such as antibiotics and steroids Uncontrolled diabetes Use of hygiene products such as bubble bath, vaginal spray or vaginal deodorant Douching Wearing damp or tight-fitting clothing Using an intrauterine device (IUD) for birth control Hormonal changes, such as those associated with pregnancy, birth control pills or menopause Sexual activity Having a sexually transmitted infection  Your treatment plan is Metronidazole or Flagyl 500mg twice a day for 7 days.  I have electronically sent this prescription into the pharmacy that you have chosen.  Be sure to take all of the medication as directed. Stop taking any medication if you develop a rash, tongue swelling or shortness of breath. Mothers who are breast feeding should consider pumping and discarding their breast milk while on these antibiotics. However, there is no consensus that infant exposure at these doses would be harmful.  Remember that  medication creams can weaken latex condoms. .   HOME CARE:  Good hygiene may prevent some types of vaginosis from recurring and may relieve some symptoms:  Avoid baths, hot tubs and whirlpool spas. Rinse soap from your outer genital area after a shower, and dry the area well to prevent irritation. Don't use scented or harsh soaps, such as those with deodorant or antibacterial action. Avoid irritants. These include scented tampons and pads. Wipe from front to back after using the toilet. Doing so avoids spreading fecal bacteria to your vagina.  Other things that may help prevent vaginosis include:  Don't douche. Your vagina doesn't require cleansing other than normal bathing. Repetitive douching disrupts the normal organisms that reside in the vagina and can actually increase your risk of vaginal infection. Douching won't clear up a vaginal infection. Use a latex condom. Both female and female latex condoms may help you avoid infections spread by sexual contact. Wear cotton underwear. Also wear pantyhose with a cotton crotch. If you feel comfortable without it, skip wearing underwear to bed. Yeast thrives in moist environments Your symptoms should improve in the next day or two.  GET HELP RIGHT AWAY IF:  You have pain in your lower abdomen ( pelvic area or over your ovaries) You develop nausea or vomiting You develop a fever Your discharge changes or worsens You have persistent pain with intercourse You develop shortness of breath, a rapid pulse, or you faint.  These symptoms could be signs of problems or infections that need to be evaluated by a medical provider now.  MAKE SURE YOU   Understand these instructions. Will watch your condition. Will get help right   away if you are not doing well or get worse.  Thank you for choosing an e-visit.  Your e-visit answers were reviewed by a board certified advanced clinical practitioner to complete your personal care plan. Depending upon the  condition, your plan could have included both over the counter or prescription medications.  Please review your pharmacy choice. Make sure the pharmacy is open so you can pick up prescription now. If there is a problem, you may contact your provider through MyChart messaging and have the prescription routed to another pharmacy.  Your safety is important to us. If you have drug allergies check your prescription carefully.   For the next 24 hours you can use MyChart to ask questions about today's visit, request a non-urgent call back, or ask for a work or school excuse. You will get an email in the next two days asking about your experience. I hope that your e-visit has been valuable and will speed your recovery.  I have spent 5 minutes in review of e-visit questionnaire, review and updating patient chart, medical decision making and response to patient.   Harman Langhans M Sunnie Odden, PA-C  

## 2023-02-26 ENCOUNTER — Telehealth: Payer: Medicaid Other | Admitting: Family Medicine

## 2023-02-26 DIAGNOSIS — B9689 Other specified bacterial agents as the cause of diseases classified elsewhere: Secondary | ICD-10-CM | POA: Diagnosis not present

## 2023-02-26 DIAGNOSIS — N760 Acute vaginitis: Secondary | ICD-10-CM | POA: Diagnosis not present

## 2023-02-26 MED ORDER — METRONIDAZOLE 500 MG PO TABS: 500.0000 mg | ORAL_TABLET | Freq: Two times a day (BID) | ORAL | 0 refills | Status: AC

## 2023-02-26 NOTE — Progress Notes (Signed)
E-Visit for Vaginal Symptoms  We are sorry that you are not feeling well. Here is how we plan to help! Based on what you shared with me it looks like you: May have a vaginosis due to bacteria  Vaginosis is an inflammation of the vagina that can result in discharge, itching and pain. The cause is usually a change in the normal balance of vaginal bacteria or an infection. Vaginosis can also result from reduced estrogen levels after menopause.  The most common causes of vaginosis are:   Bacterial vaginosis which results from an overgrowth of one on several organisms that are normally present in your vagina.   Yeast infections which are caused by a naturally occurring fungus called candida.   Vaginal atrophy (atrophic vaginosis) which results from the thinning of the vagina from reduced estrogen levels after menopause.   Trichomoniasis which is caused by a parasite and is commonly transmitted by sexual intercourse.  Factors that increase your risk of developing vaginosis include: Medications, such as antibiotics and steroids Uncontrolled diabetes Use of hygiene products such as bubble bath, vaginal spray or vaginal deodorant Douching Wearing damp or tight-fitting clothing Using an intrauterine device (IUD) for birth control Hormonal changes, such as those associated with pregnancy, birth control pills or menopause Sexual activity Having a sexually transmitted infection  Your treatment plan is Metronidazole or Flagyl 500mg twice a day for 7 days.  I have electronically sent this prescription into the pharmacy that you have chosen.  Be sure to take all of the medication as directed. Stop taking any medication if you develop a rash, tongue swelling or shortness of breath. Mothers who are breast feeding should consider pumping and discarding their breast milk while on these antibiotics. However, there is no consensus that infant exposure at these doses would be harmful.  Remember that  medication creams can weaken latex condoms. .   HOME CARE:  Good hygiene may prevent some types of vaginosis from recurring and may relieve some symptoms:  Avoid baths, hot tubs and whirlpool spas. Rinse soap from your outer genital area after a shower, and dry the area well to prevent irritation. Don't use scented or harsh soaps, such as those with deodorant or antibacterial action. Avoid irritants. These include scented tampons and pads. Wipe from front to back after using the toilet. Doing so avoids spreading fecal bacteria to your vagina.  Other things that may help prevent vaginosis include:  Don't douche. Your vagina doesn't require cleansing other than normal bathing. Repetitive douching disrupts the normal organisms that reside in the vagina and can actually increase your risk of vaginal infection. Douching won't clear up a vaginal infection. Use a latex condom. Both female and female latex condoms may help you avoid infections spread by sexual contact. Wear cotton underwear. Also wear pantyhose with a cotton crotch. If you feel comfortable without it, skip wearing underwear to bed. Yeast thrives in moist environments Your symptoms should improve in the next day or two.  GET HELP RIGHT AWAY IF:  You have pain in your lower abdomen ( pelvic area or over your ovaries) You develop nausea or vomiting You develop a fever Your discharge changes or worsens You have persistent pain with intercourse You develop shortness of breath, a rapid pulse, or you faint.  These symptoms could be signs of problems or infections that need to be evaluated by a medical provider now.  MAKE SURE YOU   Understand these instructions. Will watch your condition. Will get help right   away if you are not doing well or get worse.  Thank you for choosing an e-visit.  Your e-visit answers were reviewed by a board certified advanced clinical practitioner to complete your personal care plan. Depending upon the  condition, your plan could have included both over the counter or prescription medications.  Please review your pharmacy choice. Make sure the pharmacy is open so you can pick up prescription now. If there is a problem, you may contact your provider through MyChart messaging and have the prescription routed to another pharmacy.  Your safety is important to us. If you have drug allergies check your prescription carefully.   For the next 24 hours you can use MyChart to ask questions about today's visit, request a non-urgent call back, or ask for a work or school excuse. You will get an email in the next two days asking about your experience. I hope that your e-visit has been valuable and will speed your recovery.    have provided 5 minutes of non face to face time during this encounter for chart review and documentation.   

## 2023-04-22 ENCOUNTER — Ambulatory Visit: Payer: Medicaid Other
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
## 2024-08-29 ENCOUNTER — Emergency Department (HOSPITAL_COMMUNITY)

## 2024-08-29 ENCOUNTER — Other Ambulatory Visit: Payer: Self-pay

## 2024-08-29 ENCOUNTER — Inpatient Hospital Stay (HOSPITAL_COMMUNITY)
Admission: EM | Admit: 2024-08-29 | Discharge: 2024-08-31 | DRG: 690 | Disposition: A | Discharge status: Home / Self Care | Attending: Internal Medicine | Admitting: Internal Medicine

## 2024-08-29 ENCOUNTER — Encounter (HOSPITAL_COMMUNITY): Payer: Self-pay

## 2024-08-29 DIAGNOSIS — N390 Urinary tract infection, site not specified: Principal | ICD-10-CM | POA: Diagnosis present

## 2024-08-29 DIAGNOSIS — Z825 Family history of asthma and other chronic lower respiratory diseases: Secondary | ICD-10-CM

## 2024-08-29 DIAGNOSIS — F17210 Nicotine dependence, cigarettes, uncomplicated: Secondary | ICD-10-CM | POA: Diagnosis present

## 2024-08-29 DIAGNOSIS — B9620 Unspecified Escherichia coli [E. coli] as the cause of diseases classified elsewhere: Secondary | ICD-10-CM | POA: Diagnosis present

## 2024-08-29 DIAGNOSIS — Z821 Family history of blindness and visual loss: Secondary | ICD-10-CM

## 2024-08-29 DIAGNOSIS — R109 Unspecified abdominal pain: Secondary | ICD-10-CM

## 2024-08-29 DIAGNOSIS — N83201 Unspecified ovarian cyst, right side: Secondary | ICD-10-CM | POA: Diagnosis present

## 2024-08-29 DIAGNOSIS — N739 Female pelvic inflammatory disease, unspecified: Secondary | ICD-10-CM

## 2024-08-29 DIAGNOSIS — N83209 Unspecified ovarian cyst, unspecified side: Secondary | ICD-10-CM

## 2024-08-29 DIAGNOSIS — Z809 Family history of malignant neoplasm, unspecified: Secondary | ICD-10-CM

## 2024-08-29 DIAGNOSIS — R1084 Generalized abdominal pain: Principal | ICD-10-CM

## 2024-08-29 DIAGNOSIS — Z813 Family history of other psychoactive substance abuse and dependence: Secondary | ICD-10-CM

## 2024-08-29 DIAGNOSIS — Z8249 Family history of ischemic heart disease and other diseases of the circulatory system: Secondary | ICD-10-CM

## 2024-08-29 DIAGNOSIS — Z833 Family history of diabetes mellitus: Secondary | ICD-10-CM

## 2024-08-29 DIAGNOSIS — Z555 Less than a high school diploma: Secondary | ICD-10-CM

## 2024-08-29 DIAGNOSIS — A5611 Chlamydial female pelvic inflammatory disease: Secondary | ICD-10-CM | POA: Diagnosis present

## 2024-08-29 DIAGNOSIS — Z888 Allergy status to other drugs, medicaments and biological substances status: Secondary | ICD-10-CM

## 2024-08-29 LAB — GC/CHLAMYDIA PROBE AMP (~~LOC~~) NOT AT ARMC
Chlamydia: POSITIVE — AB
Comment: NEGATIVE
Comment: NORMAL
Neisseria Gonorrhea: POSITIVE — AB

## 2024-08-29 LAB — CBC
HCT: 41.9 % (ref 36.0–46.0)
Hemoglobin: 13.9 g/dL (ref 12.0–15.0)
MCH: 31.3 pg (ref 26.0–34.0)
MCHC: 33.2 g/dL (ref 30.0–36.0)
MCV: 94.4 fL (ref 80.0–100.0)
Platelets: 346 K/uL (ref 150–400)
RBC: 4.44 MIL/uL (ref 3.87–5.11)
RDW: 12.7 % (ref 11.5–15.5)
WBC: 28.3 K/uL — ABNORMAL HIGH (ref 4.0–10.5)
nRBC: 0 % (ref 0.0–0.2)

## 2024-08-29 LAB — URINALYSIS, ROUTINE W REFLEX MICROSCOPIC
Bilirubin Urine: NEGATIVE
Glucose, UA: NEGATIVE mg/dL
Ketones, ur: 20 mg/dL — AB
Nitrite: NEGATIVE
Protein, ur: NEGATIVE mg/dL
Specific Gravity, Urine: 1.023 (ref 1.005–1.030)
pH: 5 (ref 5.0–8.0)

## 2024-08-29 LAB — WET PREP, GENITAL
Clue Cells Wet Prep HPF POC: NONE SEEN
Sperm: NONE SEEN
Trich, Wet Prep: NONE SEEN
WBC, Wet Prep HPF POC: >=10 — AB (ref <10)
Yeast Wet Prep HPF POC: NONE SEEN

## 2024-08-29 LAB — COMPREHENSIVE METABOLIC PANEL WITH GFR
ALT: 20 U/L (ref 0–44)
AST: 26 U/L (ref 15–41)
Albumin: 4 g/dL (ref 3.5–5.0)
Alkaline Phosphatase: 69 U/L (ref 38–126)
Anion gap: 12 (ref 5–15)
BUN: 9 mg/dL (ref 6–20)
CO2: 22 mmol/L (ref 22–32)
Calcium: 9.3 mg/dL (ref 8.9–10.3)
Chloride: 100 mmol/L (ref 98–111)
Creatinine, Ser: 0.77 mg/dL (ref 0.44–1.00)
GFR, Estimated: >60 mL/min (ref >60)
Glucose, Bld: 119 mg/dL — ABNORMAL HIGH (ref 70–99)
Potassium: 3.6 mmol/L (ref 3.5–5.1)
Sodium: 134 mmol/L — ABNORMAL LOW (ref 135–145)
Total Bilirubin: 1.4 mg/dL — ABNORMAL HIGH (ref 0.0–1.2)
Total Protein: 7.8 g/dL (ref 6.5–8.1)

## 2024-08-29 LAB — CBC WITH DIFFERENTIAL/PLATELET
Abs Immature Granulocytes: 0.15 K/uL — ABNORMAL HIGH (ref 0.00–0.07)
Basophils Absolute: 0.1 K/uL (ref 0.0–0.1)
Basophils Relative: 0 %
Eosinophils Absolute: 0.2 K/uL (ref 0.0–0.5)
Eosinophils Relative: 1 %
HCT: 40.1 % (ref 36.0–46.0)
Hemoglobin: 13.2 g/dL (ref 12.0–15.0)
Immature Granulocytes: 1 %
Lymphocytes Relative: 11 %
Lymphs Abs: 2.4 K/uL (ref 0.7–4.0)
MCH: 31.6 pg (ref 26.0–34.0)
MCHC: 32.9 g/dL (ref 30.0–36.0)
MCV: 95.9 fL (ref 80.0–100.0)
Monocytes Absolute: 1.4 K/uL — ABNORMAL HIGH (ref 0.1–1.0)
Monocytes Relative: 6 %
Neutro Abs: 18.1 K/uL — ABNORMAL HIGH (ref 1.7–7.7)
Neutrophils Relative %: 81 %
Platelets: 310 K/uL (ref 150–400)
RBC: 4.18 MIL/uL (ref 3.87–5.11)
RDW: 12.7 % (ref 11.5–15.5)
WBC: 22.3 K/uL — ABNORMAL HIGH (ref 4.0–10.5)
nRBC: 0 % (ref 0.0–0.2)

## 2024-08-29 LAB — HCG, SERUM, QUALITATIVE: Preg, Serum: NEGATIVE

## 2024-08-29 LAB — HIV ANTIBODY (ROUTINE TESTING W REFLEX): HIV Screen 4th Generation wRfx: NONREACTIVE

## 2024-08-29 LAB — LIPASE, BLOOD: Lipase: 13 U/L (ref 11–51)

## 2024-08-29 MED ORDER — ONDANSETRON HCL 4 MG/2ML IJ SOLN
4.0000 mg | Freq: Once | INTRAMUSCULAR | Status: AC
  Administered 2024-08-29: 4 mg via INTRAVENOUS
  Filled 2024-08-29: qty 2

## 2024-08-29 MED ORDER — ENOXAPARIN SODIUM 40 MG/0.4ML IJ SOSY
40.0000 mg | PREFILLED_SYRINGE | INTRAMUSCULAR | Status: DC
  Administered 2024-08-29: 40 mg via SUBCUTANEOUS
  Filled 2024-08-29 (×2): qty 0.4

## 2024-08-29 MED ORDER — DOXYCYCLINE HYCLATE 100 MG PO TABS
100.0000 mg | ORAL_TABLET | Freq: Two times a day (BID) | ORAL | Status: DC
  Administered 2024-08-29 – 2024-08-31 (×4): 100 mg via ORAL
  Filled 2024-08-29 (×4): qty 1

## 2024-08-29 MED ORDER — SODIUM CHLORIDE 0.9 % IV SOLN
1.0000 g | Freq: Once | INTRAVENOUS | Status: AC
  Administered 2024-08-29: 1 g via INTRAVENOUS
  Filled 2024-08-29: qty 10

## 2024-08-29 MED ORDER — OXYCODONE HCL 5 MG PO TABS
5.0000 mg | ORAL_TABLET | Freq: Four times a day (QID) | ORAL | Status: DC | PRN
  Administered 2024-08-29 – 2024-08-31 (×2): 5 mg via ORAL
  Filled 2024-08-29 (×2): qty 1

## 2024-08-29 MED ORDER — SODIUM CHLORIDE 0.9 % IV BOLUS
500.0000 mL | Freq: Once | INTRAVENOUS | Status: AC
  Administered 2024-08-29: 500 mL via INTRAVENOUS

## 2024-08-29 MED ORDER — ACETAMINOPHEN 500 MG PO TABS
1000.0000 mg | ORAL_TABLET | Freq: Four times a day (QID) | ORAL | Status: DC
Start: 2024-08-29 — End: 2024-08-31
  Administered 2024-08-29 – 2024-08-31 (×6): 1000 mg via ORAL
  Filled 2024-08-29 (×6): qty 2

## 2024-08-29 MED ORDER — FENTANYL CITRATE PF 50 MCG/ML IJ SOSY
50.0000 ug | PREFILLED_SYRINGE | Freq: Once | INTRAMUSCULAR | Status: AC
  Administered 2024-08-29: 50 ug via INTRAVENOUS
  Filled 2024-08-29: qty 1

## 2024-08-29 MED ORDER — METRONIDAZOLE 500 MG/100ML IV SOLN
500.0000 mg | Freq: Two times a day (BID) | INTRAVENOUS | Status: DC
Start: 2024-08-29 — End: 2024-09-12
  Administered 2024-08-29 – 2024-08-30 (×2): 500 mg via INTRAVENOUS
  Filled 2024-08-29 (×2): qty 100

## 2024-08-29 MED ORDER — IBUPROFEN 200 MG PO TABS
400.0000 mg | ORAL_TABLET | ORAL | Status: DC | PRN
  Administered 2024-08-31: 400 mg via ORAL
  Filled 2024-08-29: qty 2

## 2024-08-29 MED ORDER — MORPHINE SULFATE (PF) 4 MG/ML IV SOLN
4.0000 mg | Freq: Once | INTRAVENOUS | Status: AC
  Administered 2024-08-29: 4 mg via INTRAVENOUS
  Filled 2024-08-29: qty 1

## 2024-08-29 MED ORDER — SODIUM CHLORIDE 0.9 % IV SOLN
1.0000 g | INTRAVENOUS | Status: DC
  Administered 2024-08-30: 1 g via INTRAVENOUS
  Filled 2024-08-29: qty 10

## 2024-08-29 MED ORDER — IOHEXOL 350 MG/ML SOLN
75.0000 mL | Freq: Once | INTRAVENOUS | Status: AC | PRN
Start: 2024-08-29 — End: 2024-08-29
  Administered 2024-08-29: 75 mL via INTRAVENOUS

## 2024-08-29 MED ORDER — CEFTRIAXONE SODIUM 1 G IJ SOLR: 1.0000 g | INTRAMUSCULAR | Status: DC

## 2024-08-29 NOTE — ED Provider Notes (Signed)
  EMERGENCY DEPARTMENT AT [facility] Provider Note   CSN: [id number] Arrival date & time: 08/29/24  9761     Patient presents with: Abdominal Pain   Catherine Collins is a 28 y.o. female.   The history is provided by the patient.  Abdominal Pain Catherine Collins is a 28 y.o. female who presents to the Emergency Department complaining of abdominal pain. She presents the emergency department for evaluation of 2 to 3 days of lower abdominal/pelvic pain that is constant in nature. Pain is nonradiating. It is worse with movement. She has associated nausea but no vomiting. No diarrhea, dysuria, vaginally discharge. She did have some vaginally bleeding three days ago that she describes as old blood. Her last normal menstrual cycle was September 15. She has no known medical problems and takes no routine medications.     Prior to Admission medications   Medication Sig Start Date End Date Taking? Authorizing Provider  Blood Pressure Monitor KIT 1 kit by Does not apply route once a week. Check BP weekly. Large Cuff DX: Z34.6 Patient not taking: Reported on 01/01/2022 12/06/19   Wenzel, Julie N, PA-C  ibuprofen  (ADVIL ) 600 MG tablet Take 1 tablet (600 mg total) by mouth every 6 (six) hours as needed. 05/18/22   Elnor Bernarda SQUIBB, DO  Prenat-Fe Poly-Methfol-FA-DHA (VITAFOL  ULTRA) 29-0.6-0.4-200 MG CAPS Take 1 capsule by mouth daily before breakfast. 06/11/21   Rudy Carlin LABOR, MD    Allergies: Betamethasone     Review of Systems  Gastrointestinal:  Positive for abdominal pain.  All other systems reviewed and are negative.   Updated Vital Signs BP 113/82 (BP Location: Right Arm)   Pulse (!) 114   Temp 99.8 F (37.7 C) (Oral)   Resp 18   Ht 5' 2 (1.575 m)   Wt 50.8 kg   LMP 08/14/2024   SpO2 99%   BMI 20.49 kg/m   Physical Exam Vitals and nursing note reviewed.  Constitutional:      Appearance: She is well-developed.  HENT:     Head: Normocephalic and atraumatic.   Cardiovascular:     Rate and Rhythm: Regular rhythm. Tachycardia present.  Pulmonary:     Effort: Pulmonary effort is normal.     Breath sounds: Normal breath sounds.  Abdominal:     Palpations: Abdomen is soft.     Tenderness: There is no guarding or rebound.     Comments: Moderate lower suprapubic and RLQ tenderness  Genitourinary:    Comments: Moderate amount of blood in the vaginal vault. No CMT. Musculoskeletal:        General: No tenderness.  Skin:    General: Skin is warm and dry.  Neurological:     Mental Status: She is alert and oriented to person, place, and time.  Psychiatric:        Behavior: Behavior normal.     (all labs ordered are listed, but only abnormal results are displayed) Labs Reviewed  WET PREP, GENITAL - Abnormal; Notable for the following components:      Result Value   WBC, Wet Prep HPF POC >=10 (*)    All other components within normal limits  COMPREHENSIVE METABOLIC PANEL WITH GFR - Abnormal; Notable for the following components:   Sodium 134 (*)    Glucose, Bld 119 (*)    Total Bilirubin 1.4 (*)    All other components within normal limits  CBC - Abnormal; Notable for the following components:   WBC 28.3 (*)  All other components within normal limits  URINALYSIS, ROUTINE W REFLEX MICROSCOPIC - Abnormal; Notable for the following components:   Color, Urine AMBER (*)    APPearance HAZY (*)    Hgb urine dipstick LARGE (*)    Ketones, ur 20 (*)    Leukocytes,Ua MODERATE (*)    Bacteria, UA MANY (*)    All other components within normal limits  URINE CULTURE  LIPASE, BLOOD  HCG, SERUM, QUALITATIVE  GC/CHLAMYDIA PROBE AMP ([place]) NOT AT [facility]    EKG: None  Radiology: US  Pelvis Complete Result Date: 08/29/2024 CLINICAL DATA:  Pelvic pain with a known right ovarian dermoid. EXAM: TRANSABDOMINAL AND TRANSVAGINAL ULTRASOUND OF PELVIS DOPPLER ULTRASOUND OF OVARIES TECHNIQUE: Both transabdominal and transvaginal ultrasound examinations  of the pelvis were performed. Transabdominal technique was performed for global imaging of the pelvis including uterus, ovaries, adnexal regions, and pelvic cul-de-sac. It was necessary to proceed with endovaginal exam following the transabdominal exam to visualize the ovaries and adnexal structures better. Color and duplex Doppler ultrasound was utilized to evaluate blood flow to the ovaries. COMPARISON:  CT with IV contrast 08/22/2021. FINDINGS: Uterus Measurements: Anteverted measuring 8.7 x 3.7 x 4.1 cm = volume: 67.48 mL. No fibroids or other mass visualized. Endometrium Thickness: 4.1 mm.  No focal abnormality visualized. Right ovary Measurements: 4.3 x 4.3 x 4.8 cm = volume: 44.77 mL. There is an echogenic shadowing ovarian dermoid/cystic teratoma measuring 4.2 x 3.1 x 4.2 cm, on the 2022 CT was 4.9 x 3.0 x 3.5 cm and has probably not significantly changed. There is a 2.5 cm avascular hemorrhagic cyst. Left ovary Measurements: 3.6 x 2.3 x 3.2 cm = volume: 13.81 mL. Normal appearance/no adnexal mass. Pulsed Doppler evaluation of both ovaries demonstrates normal low-resistance arterial and venous waveforms. Other findings Trace free fluid is seen in the pelvic cul-de-sac. The fluid appears uncomplicated. IMPRESSION: 1. No evidence of ovarian torsion. 2. 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 3. 2.5 cm hemorrhagic cyst in the right ovary. 4. Normal appearance of the uterus and left ovary. 5. Trace cul-de-sac anechoic fluid. Electronically Signed   By: Francis Quam M.D.   On: 08/29/2024 05:09   US  Transvaginal Non-OB Result Date: 08/29/2024 CLINICAL DATA:  Pelvic pain with a known right ovarian dermoid. EXAM: TRANSABDOMINAL AND TRANSVAGINAL ULTRASOUND OF PELVIS DOPPLER ULTRASOUND OF OVARIES TECHNIQUE: Both transabdominal and transvaginal ultrasound examinations of the pelvis were performed. Transabdominal technique was  performed for global imaging of the pelvis including uterus, ovaries, adnexal regions, and pelvic cul-de-sac. It was necessary to proceed with endovaginal exam following the transabdominal exam to visualize the ovaries and adnexal structures better. Color and duplex Doppler ultrasound was utilized to evaluate blood flow to the ovaries. COMPARISON:  CT with IV contrast 08/22/2021. FINDINGS: Uterus Measurements: Anteverted measuring 8.7 x 3.7 x 4.1 cm = volume: 67.48 mL. No fibroids or other mass visualized. Endometrium Thickness: 4.1 mm.  No focal abnormality visualized. Right ovary Measurements: 4.3 x 4.3 x 4.8 cm = volume: 44.77 mL. There is an echogenic shadowing ovarian dermoid/cystic teratoma measuring 4.2 x 3.1 x 4.2 cm, on the 2022 CT was 4.9 x 3.0 x 3.5 cm and has probably not significantly changed. There is a 2.5 cm avascular hemorrhagic cyst. Left ovary Measurements: 3.6 x 2.3 x 3.2 cm = volume: 13.81 mL. Normal appearance/no adnexal mass. Pulsed Doppler evaluation of both ovaries demonstrates normal low-resistance arterial and venous  waveforms. Other findings Trace free fluid is seen in the pelvic cul-de-sac. The fluid appears uncomplicated. IMPRESSION: 1. No evidence of ovarian torsion. 2. 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 3. 2.5 cm hemorrhagic cyst in the right ovary. 4. Normal appearance of the uterus and left ovary. 5. Trace cul-de-sac anechoic fluid. Electronically Signed   By: Francis Quam M.D.   On: 08/29/2024 05:09   US  Art/Ven Flow Abd Pelv Doppler Result Date: 08/29/2024 CLINICAL DATA:  Pelvic pain with a known right ovarian dermoid. EXAM: TRANSABDOMINAL AND TRANSVAGINAL ULTRASOUND OF PELVIS DOPPLER ULTRASOUND OF OVARIES TECHNIQUE: Both transabdominal and transvaginal ultrasound examinations of the pelvis were performed. Transabdominal technique was performed for global imaging of the pelvis including  uterus, ovaries, adnexal regions, and pelvic cul-de-sac. It was necessary to proceed with endovaginal exam following the transabdominal exam to visualize the ovaries and adnexal structures better. Color and duplex Doppler ultrasound was utilized to evaluate blood flow to the ovaries. COMPARISON:  CT with IV contrast 08/22/2021. FINDINGS: Uterus Measurements: Anteverted measuring 8.7 x 3.7 x 4.1 cm = volume: 67.48 mL. No fibroids or other mass visualized. Endometrium Thickness: 4.1 mm.  No focal abnormality visualized. Right ovary Measurements: 4.3 x 4.3 x 4.8 cm = volume: 44.77 mL. There is an echogenic shadowing ovarian dermoid/cystic teratoma measuring 4.2 x 3.1 x 4.2 cm, on the 2022 CT was 4.9 x 3.0 x 3.5 cm and has probably not significantly changed. There is a 2.5 cm avascular hemorrhagic cyst. Left ovary Measurements: 3.6 x 2.3 x 3.2 cm = volume: 13.81 mL. Normal appearance/no adnexal mass. Pulsed Doppler evaluation of both ovaries demonstrates normal low-resistance arterial and venous waveforms. Other findings Trace free fluid is seen in the pelvic cul-de-sac. The fluid appears uncomplicated. IMPRESSION: 1. No evidence of ovarian torsion. 2. 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 3. 2.5 cm hemorrhagic cyst in the right ovary. 4. Normal appearance of the uterus and left ovary. 5. Trace cul-de-sac anechoic fluid. Electronically Signed   By: Francis Quam M.D.   On: 08/29/2024 05:09     Procedures   Medications Ordered in the ED  fentaNYL  (SUBLIMAZE ) injection 50 mcg (50 mcg Intravenous Given 08/29/24 0326)  sodium chloride  0.9 % bolus 500 mL (0 mLs Intravenous Stopped 08/29/24 0452)  ondansetron  (ZOFRAN ) injection 4 mg (4 mg Intravenous Given 08/29/24 0327)  cefTRIAXone  (ROCEPHIN ) 1 g in sodium chloride  0.9 % 100 mL IVPB (0 g Intravenous Stopped 08/29/24 0528)  fentaNYL  (SUBLIMAZE ) injection 50 mcg (50 mcg Intravenous  Given 08/29/24 0508)  ondansetron  (ZOFRAN ) injection 4 mg (4 mg Intravenous Given 08/29/24 0525)                                    Medical Decision Making Amount and/or Complexity of Data Reviewed Labs: ordered. Radiology: ordered.  Risk Prescription drug management.   Patient here for evaluation of two days of lower abdominal pain, reported fevers at home prior to arrival. CBC with leukocytosis. Pelvic examination does have blood present, no discharge, no CMT. UA is concerning for UTI and she is started on antibiotics. Pelvic ultrasound was obtained, which is negative for torsion but does demonstrate a right ovarian dermoid/cystic teratoma that is stable. Given patient's persistent pain plan to obtain CT abdomen pelvis to further  evaluate for additional process such as appendicitis. Patient care transferred pending CT abdomen pelvis.     Final diagnoses:  None    ED Discharge Orders     None          Griselda Norris, MD 08/29/24 [phone]
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
## 2024-08-29 NOTE — ED Notes (Signed)
Pt starting drinking oral contrast.

## 2024-08-29 NOTE — TOC CM/SW Note (Signed)
 Transition of Care [facility]) - Inpatient Brief Assessment   Patient Details  Name: Catherine Collins MRN: [id number] Date of Birth: 1996-02-20  Transition of Care [facility]) CM/SW Contact:    Lauraine FORBES Saa, LCSWA Phone Number: 08/29/2024, 4:01 PM   Clinical Narrative:  4:01 PM Per chart review, patient resides at home with significant other. Patient has a PCP and insurance. Patient does not have SNF/HH/DME history. Patient's preferred pharmacy is [facility] [phone] [place]. No TOC needs identified at this time. TOC will continue to follow and be available to assist.  Transition of Care Asessment: Insurance and Status: Insurance coverage has been reviewed Patient has primary care physician: Yes Home environment has been reviewed: Private Residence Prior level of function:: N/A Prior/Current Home Services: No current home services Social Drivers of Health Review: SDOH reviewed no interventions necessary Readmission risk has been reviewed: Yes (Currently Observation Status) Transition of care needs: no transition of care needs at this time

## 2024-08-29 NOTE — ED Triage Notes (Signed)
 Pt arrived from home via POV c/o lower abd pain 7/10 on pain scale. Pt denies N/V/D.

## 2024-08-29 NOTE — ED Notes (Signed)
 Pt transported upstairs with all belongings.

## 2024-08-29 NOTE — ED Provider Notes (Signed)
 Patient would benefit from admission.  Internal medicine teaching service is aware of case and evaluate for admission.  Patient understands plan of care and is agreeable with plan to admit.   Laurice Maude BROCKS, MD 08/29/24 [phone]

## 2024-08-29 NOTE — Hospital Course (Signed)
 Abd pain Aub ?uti Ovarian neoplasm

## 2024-08-29 NOTE — Progress Notes (Incomplete)
 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 Overnight Events: NAEON  Interim History:  Patient was resting in bed. She denied nausea, vaginal discharge, itching, burning, dysuria, or frequency. She continues to have abdominal pain, with one episode of vomiting and dizziness upon ambulation. Diagnosis and related instructions were discussed. She reports unprotected sexual intercourse 2 months ago but is not currently in a relationship. Patient was advised to notify her partner.  Vital Signs: Vitals:   08/29/24 1130 08/29/24 1300 08/29/24 1315 08/29/24 1428  BP: 113/73 115/66 107/62 100/83  Pulse: 78 66 77 64  Resp:   18 16  Temp:    98.2 F (36.8 C)  TempSrc:    Oral  SpO2: 100% 100% 100% 100%  Weight:      Height:       Supplemental O2: Room Air SpO2: 100 %  Filed Weights   08/29/24 0259  Weight: 50.8 kg     Intake/Output Summary (Last 24 hours) at 08/29/2024 1815 Last data filed at 08/29/2024 0528 Gross per 24 hour  Intake 594.06 ml  Output --  Net 594.06 ml   Net IO Since Admission: 594.06 mL [08/29/24 1815]  Physical Exam: General: Well-appearing, alert, in no acute distress. Cardiac: Normal rate, regular rhythm. Normal S1 and S2. No murmurs, rubs, or gallops.  Lungs:  Normal respiratory effort, no wheezes or crackles. Abdomen: Soft, non-distended. Tender to palpation in RLQ > LLQ. No masses or organomegaly. Bowel sounds active. Extremities: Warm, well-perfused, no cyanosis, or edema. Neurological: Alert and oriented 4. Normal speech Skin: Warm, dry, no rashes.  Patient Lines/Drains/Airways Status     Active Line/Drains/Airways     Name Placement date Placement time Site Days   Peripheral IV 08/29/24 20 G  Anterior;Left;Proximal Forearm 08/29/24  0325  Forearm  less than 1            Pertinent labs and imaging:      Latest Ref Rng & Units 08/29/2024    3:19 AM 12/06/2021    2:09 PM 10/16/2021    8:45 AM  CBC  WBC 4.0 - 10.5 K/uL 28.3  15.7  9.8   Hemoglobin 12.0 - 15.0 g/dL 86.0  9.8  88.8   Hematocrit 36.0 - 46.0 % 41.9  31.5  32.7   Platelets 150 - 400 K/uL 346  257  232        Latest Ref Rng & Units 08/29/2024    3:19 AM 08/22/2021   10:02 PM 03/13/2021    5:00 PM  CMP  Glucose 70 - 99 mg/dL 880  85  99   BUN 6 - 20 mg/dL 9  11  16    Creatinine 0.44 - 1.00 mg/dL 9.22  9.46  9.21   Sodium 135 - 145 mmol/L 134  135  142   Potassium 3.5 - 5.1 mmol/L 3.6  3.9  3.6   Chloride 98 - 111 mmol/L 100  105  109   CO2 22 - 32 mmol/L 22  24  25    Calcium 8.9 - 10.3 mg/dL 9.3  8.8  9.1   Total Protein 6.5 - 8.1 g/dL 7.8  6.3  7.3   Total Bilirubin 0.0 - 1.2 mg/dL 1.4  0.5  0.6   Alkaline Phos 38 - 126 U/L 69  48  53   AST 15 - 41 U/L 26  15  19    ALT 0 - 44 U/L 20  10  12      CT ABDOMEN PELVIS W CONTRAST Result Date: 08/29/2024 CLINICAL DATA:  Right lower quadrant abdominal pain. EXAM: CT ABDOMEN AND PELVIS WITH CONTRAST TECHNIQUE: Multidetector CT imaging of the abdomen and pelvis was performed using the standard protocol following bolus administration of intravenous contrast. RADIATION DOSE REDUCTION: This exam was performed according to the departmental dose-optimization program which includes automated exposure control, adjustment of the mA and/or kV according to patient size and/or use of iterative reconstruction technique. CONTRAST:  75mL OMNIPAQUE  IOHEXOL  350 MG/ML SOLN COMPARISON:  08/22/2021 FINDINGS: Lower chest: Mosaic attenuation in the medial left lower lobe may be atelectatic. Hepatobiliary: No suspicious focal abnormality within the liver parenchyma. There is no evidence for gallstones, gallbladder wall thickening, or pericholecystic fluid. No intrahepatic or extrahepatic  biliary dilation. Pancreas: No focal mass lesion. No dilatation of the main duct. No intraparenchymal cyst. No peripancreatic edema. Spleen: No splenomegaly. No suspicious focal mass lesion. Adrenals/Urinary Tract: No adrenal nodule or mass. Kidneys unremarkable. No evidence for hydroureter. The urinary bladder appears normal for the degree of distention. Stomach/Bowel: Stomach is unremarkable. No gastric wall thickening. No evidence of outlet obstruction. Duodenum is normally positioned as is the ligament of Treitz. No small bowel wall thickening. No small bowel dilatation. The appendix is normal. The terminal ileum is normal. No gross colonic mass. No colonic wall thickening. Vascular/Lymphatic: No abdominal aortic aneurysm. No abdominal aortic atherosclerotic calcification. There is no gastrohepatic or hepatoduodenal ligament lymphadenopathy. No retroperitoneal or mesenteric lymphadenopathy. Portal vein and superior mesenteric vein are patent. No pelvic sidewall lymphadenopathy. Reproductive: The uterus is unremarkable. Cervical anatomy is ill-defined. No left adnexal mass. 4.3 x 4.0 x 4.9 cm right adnexal mass contains macroscopic fat and soft tissue density consistent with ovarian dermoid. Other: Trace free fluid is seen in the cul-de-sac. Musculoskeletal: No worrisome lytic or sclerotic osseous abnormality. IMPRESSION: 1. 4.3 x 4.0 x 4.9 cm right adnexal mass contains macroscopic fat and soft tissue density consistent with ovarian dermoid. This was evaluated by ultrasound immediately prior to this CT scan. 2. Trace free fluid in the cul-de-sac. 3. Normal terminal ileum and appendix. Electronically Signed   By: Camellia Candle M.D.   On: 08/29/2024 07:59   US  Pelvis Complete Result Date: 08/29/2024 CLINICAL DATA:  Pelvic pain with a known right ovarian dermoid. EXAM: TRANSABDOMINAL AND TRANSVAGINAL ULTRASOUND OF PELVIS DOPPLER ULTRASOUND OF OVARIES TECHNIQUE: Both transabdominal and transvaginal ultrasound  examinations of the pelvis were performed. Transabdominal technique was performed for global imaging of the pelvis including uterus, ovaries, adnexal regions, and pelvic cul-de-sac. It was necessary to proceed with endovaginal exam following the transabdominal exam to visualize the ovaries and adnexal structures better. Color and duplex Doppler ultrasound was utilized to evaluate blood flow to the ovaries. COMPARISON:  CT with IV contrast 08/22/2021. FINDINGS: Uterus Measurements: Anteverted measuring 8.7 x 3.7 x 4.1 cm = volume: 67.48 mL. No fibroids or other mass visualized. Endometrium Thickness: 4.1 mm.  No focal abnormality visualized. Right ovary Measurements: 4.3 x 4.3 x 4.8 cm = volume: 44.77 mL. There is an echogenic shadowing ovarian dermoid/cystic teratoma measuring 4.2 x 3.1 x 4.2 cm, on the 2022 CT was 4.9 x 3.0 x 3.5 cm and has probably not significantly changed. There is a 2.5 cm avascular hemorrhagic cyst. Left ovary Measurements: 3.6 x 2.3 x 3.2 cm = volume: 13.81 mL.  Normal appearance/no adnexal mass. Pulsed Doppler evaluation of both ovaries demonstrates normal low-resistance arterial and venous waveforms. Other findings Trace free fluid is seen in the pelvic cul-de-sac. The fluid appears uncomplicated. IMPRESSION: 1. No evidence of ovarian torsion. 2. 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 3. 2.5 cm hemorrhagic cyst in the right ovary. 4. Normal appearance of the uterus and left ovary. 5. Trace cul-de-sac anechoic fluid. Electronically Signed   By: Francis Quam M.D.   On: 08/29/2024 05:09   US  Transvaginal Non-OB Result Date: 08/29/2024 CLINICAL DATA:  Pelvic pain with a known right ovarian dermoid. EXAM: TRANSABDOMINAL AND TRANSVAGINAL ULTRASOUND OF PELVIS DOPPLER ULTRASOUND OF OVARIES TECHNIQUE: Both transabdominal and transvaginal ultrasound examinations of the pelvis were performed. Transabdominal technique  was performed for global imaging of the pelvis including uterus, ovaries, adnexal regions, and pelvic cul-de-sac. It was necessary to proceed with endovaginal exam following the transabdominal exam to visualize the ovaries and adnexal structures better. Color and duplex Doppler ultrasound was utilized to evaluate blood flow to the ovaries. COMPARISON:  CT with IV contrast 08/22/2021. FINDINGS: Uterus Measurements: Anteverted measuring 8.7 x 3.7 x 4.1 cm = volume: 67.48 mL. No fibroids or other mass visualized. Endometrium Thickness: 4.1 mm.  No focal abnormality visualized. Right ovary Measurements: 4.3 x 4.3 x 4.8 cm = volume: 44.77 mL. There is an echogenic shadowing ovarian dermoid/cystic teratoma measuring 4.2 x 3.1 x 4.2 cm, on the 2022 CT was 4.9 x 3.0 x 3.5 cm and has probably not significantly changed. There is a 2.5 cm avascular hemorrhagic cyst. Left ovary Measurements: 3.6 x 2.3 x 3.2 cm = volume: 13.81 mL. Normal appearance/no adnexal mass. Pulsed Doppler evaluation of both ovaries demonstrates normal low-resistance arterial and venous waveforms. Other findings Trace free fluid is seen in the pelvic cul-de-sac. The fluid appears uncomplicated. IMPRESSION: 1. No evidence of ovarian torsion. 2. 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 3. 2.5 cm hemorrhagic cyst in the right ovary. 4. Normal appearance of the uterus and left ovary. 5. Trace cul-de-sac anechoic fluid. Electronically Signed   By: Francis Quam M.D.   On: 08/29/2024 05:09   US  Art/Ven Flow Abd Pelv Doppler Result Date: 08/29/2024 CLINICAL DATA:  Pelvic pain with a known right ovarian dermoid. EXAM: TRANSABDOMINAL AND TRANSVAGINAL ULTRASOUND OF PELVIS DOPPLER ULTRASOUND OF OVARIES TECHNIQUE: Both transabdominal and transvaginal ultrasound examinations of the pelvis were performed. Transabdominal technique was performed for global imaging of the pelvis including  uterus, ovaries, adnexal regions, and pelvic cul-de-sac. It was necessary to proceed with endovaginal exam following the transabdominal exam to visualize the ovaries and adnexal structures better. Color and duplex Doppler ultrasound was utilized to evaluate blood flow to the ovaries. COMPARISON:  CT with IV contrast 08/22/2021. FINDINGS: Uterus Measurements: Anteverted measuring 8.7 x 3.7 x 4.1 cm = volume: 67.48 mL. No fibroids or other mass visualized. Endometrium Thickness: 4.1 mm.  No focal abnormality visualized. Right ovary Measurements: 4.3 x 4.3 x 4.8 cm = volume: 44.77 mL. There is an echogenic shadowing ovarian dermoid/cystic teratoma measuring 4.2 x 3.1 x 4.2 cm, on the 2022 CT was 4.9 x 3.0 x 3.5 cm and has probably not significantly changed. There is a 2.5 cm avascular hemorrhagic cyst. Left ovary Measurements: 3.6 x 2.3 x 3.2 cm = volume: 13.81 mL. Normal appearance/no adnexal mass. Pulsed Doppler evaluation of both  ovaries demonstrates normal low-resistance arterial and venous waveforms. Other findings Trace free fluid is seen in the pelvic cul-de-sac. The fluid appears uncomplicated. IMPRESSION: 1. No evidence of ovarian torsion. 2. 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 3. 2.5 cm hemorrhagic cyst in the right ovary. 4. Normal appearance of the uterus and left ovary. 5. Trace cul-de-sac anechoic fluid. Electronically Signed   By: Francis Quam M.D.   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  # Pelvic Inflammatory Disease (PID) Patient with  recent unprotected intercourse, history of fever, abdominal pain, and abnormal uterine bleeding. Presents with 3 days of severe lower abdominal pain (RLQ > LLQ, radiating to right flank), intermenstrual bleeding, and subjective fever. Exam notable for cervical motion tenderness and blood in vaginal vault; imaging shows trace free fluid in cul-de-sac. Labs reveal leukocytosis (WBC 22 ? 13). GC/chlamydia positive, supporting diagnosis of PID likely due to ascending infection. Clinical picture consistent with systemic inflammatory response. Important to monitor for complications such as tubo-ovarian abscess or chronic pelvic pain. Plan: - Ceftriaxone  1 g daily x 2 days/ discontinued today - Continue doxycycline 100 mg BID  14 days (currently day 2) - Discontinue IV metronidazole ; switch to PO metronidazole  500 mg BID - Tylenol  scheduled; oxycodone  and ibuprofen  PRN - GYN follow-up - Notify local health department  # Ovarian Cysts Known right ovarian dermoid cyst (stable on US ) and new right hemorrhagic ovarian cyst. These may contribute to localized pain and intermenstrual bleeding, though not the systemic leukocytosis or severity of symptoms. Both expected to resolve without acute intervention. Plan: - Outpatient follow-up  # Urinary Tract Infection (UTI) UA with bacteria and WBCs, consistent with UTI. CT negative for stones or pyelonephritis. UTI may contribute to dysuria but not primary cause of severe abdominal pain/systemic findings. Current PID regimen provides coverage for UTI as well. Plan: - Continue antibiotics as above (dual coverage for PID/UTI) - Monitor symptoms and urine studies as needed  Best Practice: Diet: Regular diet IVF: N/AVTE: Lovenox Code: Full  Signature:  Laterria Lasota [facility]  6:15 PM, 08/29/2024  On Call pager [phone]

## 2024-08-30 DIAGNOSIS — R1031 Right lower quadrant pain: Secondary | ICD-10-CM | POA: Diagnosis present

## 2024-08-30 DIAGNOSIS — Z813 Family history of other psychoactive substance abuse and dependence: Secondary | ICD-10-CM | POA: Diagnosis not present

## 2024-08-30 DIAGNOSIS — N3001 Acute cystitis with hematuria: Secondary | ICD-10-CM

## 2024-08-30 DIAGNOSIS — N390 Urinary tract infection, site not specified: Secondary | ICD-10-CM | POA: Diagnosis present

## 2024-08-30 DIAGNOSIS — Z821 Family history of blindness and visual loss: Secondary | ICD-10-CM | POA: Diagnosis not present

## 2024-08-30 DIAGNOSIS — N739 Female pelvic inflammatory disease, unspecified: Secondary | ICD-10-CM

## 2024-08-30 DIAGNOSIS — Z888 Allergy status to other drugs, medicaments and biological substances status: Secondary | ICD-10-CM | POA: Diagnosis not present

## 2024-08-30 DIAGNOSIS — A5611 Chlamydial female pelvic inflammatory disease: Secondary | ICD-10-CM | POA: Diagnosis present

## 2024-08-30 DIAGNOSIS — Z825 Family history of asthma and other chronic lower respiratory diseases: Secondary | ICD-10-CM | POA: Diagnosis not present

## 2024-08-30 DIAGNOSIS — B9620 Unspecified Escherichia coli [E. coli] as the cause of diseases classified elsewhere: Secondary | ICD-10-CM | POA: Diagnosis present

## 2024-08-30 DIAGNOSIS — Z809 Family history of malignant neoplasm, unspecified: Secondary | ICD-10-CM | POA: Diagnosis not present

## 2024-08-30 DIAGNOSIS — Z8249 Family history of ischemic heart disease and other diseases of the circulatory system: Secondary | ICD-10-CM | POA: Diagnosis not present

## 2024-08-30 DIAGNOSIS — R1084 Generalized abdominal pain: Secondary | ICD-10-CM | POA: Diagnosis not present

## 2024-08-30 DIAGNOSIS — Z555 Less than a high school diploma: Secondary | ICD-10-CM | POA: Diagnosis not present

## 2024-08-30 DIAGNOSIS — N83201 Unspecified ovarian cyst, right side: Secondary | ICD-10-CM | POA: Diagnosis present

## 2024-08-30 DIAGNOSIS — F17210 Nicotine dependence, cigarettes, uncomplicated: Secondary | ICD-10-CM | POA: Diagnosis present

## 2024-08-30 DIAGNOSIS — Z833 Family history of diabetes mellitus: Secondary | ICD-10-CM | POA: Diagnosis not present

## 2024-08-30 LAB — BASIC METABOLIC PANEL WITH GFR
Anion gap: 11 (ref 5–15)
BUN: 14 mg/dL (ref 6–20)
CO2: 25 mmol/L (ref 22–32)
Calcium: 8.9 mg/dL (ref 8.9–10.3)
Chloride: 99 mmol/L (ref 98–111)
Creatinine, Ser: 0.68 mg/dL (ref 0.44–1.00)
GFR, Estimated: >60 mL/min (ref >60)
Glucose, Bld: 108 mg/dL — ABNORMAL HIGH (ref 70–99)
Potassium: 3.7 mmol/L (ref 3.5–5.1)
Sodium: 135 mmol/L (ref 135–145)

## 2024-08-30 LAB — CBC
HCT: 39.3 % (ref 36.0–46.0)
Hemoglobin: 13.1 g/dL (ref 12.0–15.0)
MCH: 31.8 pg (ref 26.0–34.0)
MCHC: 33.3 g/dL (ref 30.0–36.0)
MCV: 95.4 fL (ref 80.0–100.0)
Platelets: 332 K/uL (ref 150–400)
RBC: 4.12 MIL/uL (ref 3.87–5.11)
RDW: 12.7 % (ref 11.5–15.5)
WBC: 13.1 K/uL — ABNORMAL HIGH (ref 4.0–10.5)
nRBC: 0 % (ref 0.0–0.2)

## 2024-08-30 MED ORDER — ONDANSETRON 4 MG PO TBDP
4.0000 mg | ORAL_TABLET | Freq: Three times a day (TID) | ORAL | Status: DC | PRN
Start: 2024-08-30 — End: 2024-08-31
  Administered 2024-08-30: 4 mg via ORAL

## 2024-08-30 MED ORDER — ONDANSETRON 4 MG PO TBDP
4.0000 mg | ORAL_TABLET | Freq: Three times a day (TID) | ORAL | Status: DC | PRN
  Filled 2024-08-30: qty 1

## 2024-08-30 MED ORDER — SODIUM CHLORIDE 0.9 % IV SOLN
1.0000 g | INTRAVENOUS | Status: AC
  Administered 2024-08-31: 1 g via INTRAVENOUS
  Filled 2024-08-30: qty 10

## 2024-08-30 MED ORDER — METRONIDAZOLE 500 MG PO TABS
500.0000 mg | ORAL_TABLET | Freq: Two times a day (BID) | ORAL | Status: DC
Start: 2024-08-30 — End: 2024-09-12
  Administered 2024-08-30 – 2024-08-31 (×3): 500 mg via ORAL
  Filled 2024-08-30 (×3): qty 1

## 2024-08-30 NOTE — Plan of Care (Signed)

## 2024-08-31 ENCOUNTER — Other Ambulatory Visit (HOSPITAL_COMMUNITY): Payer: Self-pay

## 2024-08-31 DIAGNOSIS — N3001 Acute cystitis with hematuria: Secondary | ICD-10-CM | POA: Diagnosis not present

## 2024-08-31 DIAGNOSIS — N739 Female pelvic inflammatory disease, unspecified: Secondary | ICD-10-CM | POA: Diagnosis not present

## 2024-08-31 DIAGNOSIS — R1084 Generalized abdominal pain: Secondary | ICD-10-CM | POA: Diagnosis not present

## 2024-08-31 LAB — COMPREHENSIVE METABOLIC PANEL WITH GFR
ALT: 17 U/L (ref 0–44)
AST: 17 U/L (ref 15–41)
Albumin: 3.1 g/dL — ABNORMAL LOW (ref 3.5–5.0)
Alkaline Phosphatase: 62 U/L (ref 38–126)
Anion gap: 9 (ref 5–15)
BUN: 15 mg/dL (ref 6–20)
CO2: 23 mmol/L (ref 22–32)
Calcium: 8.7 mg/dL — ABNORMAL LOW (ref 8.9–10.3)
Chloride: 105 mmol/L (ref 98–111)
Creatinine, Ser: 0.73 mg/dL (ref 0.44–1.00)
GFR, Estimated: >60 mL/min (ref >60)
Glucose, Bld: 125 mg/dL — ABNORMAL HIGH (ref 70–99)
Potassium: 3.4 mmol/L — ABNORMAL LOW (ref 3.5–5.1)
Sodium: 137 mmol/L (ref 135–145)
Total Bilirubin: 0.7 mg/dL (ref 0.0–1.2)
Total Protein: 6.5 g/dL (ref 6.5–8.1)

## 2024-08-31 LAB — URINE CULTURE: Culture: >=100000 — AB

## 2024-08-31 LAB — CBC
HCT: 38.4 % (ref 36.0–46.0)
Hemoglobin: 12.5 g/dL (ref 12.0–15.0)
MCH: 31.3 pg (ref 26.0–34.0)
MCHC: 32.6 g/dL (ref 30.0–36.0)
MCV: 96 fL (ref 80.0–100.0)
Platelets: 318 K/uL (ref 150–400)
RBC: 4 MIL/uL (ref 3.87–5.11)
RDW: 12.5 % (ref 11.5–15.5)
WBC: 7.6 K/uL (ref 4.0–10.5)
nRBC: 0 % (ref 0.0–0.2)

## 2024-08-31 MED ORDER — METRONIDAZOLE 500 MG PO TABS
500.0000 mg | ORAL_TABLET | Freq: Two times a day (BID) | ORAL | 0 refills | Status: AC
  Filled 2024-08-31: qty 23, 12d supply, fill #0

## 2024-08-31 MED ORDER — DOXYCYCLINE HYCLATE 100 MG PO TABS
100.0000 mg | ORAL_TABLET | Freq: Two times a day (BID) | ORAL | 0 refills | Status: AC
  Filled 2024-08-31: qty 23, 12d supply, fill #0

## 2024-08-31 MED ORDER — ONDANSETRON 4 MG PO TBDP
4.0000 mg | ORAL_TABLET | Freq: Three times a day (TID) | ORAL | 0 refills | Status: AC | PRN
  Filled 2024-08-31: qty 20, 7d supply, fill #0

## 2024-08-31 NOTE — Discharge Summary (Signed)
 Name: Catherine Collins MRN: [id number] DOB: May 09, 1996 28 y.o. PCP: Center, [facility]  Date of Admission: 08/29/2024  2:48 AM Date of Discharge: 08/31/2024 Attending Physician: Dr. MICAEL Riis Winfrey  Discharge Diagnosis: 1. Principal Problem:   UTI (urinary tract infection) Active Problems:   Abdominal pain   Ovarian cyst   Female pelvic inflammatory disease    Discharge Medications: Allergies as of 08/31/2024       Reactions   Betamethasone  Rash        Medication List     TAKE these medications    Blood Pressure Monitor Kit 1 kit by Does not apply route once a week. Check BP weekly. Large Cuff DX: Z34.6   doxycycline 100 MG tablet Commonly known as: VIBRA-TABS Take 1 tablet (100 mg total) by mouth 2 (two) times daily for 23 doses.   metroNIDAZOLE  500 MG tablet Commonly known as: FLAGYL  Take 1 tablet (500 mg total) by mouth 2 (two) times daily for 23 doses.   ondansetron  4 MG disintegrating tablet Commonly known as: ZOFRAN -ODT Take 1 tablet (4 mg total) by mouth every 8 (eight) hours as needed for nausea or vomiting.        Disposition and follow-up:   28 year old [id number] female with 3 days of severe RLQ abdominal pain radiating to LLQ/right flank, intermenstrual bleeding, and fever, found to have leukocytosis, trace pelvic free fluid, admitted for PID.   # Pelvic Inflammatory Disease (PID) Confirm adherence to antibiotics (doxycycline, metronidazole ) for total 14 days. Monitor for persistent pelvic/abdominal pain, fever, or discharge. Ensure referral and follow-up with outpatient OB/GYN for re-evaluation and ongoing care. Counsel regarding partner notification and treatment.  # Ovarian Cysts Known right dermoid and hemorrhagic cyst. No acute intervention during admission; outpatient OB/GYN follow-up recommended. Monitor for worsening pelvic pain or abnormal bleeding.  # Urinary Tract Infection (UTI) Covered by current antibiotic  regimen. Monitor for recurrence (dysuria, frequency, fever). Repeat UA/culture if symptoms recur.  Labs/Imaging needed at follow-up:U/A, CBC  Please: - Ensure completion of antibiotics. - Reassess symptoms and recovery. - Coordinate outpatient OB/GYN referral. - Counsel on smoking/alcohol and sexual health safety.   Follow-up Appointments:  Follow-up Information     Center, [facility]. Call today.   Why: Hospital Follow-up Contact information: [address] [place] [address] [phone]                  Hospital Course by problem list: Catherine Collins is a 28 y.o. person who presented with abdominal pain and fever and admitted for PID  now being discharged on hospital day 1 with the following pertinent hospital course:  #Pelvic Inflammatory Disease (PID) [name] presented with 3 days of severe lower abdominal pain (RLQ > LLQ, radiating to right flank), intermenstrual bleeding, and subjective fever. Labs showed leukocytosis (WBC: 28), and GC/chlamydia testing returned positive, confirming PID likely due to ascending infection. Imaging revealed trace free fluid in the pelvis but no abscess. She was on IV ceftriaxone  which was discontinued after 3 days and treated with doxycycline 100 mg BID and metronidazole  500 mg BID for a 14-day course. Pain was managed with scheduled acetaminophen  and PRN ibuprofen /oxycodone . The patient was educated on STI precautions and partner notification, and outpatient OB/GYN follow-up was arranged. She remained hemodynamically stable and improved clinically over her stay. Discharge Medications: - Doxycycline 100 mg PO BID  12 more days - Metronidazole  500 mg PO BID  12 more days  #Ovarian Cysts Imaging identified a right ovarian  dermoid cyst (stable) and a new right hemorrhagic cyst, likely contributing to localized pain and intermenstrual bleeding but not explaining systemic leukocytosis. These cysts were monitored  conservatively, with expectation of spontaneous resolution. The patient was advised to follow up with OB/GYN for monitoring and reassessment.  #Urinary Tract Infection (UTI) UA revealed bacteria and WBCs consistent with a urinary tract infection. CT imaging excluded stones or pyelonephritis. IV antibiotics initiated in the ED provided initial coverage, and the PID oral regimen continued to provide dual coverage for UTI. The patient remained clinically stable, afebrile, and without dysuria during the hospitalization. Outpatient monitoring of urinary symptoms and cultures was advise   Subjective The patient is doing better, denies nausea, vomiting, fever, or chills. Bowel movements are normal. She reports mild dizziness but is otherwise completely stable.  Physical exam: BP 130/78   Pulse (!) 59   Temp 98.2 F (36.8 C) (Oral)   Resp 16   Ht 5' 2 (1.575 m)   Wt 50.8 kg   LMP 08/14/2024   SpO2 100%   BMI 20.49 kg/m  General: Well-appearing, alert, in no acute distress. Cardiac: Normal rate, regular rhythm. Normal S1 and S2. No murmurs, rubs, or gallops.  Lungs:  Normal respiratory effort, no wheezes or crackles. Abdomen: Soft, non-distended with no tenderness. Extremities: Warm, well-perfused, no cyanosis, or edema. Neurological: Alert and oriented 4. Normal speech Skin: Warm, dry, no rashes. Pertinent Labs, Studies, and Procedures:     Latest Ref Rng & Units 08/31/2024    5:21 AM 08/30/2024    5:27 AM 08/29/2024    6:58 PM  CBC  WBC 4.0 - 10.5 K/uL 7.6  13.1  22.3   Hemoglobin 12.0 - 15.0 g/dL 87.4  86.8  86.7   Hematocrit 36.0 - 46.0 % 38.4  39.3  40.1   Platelets 150 - 400 K/uL 318  332  310        Latest Ref Rng & Units 08/31/2024    5:21 AM 08/30/2024    5:27 AM 08/29/2024    3:19 AM  CMP  Glucose 70 - 99 mg/dL 874  891  880   BUN 6 - 20 mg/dL 15  14  9    Creatinine 0.44 - 1.00 mg/dL 9.26  9.31  9.22   Sodium 135 - 145 mmol/L 137  135  134   Potassium 3.5 - 5.1 mmol/L  3.4  3.7  3.6   Chloride 98 - 111 mmol/L 105  99  100   CO2 22 - 32 mmol/L 23  25  22    Calcium 8.9 - 10.3 mg/dL 8.7  8.9  9.3   Total Protein 6.5 - 8.1 g/dL 6.5   7.8   Total Bilirubin 0.0 - 1.2 mg/dL 0.7   1.4   Alkaline Phos 38 - 126 U/L 62   69   AST 15 - 41 U/L 17   26   ALT 0 - 44 U/L 17   20     CT ABDOMEN PELVIS W CONTRAST Result Date: 08/29/2024 CLINICAL DATA:  Right lower quadrant abdominal pain. EXAM: CT ABDOMEN AND PELVIS WITH CONTRAST TECHNIQUE: Multidetector CT imaging of the abdomen and pelvis was performed using the standard protocol following bolus administration of intravenous contrast. RADIATION DOSE REDUCTION: This exam was performed according to the departmental dose-optimization program which includes automated exposure control, adjustment of the mA and/or kV according to patient size and/or use of iterative reconstruction technique. CONTRAST:  75mL OMNIPAQUE  IOHEXOL  350 MG/ML SOLN COMPARISON:  08/22/2021 FINDINGS: Lower chest: Mosaic attenuation in the medial left lower lobe may be atelectatic. Hepatobiliary: No suspicious focal abnormality within the liver parenchyma. There is no evidence for gallstones, gallbladder wall thickening, or pericholecystic fluid. No intrahepatic or extrahepatic biliary dilation. Pancreas: No focal mass lesion. No dilatation of the main duct. No intraparenchymal cyst. No peripancreatic edema. Spleen: No splenomegaly. No suspicious focal mass lesion. Adrenals/Urinary Tract: No adrenal nodule or mass. Kidneys unremarkable. No evidence for hydroureter. The urinary bladder appears normal for the degree of distention. Stomach/Bowel: Stomach is unremarkable. No gastric wall thickening. No evidence of outlet obstruction. Duodenum is normally positioned as is the ligament of Treitz. No small bowel wall thickening. No small bowel dilatation. The appendix is normal. The terminal ileum is normal. No gross colonic mass. No colonic wall thickening.  Vascular/Lymphatic: No abdominal aortic aneurysm. No abdominal aortic atherosclerotic calcification. There is no gastrohepatic or hepatoduodenal ligament lymphadenopathy. No retroperitoneal or mesenteric lymphadenopathy. Portal vein and superior mesenteric vein are patent. No pelvic sidewall lymphadenopathy. Reproductive: The uterus is unremarkable. Cervical anatomy is ill-defined. No left adnexal mass. 4.3 x 4.0 x 4.9 cm right adnexal mass contains macroscopic fat and soft tissue density consistent with ovarian dermoid. Other: Trace free fluid is seen in the cul-de-sac. Musculoskeletal: No worrisome lytic or sclerotic osseous abnormality. IMPRESSION: 1. 4.3 x 4.0 x 4.9 cm right adnexal mass contains macroscopic fat and soft tissue density consistent with ovarian dermoid. This was evaluated by ultrasound immediately prior to this CT scan. 2. Trace free fluid in the cul-de-sac. 3. Normal terminal ileum and appendix. Electronically Signed   By: Camellia Candle M.D.   On: 08/29/2024 07:59   US  Pelvis Complete Result Date: 08/29/2024 CLINICAL DATA:  Pelvic pain with a known right ovarian dermoid. EXAM: TRANSABDOMINAL AND TRANSVAGINAL ULTRASOUND OF PELVIS DOPPLER ULTRASOUND OF OVARIES TECHNIQUE: Both transabdominal and transvaginal ultrasound examinations of the pelvis were performed. Transabdominal technique was performed for global imaging of the pelvis including uterus, ovaries, adnexal regions, and pelvic cul-de-sac. It was necessary to proceed with endovaginal exam following the transabdominal exam to visualize the ovaries and adnexal structures better. Color and duplex Doppler ultrasound was utilized to evaluate blood flow to the ovaries. COMPARISON:  CT with IV contrast 08/22/2021. FINDINGS: Uterus Measurements: Anteverted measuring 8.7 x 3.7 x 4.1 cm = volume: 67.48 mL. No fibroids or other mass visualized. Endometrium Thickness: 4.1 mm.  No focal abnormality visualized. Right ovary Measurements: 4.3 x 4.3 x 4.8  cm = volume: 44.77 mL. There is an echogenic shadowing ovarian dermoid/cystic teratoma measuring 4.2 x 3.1 x 4.2 cm, on the 2022 CT was 4.9 x 3.0 x 3.5 cm and has probably not significantly changed. There is a 2.5 cm avascular hemorrhagic cyst. Left ovary Measurements: 3.6 x 2.3 x 3.2 cm = volume: 13.81 mL. Normal appearance/no adnexal mass. Pulsed Doppler evaluation of both ovaries demonstrates normal low-resistance arterial and venous waveforms. Other findings Trace free fluid is seen in the pelvic cul-de-sac. The fluid appears uncomplicated. IMPRESSION: 1. No evidence of ovarian torsion. 2. 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 3. 2.5 cm hemorrhagic cyst in the right ovary. 4. Normal appearance of the uterus and left ovary. 5. Trace cul-de-sac anechoic fluid. Electronically Signed   By: Francis Quam M.D.   On: 08/29/2024 05:09   US  Transvaginal Non-OB Result Date: 08/29/2024 CLINICAL DATA:  Pelvic pain with a known right ovarian dermoid. EXAM: TRANSABDOMINAL  AND TRANSVAGINAL ULTRASOUND OF PELVIS DOPPLER ULTRASOUND OF OVARIES TECHNIQUE: Both transabdominal and transvaginal ultrasound examinations of the pelvis were performed. Transabdominal technique was performed for global imaging of the pelvis including uterus, ovaries, adnexal regions, and pelvic cul-de-sac. It was necessary to proceed with endovaginal exam following the transabdominal exam to visualize the ovaries and adnexal structures better. Color and duplex Doppler ultrasound was utilized to evaluate blood flow to the ovaries. COMPARISON:  CT with IV contrast 08/22/2021. FINDINGS: Uterus Measurements: Anteverted measuring 8.7 x 3.7 x 4.1 cm = volume: 67.48 mL. No fibroids or other mass visualized. Endometrium Thickness: 4.1 mm.  No focal abnormality visualized. Right ovary Measurements: 4.3 x 4.3 x 4.8 cm = volume: 44.77 mL. There is an echogenic shadowing ovarian  dermoid/cystic teratoma measuring 4.2 x 3.1 x 4.2 cm, on the 2022 CT was 4.9 x 3.0 x 3.5 cm and has probably not significantly changed. There is a 2.5 cm avascular hemorrhagic cyst. Left ovary Measurements: 3.6 x 2.3 x 3.2 cm = volume: 13.81 mL. Normal appearance/no adnexal mass. Pulsed Doppler evaluation of both ovaries demonstrates normal low-resistance arterial and venous waveforms. Other findings Trace free fluid is seen in the pelvic cul-de-sac. The fluid appears uncomplicated. IMPRESSION: 1. No evidence of ovarian torsion. 2. 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 3. 2.5 cm hemorrhagic cyst in the right ovary. 4. Normal appearance of the uterus and left ovary. 5. Trace cul-de-sac anechoic fluid. Electronically Signed   By: Francis Quam M.D.   On: 08/29/2024 05:09   US  Art/Ven Flow Abd Pelv Doppler Result Date: 08/29/2024 CLINICAL DATA:  Pelvic pain with a known right ovarian dermoid. EXAM: TRANSABDOMINAL AND TRANSVAGINAL ULTRASOUND OF PELVIS DOPPLER ULTRASOUND OF OVARIES TECHNIQUE: Both transabdominal and transvaginal ultrasound examinations of the pelvis were performed. Transabdominal technique was performed for global imaging of the pelvis including uterus, ovaries, adnexal regions, and pelvic cul-de-sac. It was necessary to proceed with endovaginal exam following the transabdominal exam to visualize the ovaries and adnexal structures better. Color and duplex Doppler ultrasound was utilized to evaluate blood flow to the ovaries. COMPARISON:  CT with IV contrast 08/22/2021. FINDINGS: Uterus Measurements: Anteverted measuring 8.7 x 3.7 x 4.1 cm = volume: 67.48 mL. No fibroids or other mass visualized. Endometrium Thickness: 4.1 mm.  No focal abnormality visualized. Right ovary Measurements: 4.3 x 4.3 x 4.8 cm = volume: 44.77 mL. There is an echogenic shadowing ovarian dermoid/cystic teratoma measuring 4.2 x 3.1 x 4.2 cm, on  the 2022 CT was 4.9 x 3.0 x 3.5 cm and has probably not significantly changed. There is a 2.5 cm avascular hemorrhagic cyst. Left ovary Measurements: 3.6 x 2.3 x 3.2 cm = volume: 13.81 mL. Normal appearance/no adnexal mass. Pulsed Doppler evaluation of both ovaries demonstrates normal low-resistance arterial and venous waveforms. Other findings Trace free fluid is seen in the pelvic cul-de-sac. The fluid appears uncomplicated. IMPRESSION: 1. No evidence of ovarian torsion. 2. 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 3. 2.5 cm hemorrhagic cyst in the right ovary. 4. Normal appearance of the uterus and left ovary. 5. Trace cul-de-sac anechoic fluid. Electronically Signed   By: Francis Quam M.D.   On: 08/29/2024 05:09     Discharge Instructions: Discharge Instructions     Call MD for:  persistant dizziness or light-headedness   Complete by: As directed    Call  MD for:  persistant dizziness or light-headedness   Complete by: As directed    Call MD for:  persistant nausea and vomiting   Complete by: As directed    Call MD for:  persistant nausea and vomiting   Complete by: As directed    Call MD for:  redness, tenderness, or signs of infection (pain, swelling, redness, odor or green/yellow discharge around incision site)   Complete by: As directed    Call MD for:  redness, tenderness, or signs of infection (pain, swelling, redness, odor or green/yellow discharge around incision site)   Complete by: As directed    Call MD for:  severe uncontrolled pain   Complete by: As directed    Call MD for:  severe uncontrolled pain   Complete by: As directed    Call MD for:  temperature >100.4   Complete by: As directed    Call MD for:  temperature >100.4   Complete by: As directed    Diet - low sodium heart healthy   Complete by: As directed    Diet general   Complete by: As directed    Discharge instructions   Complete  by: As directed    You were admitted to the hospital for abdominal pain and were found to have pelvic inflammatory disease (PID), ovarian cysts, and a urinary tract infection (UTI). You were treated with IV antibiotics, transitioned to oral medications, and your condition improved.  Medications Take doxycycline 100 mg by mouth twice daily until the course is completed. Metronidazole  500 mg by mouth twice dailyuntil the course is completed. Acetaminophen  (Tylenol ) for pain/fever Ibuprofen  or oxycodone  PRN for pain Finish all prescribed antibiotics even if you feel better  Diet Regular diet as tolerated Drink plenty of fluids to stay hydrated Avoid alcohol while taking metronidazole  (can cause severe reaction)  Activity Light activity as tolerated Avoid sexual intercourse until antibiotics are finished and cleared by OB/GYN Encourage rest to allow recovery  Follow-up OB/GYN follow-up appointment within 1-2 weeks Notify your partner(s) about your diagnosis so they can be tested/treated Primary Care follow-up for ongoing health needs  When to Seek Help Immediately Severe or worsening abdominal/pelvic pain not relieved by medications Fever >101F or chills Heavy vaginal bleeding (soaking >1 pad/hour or passing large clots) Foul-smelling vaginal discharge Persistent nausea/vomiting preventing you from keeping fluids or medications down Dizziness, fainting, or new difficulty urinating  It was a pleasure taking care of you, and we wish you a safe recovery.   Increase activity slowly   Complete by: As directed    Increase activity slowly   Complete by: As directed        Signed: Bernadine Manos, MD 08/31/2024, 2:06 PM

## 2024-08-31 NOTE — Discharge Instructions (Addendum)
 You were admitted to the hospital for abdominal pain and were found to have pelvic inflammatory disease (PID), ovarian cysts, and a urinary tract infection (UTI). You were treated with IV antibiotics, transitioned to oral medications, and your condition improved.  Medications Take doxycycline 100 mg by mouth twice daily until the course is completed. Metronidazole  500 mg by mouth twice dailyuntil the course is completed. Acetaminophen  (Tylenol ) for pain/fever Ibuprofen  or oxycodone  PRN for pain Finish all prescribed antibiotics even if you feel better  Diet Regular diet as tolerated Drink plenty of fluids to stay hydrated Avoid alcohol while taking metronidazole  (can cause severe reaction)  Activity Light activity as tolerated Avoid sexual intercourse until antibiotics are finished and cleared by OB/GYN Encourage rest to allow recovery  Follow-up OB/GYN follow-up appointment within 1-2 weeks Notify your partner(s) about your diagnosis so they can be tested/treated Primary Care follow-up for ongoing health needs  When to Seek Help Immediately Severe or worsening abdominal/pelvic pain not relieved by medications Fever >101F or chills Heavy vaginal bleeding (soaking >1 pad/hour or passing large clots) Foul-smelling vaginal discharge Persistent nausea/vomiting preventing you from keeping fluids or medications down Dizziness, fainting, or new difficulty urinating  It was a pleasure taking care of you, and we wish you a safe recovery.

## 2024-08-31 NOTE — Plan of Care (Signed)

## 2025-01-01 ENCOUNTER — Ambulatory Visit: Payer: Self-pay | Admitting: Obstetrics and Gynecology

## 2025-01-05 ENCOUNTER — Ambulatory Visit: Admitting: Obstetrics and Gynecology
